# Patient Record
Sex: Female | Born: 1975 | Race: Black or African American | Hispanic: No | Smoking: Never smoker
Health system: Southern US, Community
[De-identification: ages and names within clinical notes are randomized; demographics above are authoritative.]

## PROBLEM LIST (undated history)

## (undated) DIAGNOSIS — I2699 Other pulmonary embolism without acute cor pulmonale: Secondary | ICD-10-CM

## (undated) DIAGNOSIS — M329 Systemic lupus erythematosus, unspecified: Secondary | ICD-10-CM

## (undated) DIAGNOSIS — G8929 Other chronic pain: Secondary | ICD-10-CM

## (undated) DIAGNOSIS — K219 Gastro-esophageal reflux disease without esophagitis: Secondary | ICD-10-CM

## (undated) DIAGNOSIS — IMO0002 Reserved for concepts with insufficient information to code with codable children: Secondary | ICD-10-CM

## (undated) HISTORY — PX: PERICARDIAL WINDOW: SHX2213

---

## 2000-07-31 ENCOUNTER — Inpatient Hospital Stay (HOSPITAL_COMMUNITY): Admission: AD | Admit: 2000-07-31 | Discharge: 2000-07-31 | Payer: Self-pay | Admitting: Obstetrics & Gynecology

## 2000-08-02 ENCOUNTER — Encounter (INDEPENDENT_AMBULATORY_CARE_PROVIDER_SITE_OTHER): Payer: Self-pay

## 2000-08-02 ENCOUNTER — Inpatient Hospital Stay (HOSPITAL_COMMUNITY): Admission: AD | Admit: 2000-08-02 | Discharge: 2000-08-04 | Payer: Self-pay | Admitting: *Deleted

## 2006-01-25 ENCOUNTER — Encounter: Payer: Self-pay | Admitting: Internal Medicine

## 2006-01-26 ENCOUNTER — Ambulatory Visit: Payer: Self-pay | Admitting: *Deleted

## 2006-01-26 ENCOUNTER — Inpatient Hospital Stay (HOSPITAL_COMMUNITY): Admission: EM | Admit: 2006-01-26 | Discharge: 2006-02-03 | Payer: Self-pay | Admitting: Emergency Medicine

## 2006-01-26 ENCOUNTER — Ambulatory Visit: Payer: Self-pay | Admitting: Oncology

## 2006-01-26 ENCOUNTER — Ambulatory Visit: Payer: Self-pay | Admitting: Pulmonary Disease

## 2006-01-26 ENCOUNTER — Encounter: Payer: Self-pay | Admitting: Cardiology

## 2006-01-27 ENCOUNTER — Encounter (INDEPENDENT_AMBULATORY_CARE_PROVIDER_SITE_OTHER): Payer: Self-pay | Admitting: Specialist

## 2006-01-27 ENCOUNTER — Encounter (INDEPENDENT_AMBULATORY_CARE_PROVIDER_SITE_OTHER): Payer: Self-pay | Admitting: *Deleted

## 2006-02-12 ENCOUNTER — Encounter
Admission: RE | Admit: 2006-02-12 | Discharge: 2006-02-12 | Payer: Self-pay | Admitting: Thoracic Surgery (Cardiothoracic Vascular Surgery)

## 2006-02-13 ENCOUNTER — Ambulatory Visit: Payer: Self-pay | Admitting: Internal Medicine

## 2009-03-05 ENCOUNTER — Ambulatory Visit (HOSPITAL_COMMUNITY): Admission: RE | Admit: 2009-03-05 | Discharge: 2009-03-05 | Payer: Self-pay | Admitting: Internal Medicine

## 2010-08-13 ENCOUNTER — Emergency Department (HOSPITAL_COMMUNITY)
Admission: EM | Admit: 2010-08-13 | Discharge: 2010-08-13 | Payer: Self-pay | Source: Home / Self Care | Admitting: Emergency Medicine

## 2010-08-14 ENCOUNTER — Emergency Department (HOSPITAL_COMMUNITY)
Admission: EM | Admit: 2010-08-14 | Discharge: 2010-08-14 | Payer: Self-pay | Source: Home / Self Care | Admitting: Emergency Medicine

## 2010-08-14 LAB — BASIC METABOLIC PANEL
BUN: 7 mg/dL (ref 6–23)
CO2: 26 mEq/L (ref 19–32)
Calcium: 9.1 mg/dL (ref 8.4–10.5)
Chloride: 105 mEq/L (ref 96–112)
Creatinine, Ser: 0.82 mg/dL (ref 0.4–1.2)
GFR calc Af Amer: >60 mL/min (ref >60)
GFR calc non Af Amer: >60 mL/min (ref >60)
Glucose, Bld: 87 mg/dL (ref 70–99)
Potassium: 3.6 mEq/L (ref 3.5–5.1)
Sodium: 139 mEq/L (ref 135–145)

## 2010-09-01 ENCOUNTER — Encounter: Payer: Self-pay | Admitting: Thoracic Surgery (Cardiothoracic Vascular Surgery)

## 2010-10-21 LAB — DIFFERENTIAL
Eosinophils Relative: 0 % (ref 0–5)
Lymphocytes Relative: 8 % — ABNORMAL LOW (ref 12–46)
Lymphs Abs: 2 10*3/uL (ref 0.7–4.0)
Monocytes Absolute: 0.7 10*3/uL (ref 0.1–1.0)
Monocytes Relative: 3 % (ref 3–12)
WBC Morphology: INCREASED

## 2010-10-21 LAB — CK TOTAL AND CKMB (NOT AT ARMC)
CK, MB: 0.3 ng/mL (ref 0.3–4.0)
Relative Index: INVALID (ref 0.0–2.5)
Total CK: 47 U/L (ref 7–177)

## 2010-10-21 LAB — CULTURE, BLOOD (ROUTINE X 2)
Culture: NO GROWTH
Culture: NO GROWTH

## 2010-10-21 LAB — URINALYSIS, ROUTINE W REFLEX MICROSCOPIC
Bilirubin Urine: NEGATIVE
Ketones, ur: NEGATIVE mg/dL
Nitrite: NEGATIVE
Specific Gravity, Urine: 1.02 (ref 1.005–1.030)
pH: 6 (ref 5.0–8.0)

## 2010-10-21 LAB — COMPREHENSIVE METABOLIC PANEL
Albumin: 3.5 g/dL (ref 3.5–5.2)
Alkaline Phosphatase: 65 U/L (ref 39–117)
BUN: 9 mg/dL (ref 6–23)
Chloride: 101 mEq/L (ref 96–112)
Glucose, Bld: 89 mg/dL (ref 70–99)
Potassium: 3.4 mEq/L — ABNORMAL LOW (ref 3.5–5.1)
Total Bilirubin: 0.7 mg/dL (ref 0.3–1.2)

## 2010-10-21 LAB — CBC
HCT: 35.4 % — ABNORMAL LOW (ref 36.0–46.0)
Hemoglobin: 11.6 g/dL — ABNORMAL LOW (ref 12.0–15.0)
MCV: 84.3 fL (ref 78.0–100.0)
RBC: 4.2 MIL/uL (ref 3.87–5.11)
WBC: 25.5 10*3/uL — ABNORMAL HIGH (ref 4.0–10.5)

## 2010-10-21 LAB — URINE CULTURE
Colony Count: NO GROWTH
Culture  Setup Time: 201201031302
Culture: NO GROWTH

## 2010-10-21 LAB — LACTIC ACID, PLASMA: Lactic Acid, Venous: 1.5 mmol/L (ref 0.5–2.2)

## 2010-10-21 LAB — PREGNANCY, URINE: Preg Test, Ur: NEGATIVE

## 2010-12-27 NOTE — Op Note (Signed)
NAMEBRYAR, RENNIE NO.:  [id number]   MEDICAL RECORD NO.:  [id number]          PATIENT TYPE:  INP   LOCATION:  2918                         FACILITY:  [facility]   PHYSICIAN:  Salvatore Decent. Dorris Fetch, M.D.DATE OF BIRTH:  [birth year]   DATE OF PROCEDURE:  01/27/2006  DATE OF DISCHARGE:                                 OPERATIVE REPORT   PREOPERATIVE DIAGNOSIS:  Pericardial effusion.   POSTOPERATIVE DIAGNOSIS:  Pericardial effusion.   PROCEDURE:  Subxiphoid pericardial window.   SURGEON:  Salvatore Decent. Dorris Fetch, M.D.   ASSISTANT:  Leodis Sias, RNFA   ANESTHESIA:  General.   FINDINGS:  No hemodynamic changes with fluid drainage.  Pericardium was  relatively normal appearing.  Clear serous fluid, 400 mL drained.   CLINICAL NOTE:  Elizabeth Baxter is a 35 year old female with a history of lupus  who was admitted with shortness of breath and chest discomfort.  A CT was  done to rule out pulmonary embolism and showed a large pericardial effusion.  She subsequently had an echocardiogram which showed a large pericardial  effusion with some suggestion of early tamponade physiology.  The patient  did not have hemodynamic signs of pericardial tamponade.  She also had a  question of pneumonia on her CT scan.  The patient was referred for  subxiphoid pericardial window for diagnostic and therapeutic purposes.  The  indications, benefits, benefits and alternatives were discussed with the  patient and her family.  She understood and accepted the risks of surgery  and agreed to proceed.   OPERATIVE NOTE:  Elizabeth Baxter was brought to the operating room on the morning  of January 27, 2006.  The patient had been intubated the night before.  She was  tachycardic but otherwise hemodynamically stable.  She was already on  fentanyl and Versed drips and anesthesia was monitored by the anesthesia  personnel.  The patient was on vancomycin and meropenem.  After ensuring  adequate general  anesthesia, the chest and abdomen were prepped and draped  in the usual fashion.  Incision was made over the xiphoid process  approximately 8 cm in length.  It was carried down through skin and  subcutaneous tissue.  Hemostasis was achieved with electrocautery.  The  xiphoid process was identified.  It was primarily cartilaginous and a  portion of it was excised to improve exposure.  A lymph node was  encountered.  This was edematous but otherwise unremarkable.  It was sent  for both pathology and cultures.  The diaphragmatic reflection of the  pericardium was grasped with a Babcock clamp and retracted.  There was upper  retraction placed on the remainder of the xiphoid process.  The pericardium  was exposed.  The pericardium was incised.  Four-hundred mL of clear serous  fluid was evacuated.  There was no change in the patient's hemodynamics,  either blood pressure or heart rate with drainage of fluid.  An  approximately 2 cm square section of the pericardium was excised and sent  for both cultures and pathology.  The fluid drained was sent for cultures,  serologies, cytologies,  cell counts and chemistries.  The Yankauer suction  tip was placed into the pericardium and gently probed throughout the entire  pericardium to ensure there were no loculated effusions.  A 32-French Blake  drain was placed into the pericardium along the diaphragmatic surface and  brought out through a separate incision and secured with a #1 silk suture.  The incisions was close in standard fashion.  A zero Vicryl suture was used  for the fascia, 2-0 Vicryl for the subcutaneous tissue and a 3-0 Vicryl for  the subcuticular suture.  All sponge, needle and instrument counts were  correct at the end of the procedure.  The patient remained in fair condition  throughout the procedure with no significant changes.  She was taken from  the operating room to the CCU intubated and in fair condition.            ______________________________  Salvatore Decent Dorris Fetch, M.D.     SCH/MEDQ  D:  01/27/2006  T:  01/27/2006  Job:  [id number]   cc:   Olga Millers, M.D. [facility]  [address]  [address]  [place]  [place] [address]   Danice Goltz, M.D. [facility]  [address] [place], [place] [address]

## 2010-12-27 NOTE — Consult Note (Signed)
NAMEANAVI, BRANSCUM NO.:  [id number]   MEDICAL RECORD NO.:  [id number]          PATIENT TYPE:  INP   LOCATION:  2918                         FACILITY:  [facility]   PHYSICIAN:  Leighton Roach. Truett Perna, M.D. DATE OF BIRTH:  1976/04/11   DATE OF CONSULTATION:  01/26/2006  DATE OF DISCHARGE:                                   CONSULTATION   HEMATOLOGY CONSULTATION NOTE.   PATIENT IDENTIFICATION:  Ms. Gammell is a 35 year old admitted with  respiratory distress and chest pain.  We were consulted to evaluate anemia  and a positive antibody screen.   HISTORY OF PRESENT ILLNESS:  Ms. Graciano reports a history of systemic lupus  erythematosus, dating to 2001.  She is maintained on prednisone and  Plaquenil for treatment of arthralgias and skin disease.   She presented to the emergency room on June 18 with chest pain progressing  over a few days.   A chest x-ray in the emergency room revealed changes of congestive heart  failure versus infection.  A CT scan of the chest was remarkable for  pulmonary edema, bilateral effusions, multifocal airspace disease, and a  large pericardial effusion.   An echocardiogram confirmed a pericardial effusion with early RV diastolic  collapse.  She was seen by Dr. Dorris Fetch and is scheduled to undergo a  pericardial window procedure on June 19.   The admission laboratory screen was remarkable for a hemoglobin of 8.8,  platelets 524,000, white count 14,000, ANC 12.4, MCV 82.3, and the RDW  returned at 18.3%.   A type and screen was sent to the blood bank.  This confirmed the blood type  to be O positive.  The antibody screen was positive.  A DAT was positive for  a warm antibody and complement.   She reports no history of anemia.  She has taken iron in the past.   PAST MEDICAL HISTORY:  1.  SLE.  2.  G2, P1, 1 miscarriage.   PAST SURGICAL HISTORY:  None.   ALLERGIES:  TRAMADOL causes pruritus.   MEDICATIONS ON ADMISSION:  1.  Sudafed  p.r.n.  2.  Hydrochlorothiazide 25 mg daily.  3.  Naprosyn 500 mg b.i.d.  4.  Prednisone 10 mg daily.  5.  Plaquenil 100 mg daily.   FAMILY HISTORY:  Noncontributory.   SOCIAL HISTORY:  She lives in [place].  She is in [place] visiting  family.  She denies tobacco and alcohol use.  She denies risk factors for  HIV and hepatitis.  She has no transfusion history.   REVIEW OF SYSTEMS:  CONSTITUTIONAL:  She reports a fever for several days  prior to hospital admission.  She reports anorexia for several days.  Prior  to the past few days, she had a good appetite.  She denies weight loss.  RESPIRATORY:  She has a cough and dyspnea.  CARDIAC:  She presented with  substernal chest pain.  GU:  Negative.  GI:  Negative.  NEUROLOGIC:  Negative.  SKIN:  Stable rash over the trunk and extremities.  She relates  the rash to lupus.  PHYSICAL EXAMINATION:  VITAL SIGNS:  Temperature 98.1, pressure 110/70,  pulse 120, oxygen saturation 91% on a 50% mask.  HEENT:  The sclerae are anicteric.  No thrush.  LUNGS:  Diffuse inspiratory rales throughout the posterior lung fields and  the upper anterior lung fields.  CARDIAC:  Tachycardia.  Regular rhythm.  ABDOMEN:  No hepatosplenomegaly.  EXTREMITIES:  No edema.  LYMPH NODES:  There are shotty bilateral cervical and inguinal nodes; 1-  cm, mobile, bilateral axillary nodes.  SKIN:  There are raised, hyperpigmented lesions at the dorsum of the  extremities.  There is confluent erythema at the right abdomen and low  anterior chest.   Labs from June 18:  PT 14.5, PTT 35, D-dimer 3.34.  Sodium 132, BUN 13,  creatinine 0.9, bilirubin 0.7, AST 84, ALT 25, total protein 8.3, albumin  2.4, calcium 8.2.  LDH 530. CPK 1440.  C3 60, C4 11.  TSH 4.1.   Review of the peripheral blood smear:  There is a marked leukocytosis.  The  majority of the neutrophils are mature polys.  There are numerous band forms  and occasional myelocytes.  The polychromasia is  increased.  There are a few  teardrop forms and ovalocytes and microcytes.  No spherocytes.  The  platelets appear normal in number.   IMPRESSION:  1.  History of systemic lupus erythematosus.  2.  Large pericardial effusion.  3.  Bilateral parenchymal lung infiltrates versus edema.  4.  Anemia.  5.  Positive direct antiglobulin testing for IgG and complement.  6.  Neutrophilia.  7.  Chest pain.  8.  Skin changes secondary to systemic lupus erythematosus.   Ms. Martinez is admitted with a large pericardial effusion and hypoxia related  to pulmonary infiltrates versus edema.   She has anemia.  The anemia is most likely related to chronic disease or  iron deficiency.   The differential diagnosis includes an autoimmune hemolytic anemia.  I do  not see peripheral blood smear changes (spherocytes) to suggest ongoing  hemolysis.   The positive DAT for IgG and complement is likely related to systemic  inflammation in the setting of SLE.  The positive DAT does not necessarily  indicate significant hemolysis.   She can be transfused with ABO/Rh-compatible blood as needed.  This is  generally safe, withstanding the small chance of a transfusion reaction to  an unrecognized alloantibody.   The lymphadenopathy on the chest CT and physical exam is most likely related  to systemic inflammation in the setting of SLE.  I have a low suspicion for  a hematopoietic malignancy at present.   RECOMMENDATIONS:  1.  Transfuse least incompatible RBCs as indicated per cardiology/CVTS.  2.  Management of the symptomatic pericardial effusion per CVTS.  3.  Consider broad-spectrum antibiotics, including treatment for atypical      infections, and pulmonary consultation for bronchoscopy/pulmonary biopsy      pending results from the planned pericardial window procedure.  4.  Management of SLE per rheumatology. 5.  Additional evaluation of the anemia, to include serum iron studies, a      reticulocyte  count, and serum haptoglobin level.           ______________________________  Leighton Roach. Truett Perna, M.D.     GBS/MEDQ  D:  01/26/2006  T:  01/27/2006  Job:  [id number]   cc:   Olga Millers, M.D. [facility]  [address]  [address]  [place]  [place] [address]   Salvatore Decent.  Dorris Fetch, M.D.  [address]  [place]  [place] [address]

## 2010-12-27 NOTE — H&P (Signed)
Elizabeth Baxter, Elizabeth Baxter NO.:  [id number]   MEDICAL RECORD NO.:  [id number]          PATIENT TYPE:  INP   LOCATION:  2011                         FACILITY:  [facility]   PHYSICIAN:  Denyse Amass, MD DATE OF BIRTH:  [birth year]   DATE OF ADMISSION:  01/26/2006  DATE OF DISCHARGE:                                HISTORY & PHYSICAL   CHIEF COMPLAINT:  Chest pain.   HISTORY OF PRESENT ILLNESS:  A 35 year old female with lupus who presents  with a 24-hour sensation of somebody stepping on her chest when she lays  down.  Is improved in an upright position.  Her symptoms started three days  ago.  However, it was initially intermittent.  She noted chills, called her  primary care physician who started antibiotics for presumed upper  respiratory tract infection.  Some of her symptoms progressed until the  above described sensation for the last 24 hours.  She denies syncope.  She  has occasional complications.  She denies every having these symptoms  before.   PAST MEDICAL HISTORY:  1.  Lupus diagnosed in 2001, currently treated with prednisone and      Plaquenil.  2.  She has been pregnant twice, the most recent in 2005, resulting in a      miscarriage.  She has one 48-year-old daughter.   PAST SURGICAL HISTORY:  None.   ALLERGIES:  TRAMADOL causes itching.   MEDICATIONS:  1.  Pseudoephedrine p.r.n.  2.  Hydrochlorothiazide 25 mg.  3.  Naprosyn 500 mg.  4.  Prednisone 10 mg.  5.  Plaquenil 100 mg.  6.  In the emergency room, she received ceftriaxone, Dilaudid and Lasix.   SOCIAL HISTORY:  She lives on [place].  She is here in [place] visiting  her parents.   FAMILY HISTORY:  Unremarkable.   SOCIAL HISTORY:  She denies tobacco, alcohol or IV drug abuse.   REVIEW OF SYSTEMS:  Otherwise, negative besides as mentioned in the history  and present illness.   PHYSICAL EXAMINATION:  VITAL SIGNS:  Temperature 97.9, pulse 112,  respirations 18, blood pressure  112/61, pulse oximetry 94% on room air.  A  pulsus paradoxus was measured at 10 mmHg.  GENERAL APPEARANCE:  She is age appropriate.  She looks uncomfortable.  HEENT:  Normocephalic, atraumatic.  Pupils equal, round and reactive to  light and accommodation.  Extraocular motion intact.  __________clear.  Mucous membranes are dry.  Dentition is fair.  NECK:  No evidence of meningismus.  Jugular venous pulsations are elevated  past the level of the earlobe.  She has sharp A and B waves with prominent X  and Y descents.  There is shiny axillary lymphadenopathy bilaterally.  CARDIOVASCULAR:  A quite precordium with an appropriately placed PMI and no  thrills or heaves.  S1 and S2 are regular, mildly tachycardic at  approximately 110 beats per minute.  S4 is appreciated.  Pulses in the  carotid, radial and dorsalis pedis positions are 2+ in intensity and normal  wave form.  LUNGS:  Crackles bilaterally and bilateral dullness to percussion, right  greater than left.  SKIN:  No rashes or lesions.  ABDOMEN:  Soft and nontender.  No rebound or guarding.  Liver is within  normal limits.  RECTAL:  Not performed.  EXTREMITIES:  No cyanosis, clubbing or edema.  No lesions or petechiae are  noted.  MUSCULOSKELETAL:  No joint deformities and no effusions.  NEUROLOGICAL:  The patient is alert and oriented x3 with cranial nerves II-  XII grossly intact.  Strength is 5/5 in all extremities and all axial  groups.  Normal sensation throughout with normal cerebellar function.   STUDIES:  Chest x-ray is pending.  A CT scan was performed at [facility],  which per report, showed pulmonary edema and a large pericardial effusion.  EKG here shows sinus tachycardia at 127 beats per minute with low voltage in  the limb leads and nonspecific ST changes.   LABORATORY DATA:  White count 12.5, hematocrit 28.1, platelets 576,000.  Sodium 133, potassium 4.1, chloride 102, bicarbonate 25, BUN 14, creatinine  0.9,  glucose 103, AST 92, ALT 29, alk-phos 69.  First troponin 0.17, second  troponin 0.3, initial CK 1397, MB 15.7, ABG shows pH 7.41, PCO2 37, PO2 66,  bicarbonate 23.4, saturations 91.4%, D. dimer elevated at 3.34.   ASSESSMENT/PLAN:  This is a 35 year old woman with a history of SLE who  presents with a three day history of chest pain relieved by standing  upright, pericardial effusion noted on CT scan, leukocytosis, tachycardia  with low voltage and EKG.  Her blood pressure remains stable and her  tachycardia has improved with analgesia to approximately 100-105.  As  instructed, the patient is having a lupus flare complicated by pericarditis,  perhaps with myocardial involvement as well.  While the patient clearly has  a pericardial effusion, she has equivocal and clinical findings of tamponade  pulsus paradoxus that is mildly increased.  Her tachycardia is improving.  However, she does have an oxygen requirement.  We will admit the patient to  the hospital.  We will get an echocardiogram shortly to assess for the  hemodynamic significance of the effusion that has been observed.  Will  initiate IV steroids, IV analgesia.  Currently there is no evidence of  tamponade.  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      Denyse Amass, MD  Electronically Signed     DBH/MEDQ  D:  01/26/2006  T:  01/26/2006  Job:  [id number]

## 2010-12-27 NOTE — Procedures (Signed)
NAMEMACKENZEY, CROWNOVER NO.:  [id number]   MEDICAL RECORD NO.:  [id number]          PATIENT TYPE:  EMS   LOCATION:  ED                            FACILITY:  [facility]   PHYSICIAN:  Edward L. Juanetta Gosling, M.D.DATE OF BIRTH:  [birth year]   DATE OF PROCEDURE:  DATE OF DISCHARGE:                                EKG INTERPRETATION   The rhythm is sinus tachycardia with a rate of about 130.  There is possible  left atrial enlargement.  There is poor R-wave progression across the  precordium which could indicate a previous infarction or could be related to  pulmonary disease, etc.  There are T-wave abnormalities which are  nonspecific.  Abnormal electrocardiogram.      Oneal Deputy. Juanetta Gosling, M.D.  Electronically Signed     ELH/MEDQ  D:  01/26/2006  T:  01/26/2006  Job:  [id number]

## 2010-12-27 NOTE — Discharge Summary (Signed)
[facility]  Patient:    Elizabeth Baxter, Elizabeth Baxter                         MRN: [id number] Adm. Date:  [id number] Disc. Date: 08/04/00 Attending:  Michaelle Copas Dictator:   Kinnie Scales. Reed Breech, M.D. CC:         Nettie Elm, M.D. [facility], [place], [place]   Discharge Summary  DISCHARGE DIAGNOSES:          1. Normal spontaneous vaginal deliver.                               2. Lupus.                               3. Positive group beta Streptococcus treated                                  with antibiotics.                               4. Positive Chlamydia treated with Suprax and                                  azithromycin.  DISCHARGE MEDICATIONS:        1. Baby aspirin 81 mg p.o. q.d.                               2. Prenatal vitamins, one p.o. q.d.                               3. ______ 600 mg, one p.o. q.6h. p.r.n. pain.                               4. Birth control in the form of Depo-Provera.  ADMISSION HISTORY AND PHYSICAL:                 The patient is a pleasant 35 year old African-American female, gravida 1, para 0 at 36-5/7 weeks by last menstrual period and ______ by a 17-week ultrasound.  She presents with increased uterine activity and increased intensity of contractions, but no rupture of membranes on December 23.  The patient has been noted in the past to be positive ANA, positive VDRL, but negative STA.  DAILY MEDS:                   1. Heparin 5000 units subcu b.i.d.                               2. Baby aspirin.                               3. Iron.  4. Prenatal vitamins.  PHYSICAL EXAMINATION:         (In the emergency room).  ABDOMEN:                      The patient was having contractions every 3-1/2 to 4 minutes.  The patient had positive decelerations and fetal heart rate of 140-145.  VITAL SIGNS:                  Temperature 97.5, blood pressure 121/81,  pulse 87.  PELVIC:                       Her cervical exam was remarkable for a 5 cm cervix, 70% effacement, -2 station and vertex presentation.  PLAN:                         The patient was admitted to labor and delivery for expectant management.  The patient was noted to have positive Chlamydia. She was treated with azithromycin p.o. x 1 along with Suprax.  The patient will undergo Pitocin augmentation if contractions slowed.  HOSPITAL COURSE:              1. OB.  The patient had an epidural placement. The patient was noted to have some variables as well as some deep variables and, thus, an IUPC was placed and amnio infusion started.  The patient then delivered, at 2307 on August 02, 2000, a healthy, viable female infant.  No lacerations were noted.  Estimated blood loss was less than 200 cc.  The baby and the mother were in stable condition.  Postpartum, the patients hemoglobin was 9.5.                                2. Contraception.  The patient would like Depo-Provera prior to discharge.  This will be given IM.                                3. Nutrition for baby.  The mother started breast feeding.  However, it was worried that the baby was not getting adequate nutrition, and it was decided to switch to bottle feeding.  The baby has done well with this.  DISCHARGE HISTORY AND PHYSICAL:                 The patient is doing well today.  She is stable to ambulate in the hall.  Some abdominal discomfort that resolves with pain medications.  DISPOSITION:                  The patient reports that she will follow up with her primary obstetrician in [place], [place], as well as her rheumatologist, as there is the possibility of a flare of lupus postpartum. The patient underwent a routine postpartum course and will be discharged today, August 04, 2000 in stable condition. DD:  08/04/00 TD:  08/04/00 Job: [id number]

## 2010-12-27 NOTE — Discharge Summary (Signed)
Elizabeth Baxter, ALCINDOR NO.:  [id number]   MEDICAL RECORD NO.:  [id number]          PATIENT TYPE:  INP   LOCATION:  5734                         FACILITY:  [facility]   PHYSICIAN:  Shan Levans, M.D. LHCDATE OF BIRTH:  18-Apr-1976   DATE OF ADMISSION:  01/26/2006  DATE OF DISCHARGE:  02/03/2006                                 DISCHARGE SUMMARY   DISCHARGE DIAGNOSES:  1.  Systemic lupus erythematosus (SLE) flare with pleuro/pericarditis and      pneumonitis with positive response to high-dose Solu-Medrol.  2.  Acute respiratory failure with ventilator-dependent respiratory failure      secondary to #1 (resolved).  3.  Anemia.  4.  Hyperglycemia.   LABORATORY DATA:  February 03, 2006:  Sodium 135, potassium 3.9, chloride 109,  CO2 26, glucose 116, BUN 6, creatinine 0.6, alkaline phosphatase 38, AST 52,  ALT 30, albumin 2.2.  February 03, 2006:  Phosphorous 3.3.  February 03, 2006:  Magnesium 2.2.  February 03, 2006:  White blood cells 18.2, hemoglobin 8.7,  hematocrit 26.1, platelets 758.  January 26, 2006:  Blood cultures x2 were  negative (final data).  January 27, 2006:  ANA by IFA, IgG finding high degree  of nonspecific fluorescence observed (indeterminate).  January 27, 2006:  __________ with no growth to date.  January 27, 2006:  CMV PCR of urine.  Results not detected.   RADIOLOGY:  February 02, 2006:  Chest x-ray:  Cardiomegaly with improved  perihilar congestion and resolving bibasilar air space disease.   BRIEF HISTORY:  This is a 35 year old African-American female who was  diagnosed with SLE about six years ago.  She presented in transfer from  [facility] on January 26, 2006 with a diagnosis of pericardial  effusion and chest pain.  She had had progressive increased FiO2  requirements since her time of admission.  Prior to being seen in the  hospital she reported approximately two-week history of dyspnea on exertion  with productive cough with greenish colored sputum.   Subjectively complaint  of questionable fevers.  About one to two days prior to admission started  feeling heaviness in chest, especially when she would lie down.  She was  taking p.o. steroids and Plaquenil in the outpatient setting.  She had  apparently been treated with one round of antibiotics by her primary care  physician in [place] prior to admission.  Pulmonary critical care team  was asked to see in consultation and eventually picked her up on primary  care service from the cardiology team in regards to whether or not her  dyspnea was secondary to inflammation versus an acute infective state.   HOSPITAL COURSE:  #1 - SLE FLARE WITH PLEURO/PERICARDITIS AND PNEUMONITIS  WITH POSITIVE RESPONSE TO HIGH-DOSE SOLU-MEDROL:  Ms. Oguinn was actually  admitted to the coronary intensive care under the primary cardiology service  and then later to the pulmonary critical care service.  She underwent  pericardial window with biopsy on January 27, 2006 with fluids for cytology and  diagnostic PCR, ADA, and cultures sent.  Diagnostic evaluation by  rheumatology and Dr. Delford Field demonstrated no evidence of an opportunistic  infection or infection in general.  Prior to this she was empirically  covered with vancomycin and Primaxin.  Ms. Dearing did have transient  hypotension primarily from probable tamponade physiology from pericardial  effusion.  This was clinically improved after pericardial window and she  slowly continued to improve after this.  Dr. Phylliss Bob was consulted in the  inpatient setting and it was his impression as well that this was indeed  secondary to SLE flare.  Ms. Hornik was treated with high-dose steroids with  dramatic improvement over the course of her hospitalization.  On February 03, 2006 with decreased dyspnea, decreased cough, and no chest pain is currently  ambulating in the hall.  Room air saturations in the 100% range.  She will  be followed once by Dr. Phylliss Bob in the outpatient  setting and then be referred  back to her primary rheumatologist, Dr. Claudette Laws in [place] for further  management of her SLE disease.  From a pneumonitis standpoint as well as  pericarditis standpoint she was see Dr. Sherene Sires in follow-up next week for  reevaluation of chest x-ray and then Dr. Dorris Fetch as well next week for  follow-up pericardial window and reevaluation of postoperative films as  well.  At that time she will also need sutures removed from anterior  mediastinal chest tube placement.   #2 - RESPIRATORY FAILURE SECONDARY TO SLE FLARE AND PNEUMONITIS:  Resolved  after high-dose Solu-Medrol with 1 g IV x3 days now on prednisone fixed dose  at 60 mg daily until directed otherwise by rheumatology.  She did require  endotracheal intubation for her acute renal failure.  She was intubated on  January 26, 2006 and successfully extubated on January 29, 2006 and since that  time has been weaned to room air support.  Follow-up particularly with Dr.  Sherene Sires in the outpatient setting for follow-up of pneumonitis on chest x-ray.   #3 - ANEMIA:  Treated inpatient on epoetin and iron.  Upon time of discharge  her hemoglobin is currently 8.7.  Her lowest hemoglobin during this  hospitalization was measured at 7.5 on January 29, 2006.  She will be sent home  on oral iron with follow-up in the outpatient setting.   #4 - HYPERGLYCEMIA SECONDARY TO HIGH-DOSE SOLU-MEDROL:  Much improved on  oral prednisone and currently in 158-188 region.  This was managed with  sliding scale insulin in inpatient setting, will need to be reevaluated by  her primary care physician with a question of insulin in the outpatient  setting.  However, she will not be sent home on insulin at this time given  the short duration of high-dose prednisone anticipated.   DISCHARGE INSTRUCTIONS:   MEDICATIONS:  1.  Ferrous sulfate 325 mg tablet one tablet three times a day.  2.  Pepcid 20 mg tablet one tablet twice a day. 3.   Prednisone 10 mg tablet six tablets daily.   FOLLOW-UP APPOINTMENT:  Dr. Estill Bakes on February 12, 2006 at 8:45 a.m.  Dr.  Charlett Lango on February 12, 2006 at 12 noon.  Dr. Sandrea Hughs on February 13, 2006 at 10:50 a.m.  She will have follow-up chest x-ray with Dr. Sherene Sires on  February 13, 2006 for follow-up of pneumonitis.   PHYSICAL EXAMINATION UPON TIME OF DISCHARGE:  VITAL SIGNS:  Temperature  98.3, heart rate 87, respirations 19-20, saturations 98% on room air, blood  pressure 113/69.  HEENT:  Right  anterior neck CVL insertion site well healed.  PULMONARY:  Faint posterior rales.  CARDIAC:  Regular rate and rhythm.  CHEST:  Anterior mediastinal chest tube site with sutures intact.  Incision  from pericardial window well approximated without drainage.  ABDOMEN:  Soft, nontender.  EXTREMITIES:  Without edema.  Chest x-ray clearing with decreased infiltrates bilaterally.  NEUROLOGIC:  Grossly intact.   DISPOSITION:  Currently ready for discharge for a follow-up with the above-  noted physicians.   ACTIVITY:  As tolerated.  She will have outpatient physical therapy as well  as follow-up in the outpatient setting.      Anders Simmonds, N.P. [facility]      Shan Levans, M.D. [facility]  Electronically Signed    PB/MEDQ  D:  02/03/2006  T:  02/03/2006  Job:  [id number]   cc:   Salvatore Decent. Dorris Fetch, M.D.  [address]  [place]  [place] [address]   Charlaine Dalton. Sherene Sires, M.D. [facility]  [address]  [place]  [place] [address]   Claudette Laws, M.D.  [facility]  [place], [place]

## 2011-06-21 ENCOUNTER — Emergency Department (HOSPITAL_COMMUNITY)

## 2011-06-21 ENCOUNTER — Encounter: Payer: Self-pay | Admitting: *Deleted

## 2011-06-21 ENCOUNTER — Emergency Department (HOSPITAL_COMMUNITY)
Admission: EM | Admit: 2011-06-21 | Discharge: 2011-06-22 | Disposition: A | Discharge status: Home / Self Care | Attending: Emergency Medicine | Admitting: Emergency Medicine

## 2011-06-21 DIAGNOSIS — R059 Cough, unspecified: Secondary | ICD-10-CM | POA: Insufficient documentation

## 2011-06-21 DIAGNOSIS — Z79899 Other long term (current) drug therapy: Secondary | ICD-10-CM | POA: Insufficient documentation

## 2011-06-21 DIAGNOSIS — R0982 Postnasal drip: Secondary | ICD-10-CM | POA: Insufficient documentation

## 2011-06-21 DIAGNOSIS — J40 Bronchitis, not specified as acute or chronic: Secondary | ICD-10-CM | POA: Insufficient documentation

## 2011-06-21 DIAGNOSIS — M549 Dorsalgia, unspecified: Secondary | ICD-10-CM | POA: Insufficient documentation

## 2011-06-21 DIAGNOSIS — M25519 Pain in unspecified shoulder: Secondary | ICD-10-CM | POA: Insufficient documentation

## 2011-06-21 DIAGNOSIS — R0602 Shortness of breath: Secondary | ICD-10-CM | POA: Insufficient documentation

## 2011-06-21 DIAGNOSIS — R05 Cough: Secondary | ICD-10-CM | POA: Insufficient documentation

## 2011-06-21 DIAGNOSIS — M329 Systemic lupus erythematosus, unspecified: Secondary | ICD-10-CM | POA: Insufficient documentation

## 2011-06-21 HISTORY — DX: Reserved for concepts with insufficient information to code with codable children: IMO0002

## 2011-06-21 HISTORY — DX: Systemic lupus erythematosus, unspecified: M32.9

## 2011-06-21 MED ORDER — ALBUTEROL SULFATE HFA 108 (90 BASE) MCG/ACT IN AERS: 2.0000 | INHALATION_SPRAY | RESPIRATORY_TRACT | Status: DC | PRN

## 2011-06-21 MED ORDER — NAPROXEN 500 MG PO TABS: 500.0000 mg | ORAL_TABLET | Freq: Two times a day (BID) | ORAL | Status: DC

## 2011-06-21 MED ORDER — AZITHROMYCIN 250 MG PO TABS: 250.0000 mg | ORAL_TABLET | Freq: Every day | ORAL | Status: DC

## 2011-06-21 MED ORDER — METHOCARBAMOL 500 MG PO TABS: 500.0000 mg | ORAL_TABLET | Freq: Two times a day (BID) | ORAL | Status: DC | PRN

## 2011-06-21 MED ORDER — KETOROLAC TROMETHAMINE 60 MG/2ML IM SOLN
60.0000 mg | Freq: Once | INTRAMUSCULAR | Status: AC
  Administered 2011-06-22: 60 mg via INTRAMUSCULAR
  Filled 2011-06-21: qty 2

## 2011-06-21 MED ORDER — OXYCODONE-ACETAMINOPHEN 5-325 MG PO TABS
2.0000 | ORAL_TABLET | Freq: Once | ORAL | Status: AC
  Administered 2011-06-22: 2 via ORAL
  Filled 2011-06-21: qty 2

## 2011-06-21 NOTE — ED Notes (Signed)
Pt is here with right upper; mid back pain for the past 3 days.  Reports hurts to take a deep breath

## 2011-06-21 NOTE — ED Provider Notes (Signed)
History     CSN: [id number] Arrival date & time: 06/21/2011  3:58 PM   First MD Initiated Contact with Patient 06/21/11 2340      Chief Complaint  Patient presents with  . Back Pain    (Consider location/radiation/quality/duration/timing/severity/associated sxs/prior treatment) HPI Comments: Patient is a 35 year old female with a history of lupus. She states that she has had 3 days of upper right back pain which is intermittent worse with movement and palpation and associated with shortness of breath and productive cough. She has had green phlegm with coughing approximately 3 weeks after having sinus drainage. Symptoms are mild when she lays still and much worse when she moves around. She denies fevers, nausea or vomiting, drop in appetite, swelling in the legs.  Patient is a 35 y.o. female presenting with back pain. The history is provided by the patient and a relative.  Back Pain  This is a new problem. The current episode started more than 2 days ago. The problem occurs hourly. The problem has been gradually worsening. The pain is associated with no known injury. Pain location: Rhomboid area right back. The pain does not radiate. The symptoms are aggravated by bending, twisting and certain positions. Pertinent negatives include no chest pain, no fever, no headaches, no abdominal pain, no abdominal swelling and no weakness. Treatments tried: Oxycodone. The treatment provided no relief.    Past Medical History  Diagnosis Date  . Lupus     Past Surgical History  Procedure Date  . Pericardial window     in 2007    No family history on file.  History  Substance Use Topics  . Smoking status: Never Smoker   . Smokeless tobacco: Not on file  . Alcohol Use: No    OB History    Grav Para Term Preterm Abortions TAB SAB Ect Mult Living                  Review of Systems  Constitutional: Negative for fever.  Cardiovascular: Negative for chest pain.  Gastrointestinal: Negative  for abdominal pain.  Musculoskeletal: Positive for back pain.  Neurological: Negative for weakness and headaches.  All other systems reviewed and are negative.    Allergies  Tramadol  Home Medications   Current Outpatient Rx  Name Route Sig Dispense Refill  . ESOMEPRAZOLE MAGNESIUM 20 MG PO CPDR Oral Take 20 mg by mouth daily before breakfast.      . FENTANYL 75 MCG/HR TD PT72 Transdermal Place 1 patch onto the skin every 3 (three) days.      . FERROUS FUMARATE 325 (106 FE) MG PO TABS Oral Take 1 tablet by mouth.      Marland Kitchen HYDROXYCHLOROQUINE SULFATE 200 MG PO TABS Oral Take 200 mg by mouth 2 (two) times daily.      Marland Kitchen MYCOPHENOLATE MOFETIL 500 MG PO TABS Oral Take 1,500 mg by mouth 2 (two) times daily.      . OXYCODONE HCL PO Oral Take 30 mg by mouth 3 (three) times daily.      Marland Kitchen PREDNISONE 5 MG PO TABS Oral Take 5 mg by mouth daily.      Marland Kitchen VITAMIN D (ERGOCALCIFEROL) 50000 UNITS PO CAPS Oral Take 50,000 Units by mouth every 7 (seven) days.      . ALBUTEROL SULFATE HFA 108 (90 BASE) MCG/ACT IN AERS Inhalation Inhale 2 puffs into the lungs every 4 (four) hours as needed for wheezing or shortness of breath. 1 Inhaler 3  . AZITHROMYCIN  250 MG PO TABS Oral Take 1 tablet (250 mg total) by mouth daily. 500mg  PO day 1, then 250mg  PO days 205 6 tablet 0  . METHOCARBAMOL 500 MG PO TABS Oral Take 1 tablet (500 mg total) by mouth 2 (two) times daily as needed. 20 tablet 0  . NAPROXEN 500 MG PO TABS Oral Take 1 tablet (500 mg total) by mouth 2 (two) times daily. 30 tablet 0    BP 107/74  Pulse 82  Temp(Src) 98.2 F (36.8 C) (Oral)  Resp 20  SpO2 99%  Physical Exam  Nursing note and vitals reviewed. Constitutional: She appears well-developed and well-nourished. No distress.  HENT:  Head: Normocephalic and atraumatic.  Mouth/Throat: Oropharynx is clear and moist. No oropharyngeal exudate.  Eyes: Conjunctivae and EOM are normal. Pupils are equal, round, and reactive to light. Right eye exhibits  no discharge. Left eye exhibits no discharge. No scleral icterus.  Neck: Normal range of motion. Neck supple. No JVD present. No thyromegaly present.  Cardiovascular: Normal rate, regular rhythm, normal heart sounds and intact distal pulses.  Exam reveals no gallop and no friction rub.   No murmur heard. Pulmonary/Chest: Effort normal and breath sounds normal. No respiratory distress. She has no wheezes. She has no rales. She exhibits no tenderness.  Abdominal: Soft. Bowel sounds are normal. She exhibits no distension and no mass. There is no tenderness.  Musculoskeletal: Normal range of motion. She exhibits tenderness ( Tender to palpation in the right rhomboid area. No other tenderness including spinal tenderness or rib tenderness posteriorly). She exhibits no edema.  Lymphadenopathy:    She has no cervical adenopathy.  Neurological: She is alert. Coordination normal.  Skin: Skin is warm and dry. No rash noted. No erythema.  Psychiatric: She has a normal mood and affect. Her behavior is normal.    ED Course  Procedures (including critical care time)  Labs Reviewed - No data to display Dg Chest 2 View  06/21/2011  *RADIOLOGY REPORT*  Clinical Data: Right shoulder/back pain, shortness of breath  CHEST - 2 VIEW  Comparison: 08/13/2010  Findings: Cardiomegaly.  No frank interstitial edema.  Mild bibasilar atelectasis.  No pleural effusion or pneumothorax.  Visualized osseous structures are within normal limits.  IMPRESSION: Cardiomegaly with mild bibasilar atelectasis.  No frank interstitial edema.  Original Report Authenticated By: Charline Bills, M.D.     1. Back pain       MDM  Overall patient is well-appearing, chest x-ray is negative for acute infiltrate and vital signs reveal no fever, tachycardia or hypotension. Pulse ox reads 99% on room air. Due to patient's sinus symptoms and now with pulmonary symptoms with back pain we'll treat for bronchitis  with Zithromax and albuterol.  Patient has been given both oxycodone and intramuscular Toradol for her what appears to be musculoskeletal back pain and will followup with her family Dr.        Vida Roller, MD 06/21/11 [id number]

## 2011-06-21 NOTE — ED Notes (Signed)
Pt brought back to st8, ambulatory.  Pt continues to have lower back pain.  Helped position pt with pillow

## 2011-06-22 MED ORDER — AZITHROMYCIN 250 MG PO TABS: 250.0000 mg | ORAL_TABLET | Freq: Every day | ORAL | Status: AC

## 2011-06-22 MED ORDER — ALBUTEROL SULFATE HFA 108 (90 BASE) MCG/ACT IN AERS: 2.0000 | INHALATION_SPRAY | RESPIRATORY_TRACT | Status: DC | PRN

## 2011-06-22 MED ORDER — IBUPROFEN 800 MG PO TABS: 800.0000 mg | ORAL_TABLET | Freq: Three times a day (TID) | ORAL | Status: AC

## 2011-06-22 MED ORDER — METHOCARBAMOL 500 MG PO TABS: 500.0000 mg | ORAL_TABLET | Freq: Two times a day (BID) | ORAL | Status: AC | PRN

## 2011-06-22 MED ORDER — NAPROXEN 500 MG PO TABS: 500.0000 mg | ORAL_TABLET | Freq: Two times a day (BID) | ORAL | Status: DC

## 2011-06-22 MED ORDER — IBUPROFEN 600 MG PO TABS: 600.0000 mg | ORAL_TABLET | Freq: Four times a day (QID) | ORAL | Status: DC | PRN

## 2011-12-22 ENCOUNTER — Inpatient Hospital Stay (HOSPITAL_COMMUNITY)
Admission: EM | Admit: 2011-12-22 | Discharge: 2011-12-24 | DRG: 547 | Disposition: A | Discharge status: Home / Self Care | Source: Ambulatory Visit | Attending: Family Medicine | Admitting: Family Medicine

## 2011-12-22 ENCOUNTER — Encounter (HOSPITAL_COMMUNITY): Payer: Self-pay | Admitting: Emergency Medicine

## 2011-12-22 ENCOUNTER — Emergency Department (HOSPITAL_COMMUNITY)

## 2011-12-22 DIAGNOSIS — K219 Gastro-esophageal reflux disease without esophagitis: Secondary | ICD-10-CM

## 2011-12-22 DIAGNOSIS — E876 Hypokalemia: Secondary | ICD-10-CM | POA: Diagnosis present

## 2011-12-22 DIAGNOSIS — M329 Systemic lupus erythematosus, unspecified: Secondary | ICD-10-CM

## 2011-12-22 DIAGNOSIS — R071 Chest pain on breathing: Secondary | ICD-10-CM | POA: Diagnosis present

## 2011-12-22 DIAGNOSIS — I2699 Other pulmonary embolism without acute cor pulmonale: Secondary | ICD-10-CM

## 2011-12-22 DIAGNOSIS — M79609 Pain in unspecified limb: Secondary | ICD-10-CM

## 2011-12-22 DIAGNOSIS — D72829 Elevated white blood cell count, unspecified: Secondary | ICD-10-CM | POA: Diagnosis present

## 2011-12-22 DIAGNOSIS — M76899 Other specified enthesopathies of unspecified lower limb, excluding foot: Secondary | ICD-10-CM | POA: Diagnosis present

## 2011-12-22 DIAGNOSIS — D638 Anemia in other chronic diseases classified elsewhere: Secondary | ICD-10-CM | POA: Diagnosis present

## 2011-12-22 LAB — COMPREHENSIVE METABOLIC PANEL
ALT: 14 U/L (ref 0–35)
AST: 31 U/L (ref 0–37)
Albumin: 2.8 g/dL — ABNORMAL LOW (ref 3.5–5.2)
CO2: 27 mEq/L (ref 19–32)
Chloride: 100 mEq/L (ref 96–112)
Creatinine, Ser: 0.74 mg/dL (ref 0.50–1.10)
GFR calc non Af Amer: >90 mL/min (ref >90)
Sodium: 135 mEq/L (ref 135–145)
Total Bilirubin: 0.3 mg/dL (ref 0.3–1.2)

## 2011-12-22 LAB — URINALYSIS, ROUTINE W REFLEX MICROSCOPIC
Bilirubin Urine: NEGATIVE
Glucose, UA: NEGATIVE mg/dL
Hgb urine dipstick: NEGATIVE
Specific Gravity, Urine: 1.012 (ref 1.005–1.030)
Urobilinogen, UA: 1 mg/dL (ref 0.0–1.0)

## 2011-12-22 LAB — CBC
HCT: 31.4 % — ABNORMAL LOW (ref 36.0–46.0)
MCH: 26.1 pg (ref 26.0–34.0)
MCV: 83.7 fL (ref 78.0–100.0)
Platelets: 304 10*3/uL (ref 150–400)
RBC: 4.04 MIL/uL (ref 3.87–5.11)
RBC: 3.75 MIL/uL — ABNORMAL LOW (ref 3.87–5.11)
RDW: 13.8 % (ref 11.5–15.5)
WBC: 14.6 10*3/uL — ABNORMAL HIGH (ref 4.0–10.5)
WBC: 15.9 10*3/uL — ABNORMAL HIGH (ref 4.0–10.5)

## 2011-12-22 LAB — DIFFERENTIAL
Basophils Absolute: 0 10*3/uL (ref 0.0–0.1)
Lymphocytes Relative: 17 % (ref 12–46)
Lymphs Abs: 2.5 10*3/uL (ref 0.7–4.0)
Neutro Abs: 11.2 10*3/uL — ABNORMAL HIGH (ref 1.7–7.7)
Neutrophils Relative %: 77 % (ref 43–77)

## 2011-12-22 LAB — TROPONIN I: Troponin I: <0.3 ng/mL (ref <0.30)

## 2011-12-22 LAB — URINE MICROSCOPIC-ADD ON

## 2011-12-22 LAB — CREATININE, SERUM: GFR calc Af Amer: >90 mL/min (ref >90)

## 2011-12-22 LAB — RETICULOCYTES
RBC.: 3.95 MIL/uL (ref 3.87–5.11)
Retic Count, Absolute: 59.3 10*3/uL (ref 19.0–186.0)
Retic Ct Pct: 1.5 % (ref 0.4–3.1)

## 2011-12-22 MED ORDER — POTASSIUM CHLORIDE 20 MEQ/15ML (10%) PO LIQD
40.0000 meq | Freq: Once | ORAL | Status: AC
  Administered 2011-12-22: 40 meq via ORAL
  Filled 2011-12-22: qty 30

## 2011-12-22 MED ORDER — SODIUM CHLORIDE 0.9 % IV SOLN
INTRAVENOUS | Status: AC
  Administered 2011-12-22: 19:00:00 via INTRAVENOUS

## 2011-12-22 MED ORDER — SODIUM CHLORIDE 0.9 % IJ SOLN
3.0000 mL | Freq: Two times a day (BID) | INTRAMUSCULAR | Status: DC
  Administered 2011-12-22 – 2011-12-24 (×3): 3 mL via INTRAVENOUS

## 2011-12-22 MED ORDER — GI COCKTAIL ~~LOC~~
30.0000 mL | Freq: Once | ORAL | Status: AC
  Administered 2011-12-22: 30 mL via ORAL
  Filled 2011-12-22: qty 30

## 2011-12-22 MED ORDER — POTASSIUM CHLORIDE IN NACL 20-0.9 MEQ/L-% IV SOLN
INTRAVENOUS | Status: DC
  Filled 2011-12-22 (×4): qty 1000

## 2011-12-22 MED ORDER — FENTANYL 75 MCG/HR TD PT72
75.0000 ug | MEDICATED_PATCH | TRANSDERMAL | Status: DC
  Administered 2011-12-22: 75 ug via TRANSDERMAL
  Filled 2011-12-22 (×2): qty 1

## 2011-12-22 MED ORDER — KETOROLAC TROMETHAMINE 30 MG/ML IJ SOLN
30.0000 mg | Freq: Once | INTRAMUSCULAR | Status: AC
  Administered 2011-12-22: 30 mg via INTRAVENOUS
  Filled 2011-12-22: qty 1

## 2011-12-22 MED ORDER — KETOROLAC TROMETHAMINE 60 MG/2ML IM SOLN
60.0000 mg | Freq: Once | INTRAMUSCULAR | Status: AC
  Administered 2011-12-22: 60 mg via INTRAMUSCULAR
  Filled 2011-12-22: qty 2

## 2011-12-22 MED ORDER — HEPARIN SODIUM (PORCINE) 5000 UNIT/ML IJ SOLN
5000.0000 [IU] | Freq: Three times a day (TID) | INTRAMUSCULAR | Status: DC
  Administered 2011-12-22 – 2011-12-24 (×6): 5000 [IU] via SUBCUTANEOUS
  Filled 2011-12-22 (×9): qty 1

## 2011-12-22 MED ORDER — HYDROMORPHONE HCL PF 1 MG/ML IJ SOLN: 1.0000 mg | INTRAMUSCULAR | Status: DC | PRN

## 2011-12-22 MED ORDER — DOCUSATE SODIUM 100 MG PO CAPS
100.0000 mg | ORAL_CAPSULE | Freq: Two times a day (BID) | ORAL | Status: DC
  Administered 2011-12-22 – 2011-12-24 (×4): 100 mg via ORAL
  Filled 2011-12-22 (×6): qty 1

## 2011-12-22 MED ORDER — SODIUM CHLORIDE 0.9 % IV BOLUS (SEPSIS)
1000.0000 mL | Freq: Once | INTRAVENOUS | Status: AC
  Administered 2011-12-22: 1000 mL via INTRAVENOUS

## 2011-12-22 MED ORDER — HYDROMORPHONE HCL PF 1 MG/ML IJ SOLN
1.0000 mg | Freq: Once | INTRAMUSCULAR | Status: AC
  Administered 2011-12-22: 1 mg via INTRAVENOUS
  Filled 2011-12-22: qty 1

## 2011-12-22 MED ORDER — PNEUMOCOCCAL VAC POLYVALENT 25 MCG/0.5ML IJ INJ
0.5000 mL | INJECTION | INTRAMUSCULAR | Status: AC
  Administered 2011-12-23: 0.5 mL via INTRAMUSCULAR
  Filled 2011-12-22: qty 0.5

## 2011-12-22 MED ORDER — PANTOPRAZOLE SODIUM 40 MG IV SOLR
40.0000 mg | Freq: Two times a day (BID) | INTRAVENOUS | Status: DC
  Administered 2011-12-22: 40 mg via INTRAVENOUS
  Filled 2011-12-22 (×3): qty 40

## 2011-12-22 MED ORDER — METHYLPREDNISOLONE SODIUM SUCC 125 MG IJ SOLR
125.0000 mg | Freq: Once | INTRAMUSCULAR | Status: AC
  Administered 2011-12-22: 125 mg via INTRAVENOUS
  Filled 2011-12-22: qty 2

## 2011-12-22 MED ORDER — HYDROMORPHONE HCL PF 1 MG/ML IJ SOLN: 0.5000 mg | INTRAMUSCULAR | Status: DC | PRN

## 2011-12-22 MED ORDER — METHYLPREDNISOLONE SODIUM SUCC 125 MG IJ SOLR
125.0000 mg | Freq: Three times a day (TID) | INTRAMUSCULAR | Status: DC
  Administered 2011-12-22 – 2011-12-23 (×3): 125 mg via INTRAVENOUS
  Filled 2011-12-22 (×5): qty 2

## 2011-12-22 NOTE — ED Notes (Signed)
Pt reports having a steroid shot last Monday for lupus.  denies having a flare up of lupus-pt on daily prednisone.   States that they gave her the shot in her (L) butt.  Reports that all week she had pain at the injection shot but then began to have bilateral wrist swelling, pain and tenderness.  Also reports that (R) hip began to hurt.  pts wrists extremely tender on palpation and with movement.  pts (R) hip also tender on palpation and with movement.  Muscular knot noted in (L) butt where injection was given.    Pt reports having substernal CP that is nonradiating, nontender on palpation, denies SOB, N/V/D.  deneis fever.

## 2011-12-22 NOTE — ED Notes (Signed)
Has lupus had a steriod shot last week  Started to have cp a couple of days ago  Since f Friday and her hips hurt  Hands are swollen wrist hurt

## 2011-12-22 NOTE — Progress Notes (Signed)
[facility] LAI [id number] Admission Data: 12/22/2011 5:43 PM Attending Provider: Carney Living, MD  WUJ:[email or web address],FAOZHY, MD, MD Consults/ Treatment Team:    MINERVA BLUETT is a 36 y.o. female patient admitted from ED awake, alert  & orientated  X 3,  No Order, VSS - Blood pressure 102/71, pulse 87, temperature 99.2 F (37.3 C), resp. rate 18, SpO2 99.00%., no c/o shortness of breath, no c/o chest pain, no distress noted. Tele # [id number] placed and pt is currently running:normal sinus rhythm.   IV site WDL:  antecubital left, condition patent and no redness with a transparent dsg that's clean dry and intact.  Allergies:   Allergies  Allergen Reactions  . Imuran (Azathioprine) Other (See Comments)    Ears burned  . Tramadol Other (See Comments)    Burned ears     Past Medical History  Diagnosis Date  . Lupus   . Lupus     History:  obtained from the patient. Tobacco/alcohol: denied none  Pt orientation to unit, room and routine. Information packet given to patient/family and safety video watched.  Admission INP armband ID verified with patient/family, and in place. SR up x 2, fall risk assessment complete with Patient and family verbalizing understanding of risks associated with falls. Pt verbalizes an understanding of how to use the call bell and to call for help before getting out of bed.  Skin, clean-dry- intact without evidence of bruising, or skin tears.   No evidence of skin break down noted on exam. no rashes, no ecchymoses    Will cont to monitor and assist as needed.  Merdith Adan Consuella Lose, RN 12/22/2011 5:43 PM

## 2011-12-22 NOTE — ED Provider Notes (Signed)
History     CSN: [id number]  Arrival date & time 12/22/11  1405   First MD Initiated Contact with Patient 12/22/11 1421      Chief Complaint  Patient presents with  . Chest Pain    (Consider location/radiation/quality/duration/timing/severity/associated sxs/prior treatment) HPI Pt with history of lupus reports she was seen by her rheumatologist about a week ago and given an IM steroid injection although she is unsure the reason why. She states since then she has had progressively worsening pain at the site of the injection in her L buttock, today it is severe, aching and worse with movement. She has also had increasing pain in her R hip and bilateral wrists. She has had minimal relief with her baseline fentanyl patch and PO oxycodone. For the last three days she has also begun to have sharp, intermittent midsternal chest pains. Worse with deep breaths and swallowing. She denies any cough or fever. Some SOB previously, but none right now. She has had prior pericardial effusion and is 6 years s/p pericardial window for same. She has been taking her baseline dose of prednisone.   Past Medical History  Diagnosis Date  . Lupus   . Lupus     Past Surgical History  Procedure Date  . Pericardial window     in 2007    No family history on file.  History  Substance Use Topics  . Smoking status: Never Smoker   . Smokeless tobacco: Not on file  . Alcohol Use: No    OB History    Grav Para Term Preterm Abortions TAB SAB Ect Mult Living                  Review of Systems All other systems reviewed and are negative except as noted in HPI.   Allergies  Imuran and Tramadol  Home Medications   Current Outpatient Rx  Name Route Sig Dispense Refill  . ALBUTEROL SULFATE HFA 108 (90 BASE) MCG/ACT IN AERS Inhalation Inhale 2 puffs into the lungs every 4 (four) hours as needed. For shortness of breath    . ESOMEPRAZOLE MAGNESIUM 20 MG PO CPDR Oral Take 20 mg by mouth daily before  breakfast.     . FENTANYL 75 MCG/HR TD PT72 Transdermal Place 1 patch onto the skin every 3 (three) days.     Marland Kitchen HYDROXYCHLOROQUINE SULFATE 200 MG PO TABS Oral Take 200 mg by mouth 2 (two) times daily.     Marland Kitchen MYCOPHENOLATE MOFETIL 500 MG PO TABS Oral Take 1,500 mg by mouth 2 (two) times daily.     . OXYCODONE HCL PO Oral Take 30 mg by mouth 3 (three) times daily.     Marland Kitchen PREDNISONE 5 MG PO TABS Oral Take 10 mg by mouth daily.       BP 113/66  Pulse 105  Temp 99.2 F (37.3 C)  SpO2 96%  Physical Exam  Nursing note and vitals reviewed. Constitutional: She is oriented to person, place, and time. She appears well-developed and well-nourished.  HENT:  Head: Normocephalic and atraumatic.  Eyes: EOM are normal. Pupils are equal, round, and reactive to light.  Neck: Normal range of motion. Neck supple.  Cardiovascular: Normal rate, normal heart sounds and intact distal pulses.   Pulmonary/Chest: Effort normal and breath sounds normal. No respiratory distress. She has no wheezes. She has no rales. She exhibits no tenderness.  Abdominal: Bowel sounds are normal. She exhibits no distension. There is no tenderness.  Musculoskeletal: Normal range  of motion. She exhibits edema and tenderness.       Pt has soreness over the site of her IM injection, but signs of infection. She has multiple areas of joint pain and swelling, most notably R hip and bilateral wrists/hands.   Neurological: She is alert and oriented to person, place, and time. She has normal strength. No cranial nerve deficit or sensory deficit.  Skin: Skin is warm and dry. No rash noted.  Psychiatric: She has a normal mood and affect.     ED Course  Procedures (including critical care time)  Labs Reviewed  CBC - Abnormal; Notable for the following:    WBC 14.6 (*)    Hemoglobin 10.6 (*)    HCT 33.6 (*)    All other components within normal limits  DIFFERENTIAL - Abnormal; Notable for the following:    Neutro Abs 11.2 (*)    All  other components within normal limits  COMPREHENSIVE METABOLIC PANEL - Abnormal; Notable for the following:    Potassium 3.1 (*)    BUN 5 (*)    Calcium 8.1 (*)    Albumin 2.8 (*)    All other components within normal limits  TROPONIN I  URINALYSIS, ROUTINE W REFLEX MICROSCOPIC   Dg Chest 2 View  12/22/2011  *RADIOLOGY REPORT*  Clinical Data: Chest pain  CHEST - 2 VIEW  Comparison: 06/21/2011; 06/14/2011; 02/12/2006; 02/02/2006  Findings: Grossly unchanged enlarged cardiac silhouette and mediastinal contours.  The lungs remain persistently reduced. There is mild cephalization of flow with indistinct pulmonary vasculature.  Bibasilar opacities favored to represent atelectasis. No definite pleural effusion or pneumothorax.  Grossly unchanged bones.  IMPRESSION: 1.  Mild pulmonary edema. No definite pleural effusions.  2.  Chronic bibasilar opacities favored to represent atelectasis.  Original Report Authenticated By: Waynard Reeds, M.D.     1. Lupus       MDM   Date: 12/22/2011   Rate: 102  Rhythm: sinus tachycardia  QRS Axis: normal  Intervals: normal  ST/T Wave abnormalities: nonspecific T wave changes  Conduction Disutrbances:none  Narrative Interpretation:  No EKG evidence of pericarditis  Old EKG Reviewed: unchanged   5:05 PM PT's pain minimally improved and then returned. Labs and imaging unremarkable, limited bedside US does not reveal a large pericardial effusion. Doubt tamponade, but may have a component of pericarditis as well as joint involvement from a lupus flare. Spoke with her rheumatologist in [facility], Dr. Sharmon Revere, who agrees with admission for pain control. Given IV solumedrol as well. Spoke with [facility] resident who will see the patient in the ED for admission.        Evangelina Delancey B. Bernette Mayers, MD 12/22/11 [phone]

## 2011-12-22 NOTE — ED Notes (Signed)
Pt to radiology.

## 2011-12-22 NOTE — H&P (Signed)
Elizabeth Baxter is an 36 y.o. female.   Chief Complaint: Chest Pain HPI: Pt is a 36 yo female presenting with 5 day worsening of her chronic lupus pain.  She reports her pain began 2 days ago and has progressively worsening prompting evaluation in the ED.    CHEST PAIN  Location: Mid sternal Quality: Sharp  Onset: @ rest Radiation: none Better with: nothing Worse with: movement  Symptoms History of Trauma/lifting: no  Nausea/vomiting: no  Diaphoresis: yes  Shortness of breath: yes  Pleuritic: yes  Cough: no  Edema: yes - associated with LUPUS FLARE Orthopnea: no PND: no  Dizziness: no  Palpitations: no  Syncope: no  Indigestion: yes   Red Flags Worse with exertion: no  Recent Immobility: no  Cancer history: no  Tearing/radiation to back: no   Pt has a long standing history of LUPUS and is followed by rheumatology at [facility].  She has been weaning her prednisone but has recently required an increase as well as a systemic steroid injection 4 days prior to admission.  She states this pain is all very similar to her prior LUPUS flares with exception of R hip pain that is new.  She has 2-3 LUPUS flares per year requiring increasingly higher doses of prednisone.    Past Medical History  Diagnosis Date  . Lupus     Past Surgical History  Procedure Date  . Pericardial window     in 2007    History reviewed. No pertinent family history. Social History:  reports that she has never smoked. She does not have any smokeless tobacco history on file. She reports that she does not drink alcohol or use illicit drugs.  Allergies:  Allergies  Allergen Reactions  . Imuran (Azathioprine) Other (See Comments)    Ears burned  . Tramadol Other (See Comments)    Burned ears    Medications Prior to Admission  Medication Sig Dispense Refill  . albuterol (PROVENTIL HFA;VENTOLIN HFA) 108 (90 BASE) MCG/ACT inhaler Inhale 2 puffs into the lungs every 4 (four) hours as needed. For  shortness of breath      . esomeprazole (NEXIUM) 20 MG capsule Take 20 mg by mouth daily before breakfast.       . fentaNYL (DURAGESIC - DOSED MCG/HR) 75 MCG/HR Place 1 patch onto the skin every 3 (three) days.       . hydroxychloroquine (PLAQUENIL) 200 MG tablet Take 200 mg by mouth 2 (two) times daily.       . mycophenolate (CELLCEPT) 500 MG tablet Take 1,500 mg by mouth 2 (two) times daily.       . OXYCODONE HCL PO Take 30 mg by mouth 3 (three) times daily.       . predniSONE (DELTASONE) 5 MG tablet Take 10 mg by mouth daily.         Results for orders placed during the hospital encounter of 12/22/11 (from the past 48 hour(s))  CBC     Status: Abnormal   Collection Time   12/22/11  2:20 PM      Component Value Range Comment   WBC 14.6 (*) 4.0 - 10.5 (K/uL)    RBC 4.04  3.87 - 5.11 (MIL/uL)    Hemoglobin 10.6 (*) 12.0 - 15.0 (g/dL)    HCT 47.8 (*) 29.5 - 46.0 (%)    MCV 83.2  78.0 - 100.0 (fL)    MCH 26.2  26.0 - 34.0 (pg)    MCHC 31.5  30.0 -  36.0 (g/dL)    RDW 30.8  65.7 - 84.6 (%)    Platelets 304  150 - 400 (K/uL)   DIFFERENTIAL     Status: Abnormal   Collection Time   12/22/11  2:20 PM      Component Value Range Comment   Neutrophils Relative 77  43 - 77 (%)    Neutro Abs 11.2 (*) 1.7 - 7.7 (K/uL)    Lymphocytes Relative 17  12 - 46 (%)    Lymphs Abs 2.5  0.7 - 4.0 (K/uL)    Monocytes Relative 3  3 - 12 (%)    Monocytes Absolute 0.4  0.1 - 1.0 (K/uL)    Eosinophils Relative 3  0 - 5 (%)    Eosinophils Absolute 0.4  0.0 - 0.7 (K/uL)    Basophils Relative 0  0 - 1 (%)    Basophils Absolute 0.0  0.0 - 0.1 (K/uL)   COMPREHENSIVE METABOLIC PANEL     Status: Abnormal   Collection Time   12/22/11  2:20 PM      Component Value Range Comment   Sodium 135  135 - 145 (mEq/L)    Potassium 3.1 (*) 3.5 - 5.1 (mEq/L)    Chloride 100  96 - 112 (mEq/L)    CO2 27  19 - 32 (mEq/L)    Glucose, Bld 93  70 - 99 (mg/dL)    BUN 5 (*) 6 - 23 (mg/dL)    Creatinine, Ser 9.62  0.50 - 1.10  (mg/dL)    Calcium 8.1 (*) 8.4 - 10.5 (mg/dL)    Total Protein 7.5  6.0 - 8.3 (g/dL)    Albumin 2.8 (*) 3.5 - 5.2 (g/dL)    AST 31  0 - 37 (U/L) HEMOLYSIS AT THIS LEVEL MAY AFFECT RESULT   ALT 14  0 - 35 (U/L)    Alkaline Phosphatase 67  39 - 117 (U/L)    Total Bilirubin 0.3  0.3 - 1.2 (mg/dL)    GFR calc non Af Amer >90  >90 (mL/min)    GFR calc Af Amer >90  >90 (mL/min)   TROPONIN I     Status: Normal   Collection Time   12/22/11  2:24 PM      Component Value Range Comment   Troponin I <0.30  <0.30 (ng/mL)   URINALYSIS, ROUTINE W REFLEX MICROSCOPIC     Status: Abnormal   Collection Time   12/22/11  6:35 PM      Component Value Range Comment   Color, Urine YELLOW  YELLOW     APPearance CLOUDY (*) CLEAR     Specific Gravity, Urine 1.012  1.005 - 1.030     pH 6.0  5.0 - 8.0     Glucose, UA NEGATIVE  NEGATIVE (mg/dL)    Hgb urine dipstick NEGATIVE  NEGATIVE     Bilirubin Urine NEGATIVE  NEGATIVE     Ketones, ur NEGATIVE  NEGATIVE (mg/dL)    Protein, ur NEGATIVE  NEGATIVE (mg/dL)    Urobilinogen, UA 1.0  0.0 - 1.0 (mg/dL)    Nitrite NEGATIVE  NEGATIVE     Leukocytes, UA SMALL (*) NEGATIVE    URINE MICROSCOPIC-ADD ON     Status: Abnormal   Collection Time   12/22/11  6:35 PM      Component Value Range Comment   Squamous Epithelial / LPF MANY (*) RARE     WBC, UA 3-6  <3 (WBC/hpf)    RBC / HPF 0-2  <  3 (RBC/hpf)    Bacteria, UA FEW (*) RARE     Urine-Other TRICHOMONAS PRESENT     CBC     Status: Abnormal   Collection Time   12/22/11  7:30 PM      Component Value Range Comment   WBC 15.9 (*) 4.0 - 10.5 (K/uL)    RBC 3.75 (*) 3.87 - 5.11 (MIL/uL)    Hemoglobin 9.8 (*) 12.0 - 15.0 (g/dL)    HCT 47.8 (*) 29.5 - 46.0 (%)    MCV 83.7  78.0 - 100.0 (fL)    MCH 26.1  26.0 - 34.0 (pg)    MCHC 31.2  30.0 - 36.0 (g/dL)    RDW 62.1  30.8 - 65.7 (%)    Platelets 261  150 - 400 (K/uL)   CREATININE, SERUM     Status: Normal   Collection Time   12/22/11  7:30 PM      Component Value  Range Comment   Creatinine, Ser 0.71  0.50 - 1.10 (mg/dL)    GFR calc non Af Amer >90  >90 (mL/min)    GFR calc Af Amer >90  >90 (mL/min)    Dg Chest 2 View  12/22/2011  *RADIOLOGY REPORT*  Clinical Data: Chest pain  CHEST - 2 VIEW  Comparison: 06/21/2011; 06/14/2011; 02/12/2006; 02/02/2006  Findings: Grossly unchanged enlarged cardiac silhouette and mediastinal contours.  The lungs remain persistently reduced. There is mild cephalization of flow with indistinct pulmonary vasculature.  Bibasilar opacities favored to represent atelectasis. No definite pleural effusion or pneumothorax.  Grossly unchanged bones.  IMPRESSION: 1.  Mild pulmonary edema. No definite pleural effusions.  2.  Chronic bibasilar opacities favored to represent atelectasis.  Original Report Authenticated By: Waynard Reeds, M.D.    Review of Systems  Constitutional: Positive for malaise/fatigue and diaphoresis. Negative for fever, chills and weight loss.  HENT: Positive for neck pain. Negative for hearing loss, congestion and sore throat.   Eyes: Negative for blurred vision and double vision.  Respiratory: Positive for cough and shortness of breath. Negative for sputum production and wheezing.   Cardiovascular: Positive for chest pain. Negative for palpitations and orthopnea.  Gastrointestinal: Positive for heartburn and nausea. Negative for vomiting, abdominal pain, constipation and blood in stool.  Genitourinary: Negative for dysuria and urgency.  Musculoskeletal: Positive for myalgias, back pain and joint pain. Negative for falls.  Skin: Positive for rash. Negative for itching.       Chronic Lupus associated on legs and torso   Neurological: Positive for weakness and headaches. Negative for dizziness and focal weakness.  Endo/Heme/Allergies: Negative for environmental allergies and polydipsia. Does not bruise/bleed easily.  Psychiatric/Behavioral: Negative.     Blood pressure 95/60, pulse 73, temperature 98.2 F  (36.8 C), temperature source Oral, resp. rate 16, height 5\' 7"  (1.702 m), weight 197 lb (89.359 kg), SpO2 99.00%. Physical Exam  Constitutional: She is oriented to person, place, and time. She appears well-developed and well-nourished. She appears distressed.  HENT:  Head: Normocephalic and atraumatic.  Neck: No JVD present. No tracheal deviation present.  Cardiovascular: Normal rate, regular rhythm and normal heart sounds.  Exam reveals no friction rub.   No murmur heard. Respiratory: Effort normal. No respiratory distress. She has no wheezes. She has rales. She exhibits tenderness.  GI: Soft. Bowel sounds are normal. She exhibits no distension and no mass. There is no tenderness. There is no rebound and no guarding.  Musculoskeletal: She exhibits edema and tenderness.  Right wrist: She exhibits tenderness and swelling.       Left wrist: She exhibits tenderness and swelling.       Right hip: She exhibits tenderness.       Diffusely in UE and LE.   TTP over R greater trochanter  Neurological: She is alert and oriented to person, place, and time.  Skin: Skin is warm and dry. Rash noted. She is not diaphoretic.       Hypopigmentation over L gluteal; pt indicates this is the site of steroid injection  Psychiatric: She has a normal mood and affect. Her behavior is normal. Judgment and thought content normal.     Assessment/Plan Pt is a 36 yo AA female presenting with diffuse skeletal pain and chest pain that is overall consistent with her prior SLE flares.  She is followed at [facility] and has recently been trying to wean her predisone but required a steroid injection as an outpatient 4. days prior to admission  # CHEST PAIN - although typical for her SLE flare potentially concerning for pericardial effusion/pericarditis or pleuritis.  Also consider intrinsic cardiac source.   - No acute EKG changes - Per EDP no effusion on bed side ECHO - consider atypical GI  presentation given history of chronic steroid use  [facility] Course     Troponin Negative X 1  Chest X-Ray - Pulmonary edema with B atelectasis; no pna or cardiomegaly                                      Plan / Followup  [ ]  CYCLE CEs [ ]  REPEAT EKG IN AM [ ]  Formal 2D ECHO in AM   # LUPUS - Pt with acute flare  - concern for complications including cardiac and pleural effusions (no evidence for at this time)  [facility] Course   *PREDNISONE *TORADOL *DILAUDID *PLAQUENEL  Treat with Steroids (NSAIDs for acute relief)                                      Plan / Followup  Anticipate improvement in symptoms with steroids and NSAIDs Continue to monitor for pulmonary or cardiac involvement    #GI (GERD) - pt reports long standing issues with reflux like symptoms; mildly worsened at this time  [facility] Course   *PPI IV  Symptoms improved mildly following GI cocktail                                      Plan / Followup  Continue PPI long term for long term steroid use If worsening or     # LEG PAIN (R greater trochanteric bursitis) - pt tender over R greater trochanter with pain reproduced with palpation over bursa and tensor fascia lata  [facility]   *ice to hip 15 mins tid                                       Plan / Followup  ICE to hip - anticipate improvement with improvement of her her underlying LUPUS   --- FEN  *NS + 20KCl @  148ml/hr -Regular Diet - advance at tolerate --- PPx: Heparin + PPI            DISPOSITION  Will place on telemetry and observe for any worsening cardiac or pulmonary involvement.  Further disposition pending improvement in pain  Andrena Mews, DO [facility] Resident - PGY-1 12/22/2011 8:55 PM   ----------------------------------------------------------------------------------------------------------------------------------  PGY-3 Addendum: Pt seen and examined  and agree with above. Please see excellent R1 note for full details.  Briefly, this is a 36 year old female with past medical history of lupus diagnosed approximately 10 years ago who has a well established community rheumatologist presenting with chest pain x 4 days. Patient states that she was seen by a rheumatologist approximately one week ago and was given an IM injection of steroid because rheumatologist felt that her lupus was flaring. Patient states that 3-4 days later she began to notice persistent substernal chest pain. Patient describes chest pain as being substernal in nature without radiation to the neck to the jaw. No associated diaphoresis or nausea. Patient states the chest pain is worse with swallowing as well as deep breathing as well as movement. Patient reports a history of pericardial effusion status post pericardial window approximately in 2007. Patient states she had chest pain at that time however this is not as severe. Patient denies any fevers or chills. Chest has been relatively persistent. Patient is currently on fentanyl as well as oxycodone for chronic pain. Pain has been mildly relieved with oxycodone. Prior to the steroid shot, patient states that her rheumatologist has been trying to cut back on her daily prednisone use however has not been able to because of recurrent lupus flares. Patient states her last significant lupus flare was in the winter of 2012. Pt is currently taking nexium 1 tab daily. Pt denies any reflux sxs, though pt states that she received GI cocktail in ED which made her CP feel somewhat better. Patient is also noticed some symmetric joint pain and swelling predominantly in the wrists and hands as well as a right hip. These joint pains are consistent with previous lupus flares of the past In the ED, pt received bedside ECHO which was negative for pericardial effusion per report. No diffuse ST segment elevations, or low voltage QRS. CXR with pulmonary edema and  chronic bibasilar atelectasis.   Physical Exam:  As above.   Assessment and Plan: 36 year old female with baseline history of lupus presenting with chest pain, joint pain joint swelling.  Chest Pain: Relatively broad differential for this with lupus pleuritis versus costochondritis versus gastritis being higher up on the differential. Pergolide is seen and pericardial effusion had been somewhat ruled out tentatively given a normal bedside echo per report as well as a normal EKG and 9 dictated chest x-ray apart from pulmonary edema. Will place on IV Solu-Medrol to treat lupus as well as Toradol x1 to help with costochondritic symptoms. High dose PPI for GI prophylaxis. Given that there is no confirmed normal pericardial fluid noted to in patient's chart, we'll order a formal 2-D echocardiogram to formally assess. Patient is able to speak in full sentences as well as is no friction rub on clinical exam. Will formally cycle cardiac enzymes. From a chronic pain standpoint we'll continue patient on home dose fentanyl as well as Dilaudid 0.5 mg every 6 hours for pain control. Will hold home oxycodone given IV Dilaudid use.  Leg Pain: trochanteric bursitis. On IV solumedrol. ICE to area.  If persists, will consider inter-trochanteric  injection.   Leukocytosis: Likely secondary to chronic steroid use. Afebrile on presentation. Will continue clinically follow  Hypokalemia: Likely secondary dehydration. Patient is clinically dry on exam. Patient is status post K. Dur in ED. I will add potassium to IV fluids.  Anemia: Noted hemoglobin 10.6 on admission. This likely chronic issue and likely anemia of chronic disease given underlying lupus. Will check iron panel to confirm.  FEN/GI: Normal saline plus potassium at maintenance rate. High dose PPI. Regular diet.  Prophylaxis: Subcutaneous heparin  Dispo: Pending further evaluation.

## 2011-12-22 NOTE — ED Notes (Signed)
Pt reports a decrease in her pain with the GI cocktail.

## 2011-12-23 ENCOUNTER — Inpatient Hospital Stay (HOSPITAL_COMMUNITY)

## 2011-12-23 DIAGNOSIS — R072 Precordial pain: Secondary | ICD-10-CM

## 2011-12-23 LAB — BASIC METABOLIC PANEL
CO2: 26 mEq/L (ref 19–32)
Calcium: 8.2 mg/dL — ABNORMAL LOW (ref 8.4–10.5)
Chloride: 107 mEq/L (ref 96–112)
Glucose, Bld: 117 mg/dL — ABNORMAL HIGH (ref 70–99)
Potassium: 4.2 mEq/L (ref 3.5–5.1)
Sodium: 138 mEq/L (ref 135–145)

## 2011-12-23 LAB — IRON AND TIBC: UIBC: 179 ug/dL (ref 125–400)

## 2011-12-23 LAB — CBC
Hemoglobin: 11.1 g/dL — ABNORMAL LOW (ref 12.0–15.0)
MCH: 26.3 pg (ref 26.0–34.0)
RBC: 4.22 MIL/uL (ref 3.87–5.11)
WBC: 16.2 10*3/uL — ABNORMAL HIGH (ref 4.0–10.5)

## 2011-12-23 LAB — TROPONIN I: Troponin I: <0.3 ng/mL (ref <0.30)

## 2011-12-23 LAB — FOLATE: Folate: 5.1 ng/mL

## 2011-12-23 LAB — FERRITIN: Ferritin: 73 ng/mL (ref 10–291)

## 2011-12-23 MED ORDER — OXYCODONE HCL 10 MG PO TB12: 30.0000 mg | ORAL_TABLET | Freq: Four times a day (QID) | ORAL | Status: DC | PRN

## 2011-12-23 MED ORDER — LORAZEPAM 1 MG PO TABS
2.0000 mg | ORAL_TABLET | Freq: Once | ORAL | Status: AC
  Administered 2011-12-23: 2 mg via ORAL
  Filled 2011-12-23: qty 2

## 2011-12-23 MED ORDER — HYDROMORPHONE HCL PF 1 MG/ML IJ SOLN
0.5000 mg | INTRAMUSCULAR | Status: DC | PRN
  Administered 2011-12-23 (×2): 0.5 mg via INTRAVENOUS
  Filled 2011-12-23 (×2): qty 1

## 2011-12-23 MED ORDER — PANTOPRAZOLE SODIUM 40 MG PO TBEC
40.0000 mg | DELAYED_RELEASE_TABLET | Freq: Two times a day (BID) | ORAL | Status: DC
  Administered 2011-12-23 – 2011-12-24 (×3): 40 mg via ORAL
  Filled 2011-12-23 (×3): qty 1

## 2011-12-23 MED ORDER — GADOBENATE DIMEGLUMINE 529 MG/ML IV SOLN
19.0000 mL | Freq: Once | INTRAVENOUS | Status: AC
  Administered 2011-12-23: 19 mL via INTRAVENOUS

## 2011-12-23 MED ORDER — OXYCODONE HCL 10 MG PO TB12
30.0000 mg | ORAL_TABLET | Freq: Three times a day (TID) | ORAL | Status: DC
  Administered 2011-12-23 – 2011-12-24 (×4): 30 mg via ORAL
  Filled 2011-12-23 (×4): qty 3

## 2011-12-23 MED ORDER — HYDROMORPHONE HCL PF 1 MG/ML IJ SOLN
0.5000 mg | INTRAMUSCULAR | Status: AC | PRN
  Administered 2011-12-23: 0.5 mg via INTRAVENOUS
  Filled 2011-12-23: qty 1

## 2011-12-23 MED ORDER — OXYCODONE HCL 10 MG PO TB12: 30.0000 mg | ORAL_TABLET | Freq: Two times a day (BID) | ORAL | Status: DC

## 2011-12-23 MED ORDER — PREDNISONE 20 MG PO TABS
40.0000 mg | ORAL_TABLET | Freq: Every day | ORAL | Status: DC
  Administered 2011-12-24: 40 mg via ORAL
  Filled 2011-12-23 (×2): qty 2

## 2011-12-23 NOTE — Progress Notes (Signed)
Family Medicine Teaching Service Daily progress Note:  [id number] Subjective: Chest pain has improved to 3/10 from 10/10 yesterday. Pain medication helping with this. Pain is worst with breathing in and is also associated with movement. She denies any shortness of breath. No nausea, no vomiting. Pain is still present in right hip.  Tele monitor: sinus rhythm in the 60's with occasional PVC's.   Objective: Vital signs in last 24 hours: Temp:  [97.6 F (36.4 C)-99.2 F (37.3 C)] 97.6 F (36.4 C) (05/14 0526) Pulse Rate:  [64-105] 64  (05/14 0526) Resp:  [16-18] 18  (05/14 0526) BP: (95-120)/(60-71) 120/66 mmHg (05/14 0526) SpO2:  [96 %-99 %] 98 % (05/14 0526) Weight:  [197 lb (89.359 kg)] 197 lb (89.359 kg) (05/13 1848) Weight change:     Intake/Output from previous day: 05/13 0701 - 05/14 0700 In: 325.4 [P.O.:240; I.V.:85.4] Out: 750 [Urine:750] Intake/Output this shift:    General appearance: alert, cooperative and tired appearing  Resp: clear to auscultation bilaterally Cardio: regular rate and rhythm, S1, S2 normal, no murmur, click, rub or gallop GI: soft, non-tender; bowel sounds normal; no masses,  no organomegaly Extremities: extremities normal, atraumatic, no cyanosis or pitting edema. Negative Homans' sign Pulses: 2+ and symmetric Neurologic: Alert and oriented X 3, normal strength and tone.  MSK - tenderness to palpation along right trochanter. Pain with right hip flexion.   Lab Results:  Basename 12/23/11 0630 12/22/11 1930  WBC 16.2* 15.9*  HGB 11.1* 9.8*  HCT 35.3* 31.4*  PLT 305 261   BMET  Basename 12/23/11 0630 12/22/11 1930 12/22/11 1420  NA 138 -- 135  K 4.2 -- 3.1*  CL 107 -- 100  CO2 26 -- 27  GLUCOSE 117* -- 93  BUN 6 -- 5*  CREATININE 0.57 0.71 --  CALCIUM 8.2* -- 8.1*    Studies/Results: Dg Chest 2 View  12/22/2011  *RADIOLOGY REPORT*  Clinical Data: Chest pain  CHEST - 2 VIEW  Comparison: 06/21/2011; 06/14/2011; 02/12/2006; 02/02/2006   Findings: Grossly unchanged enlarged cardiac silhouette and mediastinal contours.  The lungs remain persistently reduced. There is mild cephalization of flow with indistinct pulmonary vasculature.  Bibasilar opacities favored to represent atelectasis. No definite pleural effusion or pneumothorax.  Grossly unchanged bones.  IMPRESSION: 1.  Mild pulmonary edema. No definite pleural effusions.  2.  Chronic bibasilar opacities favored to represent atelectasis.  Original Report Authenticated By: Waynard Reeds, M.D.    Medications:  Scheduled:   . sodium chloride   Intravenous STAT  . docusate sodium  100 mg Oral BID  . fentaNYL  75 mcg Transdermal Q72H  . gi cocktail  30 mL Oral Once  . heparin  5,000 Units Subcutaneous Q8H  .  HYDROmorphone (DILAUDID) injection  1 mg Intravenous Once  .  HYDROmorphone (DILAUDID) injection  1 mg Intravenous Once  . ketorolac  30 mg Intravenous Once  . ketorolac  60 mg Intramuscular Once  . methylPREDNISolone (SOLU-MEDROL) injection  125 mg Intravenous Once  . methylPREDNISolone (SOLU-MEDROL) injection  125 mg Intravenous Q8H  . pantoprazole  40 mg Oral BID AC  . pneumococcal 23 valent vaccine  0.5 mL Intramuscular Tomorrow-1000  . potassium chloride  40 mEq Oral Once  . sodium chloride  1,000 mL Intravenous Once  . sodium chloride  3 mL Intravenous Q12H  . DISCONTD: pantoprazole (PROTONIX) IV  40 mg Intravenous Q12H   Continuous:   . 0.9 % NaCl with KCl 20 mEq / L  Assessment/Plan: 36 yo female with h/o lupus since 2001 and previous pericardial window in 2007 who presented with chest pain and right sided hip pain.   # CHEST PAIN  - consider pericardial effusion/pericarditis or pleuritis, although this is typical from her lupus flare. Cardiac enzymes have been negative x3 and there were no EKG changes making acute MI unlikely. there could also be a component of GI involvement since she has a use of chronic steroids. Currently on IV protonix 40mg  bid  for steroid prophylaxis. - follow up echo for any evidence of pericarditis or effusion. Bedside US was normal, but will get echo to confirm.  - morning repeat EKG pending  # LUPUS  - Pt with acute flare  - concern for complications including cardiac and pleural effusions (no evidence for at this time)  - continue solumedrol q8 and taper to oral prednisone 60mg  - continue toradol, dilaudid, plaquenil and NSAID - will contact  Her rheumatologist at [facility] for guidance with prednisone taper  #GI (GERD)  - pt reports long standing issues with reflux like symptoms; stable today - contnue IV protonix for now while on solumedrol, will transition to po when on oral steroids   # LEG PAIN (R greater trochanteric bursitis)  - will monitor closely. Differential for hip pain in the setting of chronic prednisone use also includes avascular necrosis.  - continue icing hip  # Fen/Gi: regular diet  # PPx: heparin 5000u tid and IV protonix 40mg  bid    LOS: 1 day   Emonii Wienke 12/23/2011, 9:38 AM

## 2011-12-23 NOTE — Progress Notes (Signed)
  Echocardiogram 2D Echocardiogram has been performed.  Cathie Beams Deneen 12/23/2011, 12:17 PM

## 2011-12-23 NOTE — Plan of Care (Signed)
Problem: Phase I Progression Outcomes Goal: Pain controlled with appropriate interventions Outcome: Progressing Oxycontin ordered and Dilaudid D/C'd.

## 2011-12-23 NOTE — H&P (Signed)
Family Medicine Teaching Service Attending Note  I interviewed and examined patient Elizabeth Baxter and reviewed their tests and x-rays.  I discussed with Dr. Alvester Morin and reviewed their note for today.  I agree with their assessment and plan.     Additionally  Feeling less pain and no shortness of breath Awaiting echo  No signs of cardiac ischemia, pneumonia, pulmonary emboli or pneumothorax If echo is ok would discharge with analgesics and follow up with her rheumatologist Continue PPI as long as taking nsaids

## 2011-12-23 NOTE — Care Management Note (Signed)
    Page 1 of 1   12/25/2011     8:43:32 AM   CARE MANAGEMENT NOTE 12/25/2011  Patient:  Formica,Deja N   Account Number:  [id number]  Date Initiated:  12/23/2011  Documentation initiated by:  Letha Cape  Subjective/Objective Assessment:   dx lupus flare  admit- lives with spouse.     Action/Plan:   Anticipated DC Date:  12/25/2011   Anticipated DC Plan:  HOME/SELF CARE      DC Planning Services  CM consult      Choice offered to / List presented to:             Status of service:  Completed, signed off Medicare Important Message given?   (If response is "NO", the following Medicare IM given date fields will be blank) Date Medicare IM given:   Date Additional Medicare IM given:    Discharge Disposition:  HOME/SELF CARE  Per UR Regulation:  Reviewed for med. necessity/level of care/duration of stay  If discussed at Long Length of Stay Meetings, dates discussed:    Comments:  12/23/11 17:09 Letha Cape RN, BSN [phone] patient lives with spouse, NCM will continue to follow for dc needs.

## 2011-12-24 ENCOUNTER — Inpatient Hospital Stay (HOSPITAL_COMMUNITY)

## 2011-12-24 LAB — TROPONIN I: Troponin I: <0.3 ng/mL (ref <0.30)

## 2011-12-24 LAB — DIFFERENTIAL
Basophils Absolute: 0 10*3/uL (ref 0.0–0.1)
Basophils Relative: 0 % (ref 0–1)
Eosinophils Absolute: 0 10*3/uL (ref 0.0–0.7)
Monocytes Relative: 5 % (ref 3–12)
Neutro Abs: 15.2 10*3/uL — ABNORMAL HIGH (ref 1.7–7.7)
Neutrophils Relative %: 85 % — ABNORMAL HIGH (ref 43–77)

## 2011-12-24 LAB — CBC
MCH: 26 pg (ref 26.0–34.0)
MCHC: 31 g/dL (ref 30.0–36.0)
Platelets: 325 10*3/uL (ref 150–400)
RDW: 14.1 % (ref 11.5–15.5)

## 2011-12-24 LAB — BASIC METABOLIC PANEL
BUN: 10 mg/dL (ref 6–23)
Chloride: 108 mEq/L (ref 96–112)
GFR calc Af Amer: >90 mL/min (ref >90)
GFR calc non Af Amer: >90 mL/min (ref >90)
Potassium: 3.9 mEq/L (ref 3.5–5.1)
Sodium: 139 mEq/L (ref 135–145)

## 2011-12-24 MED ORDER — PREDNISONE 20 MG PO TABS: 40.0000 mg | ORAL_TABLET | Freq: Every day | ORAL | Status: AC

## 2011-12-24 MED ORDER — RIVAROXABAN 15 MG PO TABS
15.0000 mg | ORAL_TABLET | Freq: Two times a day (BID) | ORAL | Status: DC
  Administered 2011-12-24: 15 mg via ORAL
  Filled 2011-12-24 (×3): qty 1

## 2011-12-24 MED ORDER — IOHEXOL 350 MG/ML SOLN
100.0000 mL | Freq: Once | INTRAVENOUS | Status: AC | PRN
  Administered 2011-12-24: 100 mL via INTRAVENOUS

## 2011-12-24 MED ORDER — RIVAROXABAN 15 MG PO TABS: 15.0000 mg | ORAL_TABLET | Freq: Two times a day (BID) | ORAL | Status: DC

## 2011-12-24 MED ORDER — OXYCODONE HCL 5 MG PO TABS
15.0000 mg | ORAL_TABLET | ORAL | Status: AC
  Administered 2011-12-24: 15 mg via ORAL
  Filled 2011-12-24: qty 3

## 2011-12-24 MED ORDER — PREDNISONE 20 MG PO TABS: 40.0000 mg | ORAL_TABLET | Freq: Every day | ORAL | Status: DC

## 2011-12-24 MED ORDER — RIVAROXABAN 20 MG PO TABS: 20.0000 mg | ORAL_TABLET | Freq: Every day | ORAL | Status: DC

## 2011-12-24 NOTE — Progress Notes (Signed)
ANTICOAGULATION CONSULT NOTE - Initial Consult  Pharmacy Consult for Rivaroxaban Indication: pulmonary embolus - new small right lobe  Allergies  Allergen Reactions  . Imuran (Azathioprine) Other (See Comments)    Ears burned  . Tramadol Other (See Comments)    Burned ears    Patient Measurements: Height: 5\' 7"  (170.2 cm) Weight: 197 lb (89.359 kg) IBW/kg (Calculated) : 61.6    Vital Signs: Temp: 97.3 F (36.3 C) (05/15 1430) Temp src: Oral (05/15 1430) BP: 109/72 mmHg (05/15 1430) Pulse Rate: 51  (05/15 1430)  Labs:  Basename 12/24/11 1630 12/24/11 1031 12/24/11 0409 12/23/11 0630 12/22/11 1930  HGB -- -- 10.1* 11.1* --  HCT -- -- 32.6* 35.3* 31.4*  PLT -- -- 325 305 261  APTT -- -- -- -- --  LABPROT -- -- -- -- --  INR -- -- -- -- --  HEPARINUNFRC -- -- -- -- --  CREATININE -- -- 0.57 0.57 0.71  CKTOTAL -- -- -- -- --  CKMB -- -- -- -- --  TROPONINI <0.30 <0.30 <0.30 -- --    Estimated Creatinine Clearance: 113.7 ml/min (by C-G formula based on Cr of 0.57).   Medical History: Past Medical History  Diagnosis Date  . Lupus     Assessment: 36yof admitted with 5day history of Lupus pain flare including CP.  Chest CT + small right PE.  Increased WBC with steroids, afebrile, H/Hand PLTC ok.  No s/s bleeding.  Plan to initiate rivaroxaban 15mg  BID x21days then 20mg  daily.    Goal of Therapy:   Monitor platelets by anticoagulation protocol: Yes   Plan:  rivaroxaban 15mg  BID x21days then 20mg  daily. Monitor cbc and s/s bleeding  Marcelino Scot 12/24/2011,5:41 PM

## 2011-12-24 NOTE — Progress Notes (Signed)
FMTS Attending Note Patient seen and examined by me, discussed with resident team and I agree with Dr Whitney Muse note for today.  Patient continues to have pleuritic chest pain, which remains relatively unexplained.  She does not exhibit hypoxia or tachycardia, and her cardiac enzymes have remained unremarkable.  Given increased risk of PE in patient with SLE, will pursue CTA chest to rule this out.   Paula Compton, MD

## 2011-12-24 NOTE — Progress Notes (Signed)
Family Medicine Teaching Service Daily progress Note:  [id number] Subjective: Hip pain improved. Chest pain improved but still present, dull 3-4/10 with inspiration. Feels some shortness of breath when it occurs.  Hip pain improving.  Objective: Vital signs in last 24 hours: Temp:  [97.5 F (36.4 C)-97.7 F (36.5 C)] 97.7 F (36.5 C) (05/15 0549) Pulse Rate:  [52-67] 55  (05/15 0549) Resp:  [18-20] 18  (05/15 0549) BP: (97-107)/(60-70) 97/60 mmHg (05/15 0549) SpO2:  [98 %-99 %] 99 % (05/15 0549) Weight change:     Intake/Output from previous day: 05/14 0701 - 05/15 0700 In: 963 [P.O.:360; I.V.:3] Out: -  Intake/Output this shift:   General appearance: alert, cooperative and tired appearing  Resp: clear to auscultation bilaterally, pleuritic pain with inspiration.  Cardio: regular rate and rhythm, S1, S2 normal, no murmur, click, rub or gallop GI: soft, non-tender; bowel sounds normal; no masses,  no organomegaly Extremities: extremities normal, atraumatic, no cyanosis. Negative Homans' sign. Edema in both feet  Neurologic: Alert and oriented X 3, normal strength and tone.   Lab Results:  Basename 12/24/11 0409 12/23/11 0630  WBC 17.9* 16.2*  HGB 10.1* 11.1*  HCT 32.6* 35.3*  PLT 325 305   BMET  Basename 12/24/11 0409 12/23/11 0630  NA 139 138  K 3.9 4.2  CL 108 107  CO2 26 26  GLUCOSE 117* 117*  BUN 10 6  CREATININE 0.57 0.57  CALCIUM 8.2* 8.2*    Studies/Results: Dg Chest 2 View  12/22/2011  *RADIOLOGY REPORT*  Clinical Data: Chest pain  CHEST - 2 VIEW  Comparison: 06/21/2011; 06/14/2011; 02/12/2006; 02/02/2006  Findings: Grossly unchanged enlarged cardiac silhouette and mediastinal contours.  The lungs remain persistently reduced. There is mild cephalization of flow with indistinct pulmonary vasculature.  Bibasilar opacities favored to represent atelectasis. No definite pleural effusion or pneumothorax.  Grossly unchanged bones.  IMPRESSION: 1.  Mild pulmonary  edema. No definite pleural effusions.  2.  Chronic bibasilar opacities favored to represent atelectasis.  Original Report Authenticated By: Waynard Reeds, M.D.   Mr Hip Right W Wo Contrast  12/24/2011  *RADIOLOGY REPORT*  Clinical Data: AVN.  Lupus.  Chronic steroids.  Lateral hip pain.  MRI OF THE RIGHT HIP WITHOUT AND WITH CONTRAST  Technique:  Multiplanar, multisequence MR imaging was performed both before and after administration of intravenous contrast.  Contrast: 19mL MULTIHANCE GADOBENATE DIMEGLUMINE 529 MG/ML IV SOLN  Comparison: None.  Findings: Anasarca changes are present in the soft tissues.  Small bilateral hip effusions are present which are symmetric. After Gadolinium administration, there is slightly greater than expected synovial enhancement in the hips, compatible with mild synovitis. Active red marrow is identified throughout all the pelvic bones and the femur bilaterally.  There is no bone marrow edema in the femoral heads.  No avascular necrosis or collapse.  The visceral pelvis is within normal limits with cystic and / or follicles in the ovaries bilaterally.  Urinary bladder appears normal. Prominent external iliac lymph nodes are also noted.  There is mild muscular edema in the quadriceps muscles bilaterally. Low-level edema is present in the gluteal muscles bilaterally. This is probably associated with anasarca changes, but myositis could appear similar.  There is a small enhancing region in the far lateral right gluteus maximus muscle measuring 3 cm x 1.6 cm (image 20 series 11).  This does not have into intrinsic T1 signal to suggest hematoma.  IMPRESSION: 1.  Negative for AVN.  Small bilateral hip  effusions are symmetric. These appear to be a combination of the both hip effusion and mild synovitis. 2.  Small enhancing region in the right gluteus maximus adjacent to the right greater trochanter.  Favor post-traumatic phlegmon. Potentially this could be related to prior bursal  injection attempts.  Infection is a consideration but considered less likely. No abscess.  Clinical follow-up should be performed with follow-up imaging only as necessary based on clinical exam. 3.  Mild quadriceps muscular edema.   Favor anasarca changes; myositis is another consideration although there is no significant enhancement. Correlation with CPK recommended.  Original Report Authenticated By: Andreas Newport, M.D.    Medications:  Scheduled:    . docusate sodium  100 mg Oral BID  . fentaNYL  75 mcg Transdermal Q72H  . gadobenate dimeglumine  19 mL Intravenous Once  . heparin  5,000 Units Subcutaneous Q8H  . LORazepam  2 mg Oral Once  . oxyCODONE  30 mg Oral Q8H  . pantoprazole  40 mg Oral BID AC  . pneumococcal 23 valent vaccine  0.5 mL Intramuscular Tomorrow-1000  . predniSONE  40 mg Oral Q breakfast  . sodium chloride  3 mL Intravenous Q12H  . DISCONTD: methylPREDNISolone (SOLU-MEDROL) injection  125 mg Intravenous Q8H  . DISCONTD: oxyCODONE  30 mg Oral Q12H   Continuous:    . DISCONTD: 0.9 % NaCl with KCl 20 mEq / L      Assessment/Plan: 36 yo female with h/o lupus since 2001 and previous pericardial window in 2007 who presented with chest pain and right sided hip pain.   # CHEST PAIN  - echo shows no pericardial effusion - still concern for PE especially with persistent pleuritic pain and hypercoagulable state in lupus. Will get CTA.  - repeat EKG showed T wave inversions in V1-V3. Will need stress test as outpatient. Will repeat EKG today.   # LUPUS  - Pt with acute flare  - concern for complications including cardiac and pleural effusions (no evidence for at this time)  - spoke with outpatient rheumatologist who recommended switching to prednisone 40mg  daily. Switched from solumedrol yesterday.  - continue toradol, and home oxycodone.    #GI (GERD)  - pt reports long standing issues with reflux like symptoms; stable today - transition to po ppi  # LEG PAIN  (R greater trochanteric bursitis)  - improving - no evidence of AVN on MRI.  - continue icing hip  # Fen/Gi: regular diet  # PPx: heparin 5000u tid and IV protonix 40mg  bid Dispo: pending CTA results.     LOS: 2 days   Bryanne Riquelme 12/24/2011, 9:59 AM

## 2011-12-24 NOTE — Discharge Instructions (Signed)
For your chest pain, cardiac markers did not show anything concerning, the echo of your heart did not show any fluid around your heart.  The CT scan we got showed a small pulmonary embolus in your right lung. This is likely what is causing your pain when you breathe. We are going to start you ona medicine called xarelto which will treat the clot. You will be taking xarelto 15mg  twice daily and after 21 days (3 weeks) you will take xarelto 20mg  daily.   Your repeat EKG showed a change compared to before. Based on that, we would like for you to get an outpatient stress test where they will stress your heart and see if there is any evidence of blockage in the arteries.   For your hip pain, the MRI did not show any necrosis of the bone, which is greatly reassuring.   Your rheumatologist recommended that we start you on prednisone 40mg  daily. Please continue taking that dose until you follow up with her next week. Also continue taking the cellcept and the plaquenil like you were.   Pulmonary Embolus A pulmonary (lung) embolus (PE) is a blood clot that has traveled from another place in the body to the lung. Most clots come from deep veins in the legs or pelvis. PE is a dangerous and potentially life-threatening condition that can be treated if identified. CAUSES Blood clots form in a vein for different reasons. Usually several things cause blood clots. They include:  The flow of blood slows down.   The inside of the vein is damaged in some way.   The person has a condition that makes the blood clot more easily. These conditions may include:   Older age (especially over 79 years old).   Having a history of blood clots.   Having major or lengthy surgery. Hip surgery is particularly high-risk.   Breaking a hip or leg.   Sitting or lying still for a long time.   Cancer or cancer treatment.   Having a long, thin tube (catheter) placed inside a vein during a medical procedure.   Being  overweight (obese).   Pregnancy and childbirth.   Medicines with estrogen.   Smoking.   Other circulation or heart problems.  SYMPTOMS  The symptoms of a PE usually start suddenly and include:  Shortness of breath.   Coughing.   Coughing up blood or blood-tinged mucus (phlegm).   Chest pain. Pain is often worse with deep breaths.   Rapid heartbeat.  DIAGNOSIS  If a PE is suspected, your caregiver will take a medical history and carry out a physical exam. Your caregiver will check for the risk factors listed above. Tests that also may be required include:  Blood tests, including studies of the clotting properties of your blood.   Imaging tests. Ultrasound, CT, MRI, and other tests can all be used to see if you have clots in your legs or lungs. If you have a clot in your legs and have breathing or chest problems, your caregiver may conclude that you have a clot in your lungs. Further lung tests may not be needed.   An EKG can look for heart strain from blood clots in the lungs.  PREVENTION   Exercise the legs regularly. Take a brisk 30 minute walk every day.   Maintain a weight that is appropriate for your height.   Avoid sitting or lying in bed for long periods of time without moving your legs.   Women, particularly those  over the age of 61, should consider the risks and benefits of taking estrogen medicines, including birth control pills.   Do not smoke, especially if you take estrogen medicines.   Long-distance travel can increase your risk. You should exercise your legs by walking or pumping the muscles every hour.   In hospital prevention:   Your caregiver will assess your need for preventive PE care (prophylaxis) when you are admitted to the hospital. If you are having surgery, your surgeon will assess you the day of or day after surgery.   Prevention may include medical and nonmedical measures.  TREATMENT   The most common treatment for a PE is blood thinning  (anticoagulant) medicine, which reduces the blood's tendency to clot. Anticoagulants can stop new blood clots from forming and old ones from growing. They cannot dissolve existing clots. Your body does this by itself over time. Anticoagulants can be given by mouth, by intravenous (IV) access, or by injection. Your caregiver will determine the best program for you.   Less commonly, clot-dissolving drugs (thrombolytics) are used to dissolve a PE. They carry a high risk of bleeding, so they are used mainly in severe cases.   Very rarely, a blood clot in the leg needs to be removed surgically.   If you are unable to take anticoagulants, your caregiver may arrange for you to have a filter placed in a main vein in your belly (abdomen). This filter prevents clots from traveling to your lungs.  HOME CARE INSTRUCTIONS   Take all medicines prescribed by your caregiver. Follow the directions carefully.   You will most likely continue taking anticoagulants after you leave the hospital. Your caregiver will advise you on the length of treatment (usually 3 to 6 months, sometimes for life).   Taking too much or too little of an anticoagulant is dangerous. While taking this type of medicine, you will need to have regular blood tests to be sure the dose is correct. The dose can change for many reasons. It is critically important that you take this medicine exactly as prescribed and that you have blood tests exactly as directed.   Many foods can interfere with anticoagulants. These include foods high in vitamin K, such as spinach, kale, broccoli, cabbage, collard and turnip greens, Brussels sprouts, peas, cauliflower, seaweed, parsley, beef and pork liver, green tea, and soybean oil. Your caregiver should discuss limits on these foods with you or you should arrange a visit with a dietician to answer your questions.   Many medicines can interfere with anticoagulants. You must tell your caregiver about any and all  medicines you take. This includesall vitamins and supplements. Be especially cautious with aspirin and anti-inflammatory medicines. Ask your caregiver before taking these.   Anticoagulants can have side effects, mostly excessive bruising or bleeding. You will need to hold pressure over cuts for longer than usual. Avoid alcoholic drinks or consume only very small amounts while taking this medicine.   If you are taking an anticoagulant:   Wear a medical alert bracelet.   Notify your dentist or other caregivers before procedures.   Avoid contact sports.   Ask your caregiver how soon you can go back to normal activities. Not being active can lead to new clots. Ask for a list of what you should and should not do.   Exercise your lower leg muscles. This is important while traveling.   You may need to wear compression stockings. These are tight elastic stockings that apply pressure to the  lower legs. This can help keep the blood in the legs from clotting.   If you are a smoker, you should quit.   Learn as much as you can about pulmonary embolisms.  SEEK MEDICAL CARE IF:   You notice a rapid heartbeat.   You feel weaker or more tired than usual.   You feel faint.   You notice increased bruising.   Your symptoms are not getting better in the time expected.   You are having side effects of medicine.   You have an oral temperature above 102 F (38.9 C).   You discover other family members with blood clots. This may require further testing for inherited diseases or conditions.  SEEK IMMEDIATE MEDICAL CARE IF:   You have chest pain.   You have trouble breathing.   You have new or increased swelling or pain in one leg.   You cough up blood.   You notice blood in vomit, in a bowel movement, or in urine.   You have an oral temperature above 102 F (38.9 C), not controlled by medicine.  You may have another PE. A blood clot in the lungs is a medical emergency. Call your local  emergency services (911 in [place] to get to the nearest hospital or clinic. Do not drive yourself. MAKE SURE YOU:   Understand these instructions.   Will watch your condition.   Will get help right away if you are not doing well or get worse.  Document Released: 07/25/2000 Document Revised: 07/17/2011 Document Reviewed: 01/29/2009 [facility] Patient Information 2012 [place], [place].

## 2011-12-24 NOTE — Progress Notes (Signed)
Pt. discharge to floor,verbalized understanding of discharged instruction,medication,restriction,diet and follow up appointment.Baseline Vitals sign stable,Pt comfortable,no sign and symptom of distress. 

## 2011-12-25 NOTE — Discharge Summary (Signed)
Physician Discharge Summary   Patient ID: Elizabeth Baxter [id number] 36 y.o. [birth year]  Admit date: 12/22/2011  Discharge date and time: 12/24/2011  6:42 PM   Admitting Physician: Carney Living, MD   Discharge Physician: Paula Compton, MD  Admission Diagnoses: Lupus [710.0] CHEST PAIN  Discharge Diagnoses:  1. Pulmonary Embolus 2. Right greater trochanteric bursitis 3. Lupus  Admission Condition: fair  Discharged Condition: good  Indication for Admission: Chest pain and right hip pain  Hospital Course:  36 yo female with h/o lupus since 2001 and previous pericardial window in 2007 who presented with chest pain and right sided hip pain.   # Chest pain: secondary to pulmonary embolus.  Patient described pleuritic pain on admission. Cardiac enzymes were normal x3. Morning repeat EKG showed T wave inversion in leads V1-V3 but no ST changes. Chest xray did not show any acute findings. There was concern for pericardial effusion and an echo was done which was normal. It was thought that patient's chest pain could be a component of a lupus flare for which solumedrol was started. Patient continued having lingering pleuritic pain with subjective shortness of breath, even after steroid therapy. Despite absence of tachycardia or hypoxia, pulmonary embolus could not be excluded as part of the differentia (especially given hypercoagulable state in lupus and persistent pain) CTA was obtained (as D-dimer was expected to be elevated in the setting of inflammation from lupus) and showed a subsegmental sized pulmonary embolus in the right lower lobe. Patient was started on xarelto 15mg  twice daily for 21 days, followed by 20mg  daily for at least 3 months. Pharmacy was consulted and did not find any contraindications to starting xarelto in a lupus patient.  With T wave inversions on EKG, outpatient stress test with cardiology was scheduled.   # LUPUS: patient presented with body aches and swelling  of her hands and feet typical of her lupus flares. She was started on solumedrol 125mg  q8 and received 4 doses. After consultation with her outpatient rheumatologist, Dr. Sharmon Revere, solumedrol was stopped and prednisone 40mg  daily was started. Patient was discharged on prednisone 40mg  daily, cellcept and plaquenil with instructions to follow up with her rheumatologist within 1 week of discharge.  # Right hip pain: patient had new onset right hip pain. In order to rule out Avascular Necrosis in the setting of chronic steroid use, MRI was obtained which did not show any evidence of AVN. It showed post traumatic phlegmon and synovitis. It was thought that pain could also be secondary to greater trochanteric bursitis. Patient was put back on her home pain medication. Pain on discharge had improved.   #GI (GERD): reported acid reflux symptoms. Was started on IV protonix and discharged on home nexium.   Consults: None  Significant Diagnostic Studies:   CHEST - 2 VIEW 12/22/11 Comparison: 06/21/2011; 06/14/2011; 02/12/2006; 02/02/2006  Findings: Grossly unchanged enlarged cardiac silhouette and  mediastinal contours. The lungs remain persistently reduced.  There is mild cephalization of flow with indistinct pulmonary  vasculature. Bibasilar opacities favored to represent atelectasis.  No definite pleural effusion or pneumothorax. Grossly unchanged  bones.  IMPRESSION:  1. Mild pulmonary edema. No definite pleural effusions.  2. Chronic bibasilar opacities favored to represent atelectasis.   CT ANGIOGRAPHY CHEST 12/25/11 Technique: Multidetector CT imaging of the chest using the  standard protocol during bolus administration of intravenous  contrast. Multiplanar reconstructed images including MIPs were  obtained and reviewed to evaluate the vascular anatomy.  Contrast: OMNIPAQUE IOHEXOL 350 MG/ML  SOLN  Comparison: PE study 01/25/2006.  Findings:  Mediastinum: Heart size is mildly  enlarged. There is no significant  pericardial fluid, thickening or pericardial calcification. Within  the posterobasal segment of the right lower lobe there is a small  occlusive subsegmental sized filling defect suspicious for  pulmonary embolism. No other definite filling defects are  identified. There are numerous borderline enlarged bilateral hilar  lymph nodes. No definite mediastinal adenopathy is appreciated.  Esophagus is mildly patulous.  Lungs/Pleura: Scattered throughout the periphery of the lungs  bilaterally there are areas of subpleural reticulation, ground-  glass attenuation and some areas of frank honeycombing (most  pronounced in the lung bases), compatible with underlying  interstitial lung disease. No definite acute air space  consolidation is identified. No pleural effusions.  Upper Abdomen: Unremarkable.  Musculoskeletal: There are no aggressive appearing lytic or blastic  lesions noted in the visualized portions of the skeleton.  IMPRESSION:  1. Small subsegmental sized occlusive filling defect in a  pulmonary artery branch to the posterobasal segment of the right  lower lobe compatible with subsegmental sized pulmonary embolism.  No larger embolus is otherwise noted.  2. 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 In this  young patient, these findings are presumably secondary to  underlying systemic lupus erythematosus.  3. Multiple borderline enlarged bilateral hilar lymph nodes are  presumably reactive in the setting of underlying interstitial lung  disease.  4. Cardiomegaly.   MRI OF THE RIGHT HIP WITHOUT AND WITH CONTRAST  Technique: Multiplanar, multisequence MR imaging was performed  both before and after administration of intravenous contrast.  Contrast: 19mL MULTIHANCE GADOBENATE DIMEGLUMINE 529 MG/ML IV SOLN    Comparison: None.  Findings: Anasarca changes are present in the soft tissues. Small  bilateral hip effusions are present which are symmetric. After  Gadolinium administration, there is slightly greater than expected  synovial enhancement in the hips, compatible with mild synovitis.  Active red marrow is identified throughout all the pelvic bones and  the femur bilaterally. There is no bone marrow edema in the  femoral heads. No avascular necrosis or collapse.  The visceral pelvis is within normal limits with cystic and / or  follicles in the ovaries bilaterally. Urinary bladder appears  normal. Prominent external iliac lymph nodes are also noted.  There is mild muscular edema in the quadriceps muscles bilaterally.  Low-level edema is present in the gluteal muscles bilaterally. This  is probably associated with anasarca changes, but myositis could  appear similar.  There is a small enhancing region in the far lateral right gluteus  maximus muscle measuring 3 cm x 1.6 cm (image 20 series 11). This  does not have into intrinsic T1 signal to suggest hematoma.  IMPRESSION:  1. Negative for AVN. Small bilateral hip effusions are symmetric.  These appear to be a combination of the both hip effusion and mild  synovitis.  2. Small enhancing region in the right gluteus maximus adjacent to  the right greater trochanter. Favor post-traumatic phlegmon.  Potentially this could be related to prior bursal injection  attempts. Infection is a consideration but considered less likely.  No abscess. Clinical follow-up should be performed with follow-up  imaging only as necessary based on clinical exam.  3. Mild quadriceps muscular edema. Favor anasarca changes;  myositis is another consideration although there is no significant  enhancement. Correlation with CPK recommended.  CBC    Component Value Date/Time   WBC 17.9* 12/24/2011 0409   RBC 3.88 12/24/2011 0409   HGB 10.1* 12/24/2011 0409   HCT  32.6* 12/24/2011 0409   PLT 325 12/24/2011 0409   MCV 84.0 12/24/2011 0409   MCH 26.0 12/24/2011 0409   MCHC 31.0 12/24/2011 0409   RDW 14.1 12/24/2011 0409   LYMPHSABS 1.9 12/24/2011 0409   MONOABS 0.8 12/24/2011 0409   EOSABS 0.0 12/24/2011 0409   BASOSABS 0.0 12/24/2011 0409    CMP     Component Value Date/Time   NA 139 12/24/2011 0409   K 3.9 12/24/2011 0409   CL 108 12/24/2011 0409   CO2 26 12/24/2011 0409   GLUCOSE 117* 12/24/2011 0409   BUN 10 12/24/2011 0409   CREATININE 0.57 12/24/2011 0409   CALCIUM 8.2* 12/24/2011 0409   PROT 7.5 12/22/2011 1420   ALBUMIN 2.8* 12/22/2011 1420   AST 31 12/22/2011 1420   ALT 14 12/22/2011 1420   ALKPHOS 67 12/22/2011 1420   BILITOT 0.3 12/22/2011 1420   GFRNONAA >90 12/24/2011 0409   GFRAA >90 12/24/2011 0409   Treatments: analgesia: Dilaudid and oxycodone and steroids: solu-medrol 125mg  q8  Discharge Exam: Filed Vitals:   12/23/11 1444 12/23/11 2217 12/24/11 0549 12/24/11 1430  BP: 106/70 107/67 97/60 109/72  Pulse: 67 52 55 51  Temp: 97.6 F (36.4 C) 97.5 F (36.4 C) 97.7 F (36.5 C) 97.3 F (36.3 C)  TempSrc:  Oral Oral Oral  Resp: 18 20 18 20   Height:      Weight:      SpO2: 99% 98% 99% 100%   General appearance: alert, cooperative and tired appearing  Resp: clear to auscultation bilaterally, pleuritic pain with inspiration.  Cardio: regular rate and rhythm, S1, S2 normal, no murmur, click, rub or gallop  GI: soft, non-tender; bowel sounds normal; no masses, no organomegaly  Extremities: extremities normal, atraumatic, no cyanosis. Negative Homans' sign. Edema in both feet  Neurologic: Alert and oriented X 3, normal strength and tone.  Hip - mild tenderness with palpation of right greater trochanter. Normal range of motion of hip.   Disposition: 01-Home or Self Care  Patient Instructions:  Medication List  As of 12/25/2011  5:14 PM   TAKE these medications         albuterol 108 (90 BASE) MCG/ACT inhaler   Commonly known as:  PROVENTIL HFA;VENTOLIN HFA   Inhale 2 puffs into the lungs every 4 (four) hours as needed. For shortness of breath      CELLCEPT 500 MG tablet   Generic drug: mycophenolate   Take 1,500 mg by mouth 2 (two) times daily.      esomeprazole 20 MG capsule   Commonly known as: NEXIUM   Take 20 mg by mouth daily before breakfast.      fentaNYL 75 MCG/HR   Commonly known as: DURAGESIC - dosed mcg/hr   Place 1 patch onto the skin every 3 (three) days.      hydroxychloroquine 200 MG tablet   Commonly known as: PLAQUENIL   Take 200 mg by mouth 2 (two) times daily.      OXYCODONE HCL PO   Take 30 mg by mouth 3 (three) times daily.      predniSONE 20 MG tablet   Commonly known as: DELTASONE   Take 2 tablets (40 mg total) by mouth daily with breakfast.      Rivaroxaban 15 MG Tabs tablet   Commonly known as:  XARELTO   Take 1 tablet (15 mg total) by mouth 2 (two) times daily with a meal. Take this for 21 days      Rivaroxaban 20 MG Tabs   Take 20 mg by mouth daily.           Activity: activity as tolerated Diet: regular diet Wound Care: none needed  Follow-up Information    Follow up with [facility] [address] [place]. [phone]. (follow up for cardiac stress test: May 22 at 11:30am. Do not eat or drink anything for 4 hours before procedure. No caffeinated drinks 12 hours before appointment. )       Follow up with Francee Gentile, MD. Schedule an appointment as soon as possible for a visit in 1 week.   Contact information:   [address] [place] [address] [phone]         Follow up items:  - status of chest discomfort - stress test at [facility] cardiology - reassess status of PE and anticoagulation in 3 months.  SignedMarena Chancy 12/25/2011 5:14 PM

## 2011-12-31 ENCOUNTER — Ambulatory Visit (HOSPITAL_COMMUNITY)

## 2012-02-11 ENCOUNTER — Emergency Department (INDEPENDENT_AMBULATORY_CARE_PROVIDER_SITE_OTHER)
Admission: EM | Admit: 2012-02-11 | Discharge: 2012-02-11 | Disposition: A | Discharge status: Home / Self Care | Source: Home / Self Care

## 2012-02-11 ENCOUNTER — Encounter (HOSPITAL_COMMUNITY): Payer: Self-pay | Admitting: *Deleted

## 2012-02-11 DIAGNOSIS — L0291 Cutaneous abscess, unspecified: Secondary | ICD-10-CM

## 2012-02-11 DIAGNOSIS — M79605 Pain in left leg: Secondary | ICD-10-CM

## 2012-02-11 DIAGNOSIS — M79609 Pain in unspecified limb: Secondary | ICD-10-CM

## 2012-02-11 DIAGNOSIS — L0390 Cellulitis, unspecified: Secondary | ICD-10-CM

## 2012-02-11 MED ORDER — SULFAMETHOXAZOLE-TRIMETHOPRIM 800-160 MG PO TABS: 1.0000 | ORAL_TABLET | Freq: Two times a day (BID) | ORAL | Status: AC

## 2012-02-11 NOTE — ED Notes (Signed)
Pt  Has  Lupus     She  Reports  l  Shin pain    For  sev  Days  She        denys    Any  specefic   Injury      She has  Some  Redness    Present      She  Is  Ambulatory       Yet   Very  Slowly  She  Is  Not  Short  Of breath  She  Is  Alert  And  Oriented  And  Speaks  In  Complete  sentances

## 2012-02-11 NOTE — ED Provider Notes (Signed)
History     CSN: [id number]  Arrival date & time 02/11/12  1604   None     Chief Complaint  Patient presents with  . Leg Pain    (Consider location/radiation/quality/duration/timing/severity/associated sxs/prior treatment) Patient is a 36 y.o. female presenting with leg pain. The history is provided by the patient.  Leg Pain    This patient presents for evaluation of a cutaneous cellulitis.  The lesion is located in the left leg.  Onset was 2 days ago.  Symptoms have not improved.  Cellulitis has associated symptoms of tenderness and erythema.  The patient does not have previous history of cutaneous cellulitis and/or abscess.  There is no known previous history of MRSA.   They do not have a history of diabetes. Has applied ice compresses and used regular soap for cleaning with no change.  Denies fever but states she has had chills. PMH-lupus Past Medical History  Diagnosis Date  . Lupus     Past Surgical History  Procedure Date  . Pericardial window     in 2007    History reviewed. No pertinent family history.  History  Substance Use Topics  . Smoking status: Never Smoker   . Smokeless tobacco: Not on file  . Alcohol Use: No    OB History    Grav Para Term Preterm Abortions TAB SAB Ect Mult Living                  Review of Systems  All other systems reviewed and are negative.    Allergies  Imuran and Tramadol  Home Medications   Current Outpatient Rx  Name Route Sig Dispense Refill  . ALBUTEROL SULFATE HFA 108 (90 BASE) MCG/ACT IN AERS Inhalation Inhale 2 puffs into the lungs every 4 (four) hours as needed. For shortness of breath    . ESOMEPRAZOLE MAGNESIUM 20 MG PO CPDR Oral Take 20 mg by mouth daily before breakfast.     . FENTANYL 75 MCG/HR TD PT72 Transdermal Place 1 patch onto the skin every 3 (three) days.     Marland Kitchen HYDROXYCHLOROQUINE SULFATE 200 MG PO TABS Oral Take 200 mg by mouth 2 (two) times daily.     Marland Kitchen MYCOPHENOLATE MOFETIL 500 MG PO  TABS Oral Take 1,500 mg by mouth 2 (two) times daily.     . OXYCODONE HCL PO Oral Take 30 mg by mouth 3 (three) times daily.     Marland Kitchen RIVAROXABAN 15 MG PO TABS Oral Take 1 tablet (15 mg total) by mouth 2 (two) times daily with a meal. Take this for 21 days 40 tablet 0  . RIVAROXABAN 20 MG PO TABS Oral Take 20 mg by mouth daily. 30 tablet 1  . SULFAMETHOXAZOLE-TRIMETHOPRIM 800-160 MG PO TABS Oral Take 1 tablet by mouth every 12 (twelve) hours. 14 tablet 0    BP 98/72  Pulse 72  Temp 98.6 F (37 C) (Oral)  Resp 18  SpO2 100%  LMP 01/11/2012  Physical Exam  Nursing note and vitals reviewed. Constitutional: She is oriented to person, place, and time. Vital signs are normal. She appears well-developed and well-nourished. She is active and cooperative.  HENT:  Head: Normocephalic.  Eyes: Conjunctivae are normal. Pupils are equal, round, and reactive to light. No scleral icterus.  Neck: Trachea normal. Neck supple.  Cardiovascular: Normal rate and regular rhythm.   Pulmonary/Chest: Effort normal and breath sounds normal.  Musculoskeletal:       Right knee: Normal.  Left knee: Normal.       Right ankle: Normal.       Left ankle: Normal.       Right lower leg: Normal.       Left lower leg: She exhibits tenderness.       Legs: Neurological: She is alert and oriented to person, place, and time. She has normal strength. No cranial nerve deficit or sensory deficit. GCS eye subscore is 4. GCS verbal subscore is 5. GCS motor subscore is 6.  Skin: Skin is warm and dry. There is erythema.       See above  Psychiatric: She has a normal mood and affect. Her speech is normal and behavior is normal. Judgment and thought content normal. Cognition and memory are normal.    ED Course  Procedures (including critical care time)  Labs Reviewed - No data to display No results found.   1. Cellulitis   2. Right leg pain       MDM  Antibiotics as prescribed, cool compresses to area.  RTC if  symptoms persist or worsen       Johnsie Kindred, NP 02/11/12 1816

## 2012-02-12 NOTE — ED Provider Notes (Signed)
Medical screening examination/treatment/procedure(s) were performed by non-physician practitioner and as supervising physician I was immediately available for consultation/collaboration.  Channel Papandrea   Candid Bovey, MD 02/12/12 1450 

## 2012-04-01 ENCOUNTER — Inpatient Hospital Stay (HOSPITAL_COMMUNITY)
Admission: EM | Admit: 2012-04-01 | Discharge: 2012-04-02 | DRG: 547 | Disposition: A | Discharge status: Home / Self Care | Attending: Family Medicine | Admitting: Family Medicine

## 2012-04-01 ENCOUNTER — Emergency Department (HOSPITAL_COMMUNITY)

## 2012-04-01 ENCOUNTER — Encounter (HOSPITAL_COMMUNITY): Payer: Self-pay | Admitting: Emergency Medicine

## 2012-04-01 DIAGNOSIS — M329 Systemic lupus erythematosus, unspecified: Secondary | ICD-10-CM

## 2012-04-01 DIAGNOSIS — R0789 Other chest pain: Secondary | ICD-10-CM

## 2012-04-01 DIAGNOSIS — R079 Chest pain, unspecified: Secondary | ICD-10-CM

## 2012-04-01 DIAGNOSIS — Z86711 Personal history of pulmonary embolism: Secondary | ICD-10-CM

## 2012-04-01 HISTORY — DX: Gastro-esophageal reflux disease without esophagitis: K21.9

## 2012-04-01 HISTORY — DX: Other pulmonary embolism without acute cor pulmonale: I26.99

## 2012-04-01 LAB — POCT I-STAT TROPONIN I: Troponin i, poc: 0 ng/mL (ref 0.00–0.08)

## 2012-04-01 LAB — D-DIMER, QUANTITATIVE: D-Dimer, Quant: 0.97 ug/mL-FEU — ABNORMAL HIGH (ref 0.00–0.48)

## 2012-04-01 LAB — CBC
MCV: 83.6 fL (ref 78.0–100.0)
Platelets: 259 10*3/uL (ref 150–400)
RDW: 14.8 % (ref 11.5–15.5)
WBC: 10.4 10*3/uL (ref 4.0–10.5)

## 2012-04-01 LAB — BASIC METABOLIC PANEL
Calcium: 8.9 mg/dL (ref 8.4–10.5)
Chloride: 103 mEq/L (ref 96–112)
Creatinine, Ser: 0.77 mg/dL (ref 0.50–1.10)
GFR calc Af Amer: >90 mL/min (ref >90)
Sodium: 140 mEq/L (ref 135–145)

## 2012-04-01 MED ORDER — RIVAROXABAN 10 MG PO TABS
10.0000 mg | ORAL_TABLET | Freq: Every day | ORAL | Status: DC
  Administered 2012-04-02: 10 mg via ORAL
  Filled 2012-04-01: qty 1

## 2012-04-01 MED ORDER — MORPHINE SULFATE 4 MG/ML IJ SOLN
4.0000 mg | Freq: Once | INTRAMUSCULAR | Status: AC
  Administered 2012-04-01: 4 mg via INTRAVENOUS
  Filled 2012-04-01: qty 1

## 2012-04-01 MED ORDER — HYDROXYCHLOROQUINE SULFATE 200 MG PO TABS
200.0000 mg | ORAL_TABLET | Freq: Two times a day (BID) | ORAL | Status: DC
Start: 2012-04-02 — End: 2012-04-02
  Administered 2012-04-02 (×2): 200 mg via ORAL
  Filled 2012-04-01 (×3): qty 1

## 2012-04-01 MED ORDER — ASPIRIN EC 81 MG PO TBEC
81.0000 mg | DELAYED_RELEASE_TABLET | Freq: Every day | ORAL | Status: DC
  Administered 2012-04-02: 81 mg via ORAL
  Filled 2012-04-01: qty 1

## 2012-04-01 MED ORDER — MORPHINE SULFATE 4 MG/ML IJ SOLN
4.0000 mg | INTRAMUSCULAR | Status: DC | PRN
  Administered 2012-04-02: 4 mg via INTRAVENOUS
  Filled 2012-04-01: qty 1

## 2012-04-01 MED ORDER — KETOROLAC TROMETHAMINE 30 MG/ML IJ SOLN
30.0000 mg | Freq: Once | INTRAMUSCULAR | Status: AC
  Administered 2012-04-02: 30 mg via INTRAVENOUS
  Filled 2012-04-01: qty 1

## 2012-04-01 MED ORDER — MYCOPHENOLATE MOFETIL 250 MG PO CAPS
1500.0000 mg | ORAL_CAPSULE | Freq: Two times a day (BID) | ORAL | Status: DC
  Administered 2012-04-02 (×2): 1500 mg via ORAL
  Filled 2012-04-01 (×3): qty 6

## 2012-04-01 MED ORDER — SODIUM CHLORIDE 0.9 % IV SOLN
INTRAVENOUS | Status: DC
  Administered 2012-04-02: 01:00:00 via INTRAVENOUS

## 2012-04-01 MED ORDER — FENTANYL 75 MCG/HR TD PT72
75.0000 ug | MEDICATED_PATCH | TRANSDERMAL | Status: DC
  Administered 2012-04-02: 75 ug via TRANSDERMAL
  Filled 2012-04-01: qty 1

## 2012-04-01 MED ORDER — METHYLPREDNISOLONE SODIUM SUCC 125 MG IJ SOLR
125.0000 mg | Freq: Every day | INTRAMUSCULAR | Status: AC
  Administered 2012-04-02: 125 mg via INTRAVENOUS
  Filled 2012-04-01: qty 2

## 2012-04-01 MED ORDER — PANTOPRAZOLE SODIUM 40 MG PO TBEC
40.0000 mg | DELAYED_RELEASE_TABLET | Freq: Every day | ORAL | Status: DC
  Administered 2012-04-02: 40 mg via ORAL
  Filled 2012-04-01: qty 1

## 2012-04-01 MED ORDER — SODIUM CHLORIDE 0.9 % IJ SOLN: 3.0000 mL | Freq: Two times a day (BID) | INTRAMUSCULAR | Status: DC

## 2012-04-01 MED ORDER — IOHEXOL 350 MG/ML SOLN
100.0000 mL | Freq: Once | INTRAVENOUS | Status: AC | PRN
  Administered 2012-04-01: 100 mL via INTRAVENOUS

## 2012-04-01 MED ORDER — METHYLPREDNISOLONE SODIUM SUCC 125 MG IJ SOLR
125.0000 mg | Freq: Once | INTRAMUSCULAR | Status: AC
  Administered 2012-04-01: 125 mg via INTRAVENOUS
  Filled 2012-04-01: qty 2

## 2012-04-01 NOTE — ED Notes (Signed)
Pt reports having aching for 2 days; reports R sided CP, and hurts with inspiration; reports hx of blood clots in R lung about 2 months ago; recently on car trip to [place] as well; reports on xeralto for blood thinner

## 2012-04-01 NOTE — ED Provider Notes (Addendum)
History     CSN: [id number]  Arrival date & time 04/01/12  1648   First MD Initiated Contact with Patient 04/01/12 1836      Chief Complaint  Patient presents with  . Chest Pain    (Consider location/radiation/quality/duration/timing/severity/associated sxs/prior treatment) HPI Comments: Elizabeth Baxter presents for evaluation of chest discomfort.  It has been ongoing for the last 2 days.  She reports a recent car trip to [place], [place] but denies any leg or calf pain.  She also denies fever but reports feeling mildly short of breath.  The pain is also exacerbated by taking deep breaths.  She reports a hx of lupus and a previous PE.  She currently takes xeralto.  Patient is a 36 y.o. female presenting with chest pain. The history is provided by the patient. No language interpreter was used.  Chest Pain The chest pain began 2 days ago. Chest pain occurs constantly. The chest pain is unchanged. The severity of the pain is moderate. The quality of the pain is described as dull, pleuritic and tightness. The pain does not radiate. Chest pain is worsened by deep breathing. Primary symptoms include shortness of breath. Pertinent negatives for primary symptoms include no fever, no fatigue, no cough, no wheezing, no palpitations, no abdominal pain, no nausea, no vomiting and no altered mental status.  Pertinent negatives for associated symptoms include no claudication, no diaphoresis, no lower extremity edema, no near-syncope, no orthopnea and no weakness. She tried nothing for the symptoms.  Her past medical history is significant for PE.  Her family medical history is significant for heart disease in family.     Past Medical History  Diagnosis Date  . Lupus   . Pulmonary embolism     Past Surgical History  Procedure Date  . Pericardial window     in 2007    History reviewed. No pertinent family history.  History  Substance Use Topics  . Smoking status: Never Smoker   . Smokeless  tobacco: Not on file  . Alcohol Use: No    OB History    Grav Para Term Preterm Abortions TAB SAB Ect Mult Living                  Review of Systems  Constitutional: Negative for fever, chills, diaphoresis, activity change, appetite change and fatigue.  HENT: Negative.   Respiratory: Positive for chest tightness and shortness of breath. Negative for apnea, cough and wheezing.   Cardiovascular: Positive for chest pain. Negative for palpitations, orthopnea, claudication, leg swelling and near-syncope.  Gastrointestinal: Negative for nausea, vomiting, abdominal pain, diarrhea and abdominal distention.  Genitourinary: Negative.   Musculoskeletal: Negative.   Skin: Negative.   Neurological: Negative.  Negative for weakness.  Hematological: Negative.   Psychiatric/Behavioral: Negative.  Negative for altered mental status.    Allergies  Imuran and Tramadol  Home Medications   Current Outpatient Rx  Name Route Sig Dispense Refill  . ALBUTEROL SULFATE HFA 108 (90 BASE) MCG/ACT IN AERS Inhalation Inhale 2 puffs into the lungs every 4 (four) hours as needed. For shortness of breath    . ESOMEPRAZOLE MAGNESIUM 20 MG PO CPDR Oral Take 20 mg by mouth daily before breakfast.     . FENTANYL 75 MCG/HR TD PT72 Transdermal Place 1 patch onto the skin every 3 (three) days.     Marland Kitchen HYDROXYCHLOROQUINE SULFATE 200 MG PO TABS Oral Take 200 mg by mouth 2 (two) times daily.     Marland Kitchen MYCOPHENOLATE MOFETIL  500 MG PO TABS Oral Take 1,500 mg by mouth 2 (two) times daily.     . OXYCODONE HCL PO Oral Take 30 mg by mouth 3 (three) times daily.     Marland Kitchen PREDNISONE 10 MG PO TABS Oral Take 20 mg by mouth daily.    Marland Kitchen RIVAROXABAN 10 MG PO TABS Oral Take 10 mg by mouth daily.      BP 113/76  Pulse 93  Temp 98.6 F (37 C) (Oral)  Resp 16  SpO2 98%  LMP 03/10/2012  Physical Exam  Nursing note and vitals reviewed. Constitutional: She is oriented to person, place, and time. She appears well-developed and  well-nourished. No distress.  HENT:  Head: Normocephalic and atraumatic.  Right Ear: External ear normal.  Left Ear: External ear normal.  Nose: Nose normal.  Mouth/Throat: Oropharynx is clear and moist. No oropharyngeal exudate.  Eyes: Conjunctivae normal are normal. Pupils are equal, round, and reactive to light. Right eye exhibits no discharge. Left eye exhibits no discharge. No scleral icterus.  Neck: Normal range of motion. Neck supple. No JVD present. No tracheal deviation present.  Cardiovascular: Normal rate, regular rhythm, normal heart sounds and intact distal pulses.  Exam reveals no gallop and no friction rub.   No murmur heard. Pulmonary/Chest: Effort normal and breath sounds normal. No stridor. No respiratory distress. She has no wheezes. She has no rales. She exhibits tenderness.  Abdominal: Soft. Bowel sounds are normal. She exhibits no distension and no mass. There is no tenderness. There is no rebound and no guarding.  Musculoskeletal: Normal range of motion. She exhibits no edema and no tenderness.  Lymphadenopathy:    She has no cervical adenopathy.  Neurological: She is alert and oriented to person, place, and time.  Skin: Skin is warm and dry. No rash noted. She is not diaphoretic. No erythema. No pallor.  Psychiatric: She has a normal mood and affect. Her behavior is normal.    ED Course  Procedures (including critical care time)  Labs Reviewed  CBC - Abnormal; Notable for the following:    Hemoglobin 11.3 (*)     MCH 25.1 (*)     All other components within normal limits  BASIC METABOLIC PANEL - Abnormal; Notable for the following:    Glucose, Bld 118 (*)     All other components within normal limits  D-DIMER, QUANTITATIVE - Abnormal; Notable for the following:    D-Dimer, Quant 0.97 (*)     All other components within normal limits  POCT I-STAT TROPONIN I  PROTIME-INR   Dg Chest 2 View  04/01/2012  *RADIOLOGY REPORT*  Clinical Data: Chest pain  CHEST -  2 VIEW  Comparison: No CT 12/24/2011 and chest x-ray 12/24/2011  Findings: Increased lung markings in the bases are unchanged and may represent scarring.  No superimposed acute infiltrate or effusion.  Cardiac enlargement without heart failure.  IMPRESSION: Chronic pulmonary scarring especially in the lung bases.  No superimposed acute abnormality.   Original Report Authenticated By: Camelia Phenes, M.D.      No diagnosis found.   Date: 04/01/2012  Rate: 97 bpm  Rhythm: normal sinus rhythm  QRS Axis: normal  Intervals: normal  ST/T Wave abnormalities: nonspecific inf and lat ST changes  Conduction Disutrbances:none  Narrative Interpretation:   Old EKG Reviewed: changes noted T inversion previously seen in V2 is not apparent today.      MDM  Pt presents for evaluation of chest pain.  She reports continued  mild pain.  Note stable VS, NAD.  Pt has a hx of lupus (also reports worsening rash and discomfort) and PE.  She adds that this pain feels like it then when she was diagnosed with a PE.  She was treated with xaralto and has missed doses recently.  Plan screening for CP and PE + symptomatic care.  2145.  Although ddimer elevated, CTA is negative for PE.  Pt has no evidence of PNA either.  Note also negative EKG and trop.  Pt does have a rash and exam consistent with an exacerbation of lupus.  Discussed with the on-call provider for family medicine.  Plan bedside evaluation prior to deciding disposition.  Anticipate admit for mgmnt of an acute lupus exacerbation and nonspecific chest pain.      Tobin Chad, MD 04/01/12 1610  Tobin Chad, MD 05/05/12 9604

## 2012-04-01 NOTE — ED Notes (Signed)
Pt at CT scan at this time

## 2012-04-01 NOTE — H&P (Addendum)
Elizabeth Baxter is an 36 y.o. female.   Chief Complaint: Chest pain HPI: Patient is a 36 yo female with a history of lupus and PE comes in with complaint of chest pain that is sharp on right side.  States this feels like last time she had chest pain and was found to have a PE.  States they figured out 4 days later had PE. Now she has had 2 days of of this right sided sharp pain starting late Tuesday.   Pain got worse today and this is why she came to the ED. States didn't get better or worse with rest or activity. States it can bother her more in certain positions while she is still. Hurts most when she is flat on her back and also when she breaths a certain way. Doesn't feel better with leaning forward. States she picked something up off the floor and noticed pain today. 4/10 now. 10/10 at work on Tuesday night. 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 Yesterday feels did too much physical activity at work. States her home oxycodone helped with the pain and that the pain comes and goes with pain medicine.  Endorses dyspnea. No N/V. Got to ED at 4 pm.  Feels that pain medicine made it better while in the ED.    Saw Dr. Sharmon Revere (rheumatologist) 1 month ago. Given steroid shot because wasn't feeling well at that time.  States when she has a typical lupus flare she gets aches and swells.  Was swollen this morning.   Lupus rash on face and arms for month.  No medication changes, though has missed 5-10/30 days of her plaquinil and cellcept, states takes her prednisone every day. Feels like was swollen in face this morning as well as hands, which she couldn't close. Stable for past year with lupus.  Diagnosed at 23 during a pregnancy lab set.   Past Medical History  Diagnosis Date  . Lupus   . Pulmonary embolism     Past Surgical History  Procedure Date  . Pericardial window     in 2007  due to lupus  History reviewed. No pertinent family history. Social History:   reports that she has never smoked. She does not have any smokeless tobacco history on file. She reports that she does not drink alcohol or use illicit drugs.  Allergies:  Allergies  Allergen Reactions  . Imuran (Azathioprine) Other (See Comments)    Ears burned  . Tramadol Other (See Comments)    Burned ears     (Not in a hospital admission)  Results for orders placed during the hospital encounter of 04/01/12 (from the past 48 hour(s))  CBC     Status: Abnormal   Collection Time   04/01/12  5:30 PM      Component Value Range Comment   WBC 10.4  4.0 - 10.5 K/uL    RBC 4.51  3.87 - 5.11 MIL/uL    Hemoglobin 11.3 (*) 12.0 - 15.0 g/dL    HCT 40.9  81.1 - 91.4 %    MCV 83.6  78.0 - 100.0 fL    MCH 25.1 (*) 26.0 - 34.0 pg    MCHC 30.0  30.0 - 36.0 g/dL    RDW 78.2  95.6 - 21.3 %    Platelets 259  150 - 400 K/uL   BASIC METABOLIC PANEL     Status: Abnormal   Collection Time   04/01/12  5:30 PM  Component Value Range Comment   Sodium 140  135 - 145 mEq/L    Potassium 4.2  3.5 - 5.1 mEq/L    Chloride 103  96 - 112 mEq/L    CO2 29  19 - 32 mEq/L    Glucose, Bld 118 (*) 70 - 99 mg/dL    BUN 8  6 - 23 mg/dL    Creatinine, Ser 8.65  0.50 - 1.10 mg/dL    Calcium 8.9  8.4 - 78.4 mg/dL    GFR calc non Af Amer >90  >90 mL/min    GFR calc Af Amer >90  >90 mL/min   D-DIMER, QUANTITATIVE     Status: Abnormal   Collection Time   04/01/12  5:30 PM      Component Value Range Comment   D-Dimer, Quant 0.97 (*) 0.00 - 0.48 ug/mL-FEU   PROTIME-INR     Status: Abnormal   Collection Time   04/01/12  5:30 PM      Component Value Range Comment   Prothrombin Time 21.4 (*) 11.6 - 15.2 seconds    INR 1.82 (*) 0.00 - 1.49   POCT I-STAT TROPONIN I     Status: Normal   Collection Time   04/01/12  6:13 PM      Component Value Range Comment   Troponin i, poc 0.00  0.00 - 0.08 ng/mL    Comment 3             Dg Chest 2 View  04/01/2012  *RADIOLOGY REPORT*  Clinical Data: Chest pain  CHEST - 2 VIEW   Comparison: No CT 12/24/2011 and chest x-ray 12/24/2011  Findings: Increased lung markings in the bases are unchanged and may represent scarring.  No superimposed acute infiltrate or effusion.  Cardiac enlargement without heart failure.  IMPRESSION: Chronic pulmonary scarring especially in the lung bases.  No superimposed acute abnormality.   Original Report Authenticated By: Camelia Phenes, M.D.    Ct Angio Chest Pe W/cm &/or Wo Cm  04/01/2012  *RADIOLOGY REPORT*  Clinical Data: Right chest pain and shortness of breath.  Lupus.  CT ANGIOGRAPHY CHEST  Technique:  Multidetector CT imaging of the chest using the standard protocol during bolus administration of intravenous contrast. Multiplanar reconstructed images including MIPs were obtained and reviewed to evaluate the vascular anatomy.  Contrast: OMNIPAQUE IOHEXOL 350 MG/ML SOLN  Comparison: Chest radiographs obtained earlier today.  Chest CT dated 12/24/2011.  Findings: Normally opacified pulmonary arteries with no pulmonary arterial filling defects seen.  Peripheral bullous changes are again demonstrated in both lungs with little change in adjacent interstitial prominence.  No lung masses are seen.  Prominent right hilar lymph nodes are again demonstrated.  The largest has a short axis diameter 1.8 cm on image number 65 and previously measured 1.4 cm in short axis diameter.  A mildly prominent subcarinal lymph node is again demonstrated with a short axis diameter of 1.7 cm on image number 65, with a previous short axis diameter of the 1.8 cm.  Interval mildly prominent left hilar lymph nodes, including a posterior hilar lymph node with a short axis diameter of 1.2 cm on image number 59.  Mild increase in prominence of the right axillary lymph nodes.  On image number 43, two adjacent nodes have short axis diameters of 1.2 cm of 1.1 cm, previously measuring 1.0 and 0.8 cm in corresponding dimensions. Interval increase in size of multiple mildly enlarged  left axillary lymph nodes.  These include a  node with a short axis diameter of 1.1 cm on image number 19.  This was not previously visible.  The included portion of the spleen remains normal in size.  No visualized enlarged upper abdominal lymph nodes. Mild upper thoracic spine degenerative changes.  IMPRESSION:  1.  No pulmonary emboli. 2.  Mild mediastinal, bilateral hilar and bilateral axillary adenopathy with mild progression.  This is most likely due to the patient's known lupus.  This can also be seen with sarcoidosis and lymphoproliferative disorders. 3.  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  This could also potentially be related to the patient's lupus or, potentially, sarcoidosis.   Original Report Authenticated By: Darrol Angel, M.D.     Review of Systems  Eyes: Negative for blurred vision.  Respiratory: Positive for shortness of breath.   Cardiovascular: Positive for chest pain.  Gastrointestinal: Negative for nausea, vomiting, diarrhea and constipation.  Genitourinary: Negative for dysuria.  Neurological: Negative for headaches.    Blood pressure 93/60, pulse 75, temperature 98.6 F (37 C), temperature source Oral, resp. rate 14, last menstrual period 03/16/2012, SpO2 99.00%. Physical Exam  GEN: moderate to significant distress with spasms of chest pain; accompanied by mother and husband PSYCH: pleasant, well-groomed, engaged, alert and oriented x 3 HEENT:   Head: [place]/AT, macular erythematous non-warm rash over maxillary area bilaterally and both eyelids   Eyes: PERRL, EOMI   Ears: TM clear bilaterally   Nose: no oropharyngeal exudates or tonsillar adenopathy; MMM NECK: no LAD, no thyromegaly CV: RRR, no m/r/g PULM: NI WOB; good air movement; crackles bilateral bases; no wheezes/ronchi CHEST: significant TTP over right chest (but not abdomen) and sternum ABD: NABS, soft, NT, ND SKIN: see rash on face above under HEENT;  similar rash under both arm pits and between inner thighs bilaterally EXT: swollen hands and feet but non-pitting; warm NEURO: no focal deficits; moves all extremities equally  Assessment/Plan This is a 36 year old African-American female with a history of lupus diagnosed in her early 36s who presents with acute chest pain that started the evening of 08/20 and has worsened since then. She presented to the ED this afternoon due to persistent chest pain.   We will admit her for observation under FMTS to the floor with telemetry.  # Chest pain with dyspnea but no hypoxia--concerning for manifestation of lupus. CTA negative for PE.  D/Dx: pleuritis (with questionable rub), pericarditis (with typical chest pain, however, patient does not have rubs or ST changes; no comment on potential pericardial effusion on CTA), ACS -She received a dose of Solu-medrol in the ED and repeat 8 hours later (around 0600 08/23). We will call her rheumatologist Dr. Sharmon Revere in the AM for management recommendations regarding potential lupus flare. -Hold home prednisone. Continue home Cellcept and Plaquenil.   -Will rule-out ACS with serial troponins. First set negative and ECG without T-wave or ST abnormalities. Will repeat ECG in the AM. Will risk stratify with TSH, lipid panel, HgbA1c -For analgesia: we will continue her home fentanyl patch for chronic pain with morphine IV prn. Will also give Toradol 30 mg IV now.   RHEUM # Lupus--it seems to have been fairly stable until about a month ago, when she was given a dose of steroids for myalgias and rash. 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 She had not followed-up with her PCP  regarding this medication. -Will continue Xarelto for now. Anticipate she will need indefinite anti-coagulation with her history of lupus.   CV # Borderline hypotension in the ED--SBP 90-110s -Will monitor on telemetry -Will give IVF @ maintenance and allow PO ad lib  FEN/GI -IVF: NS @ 150 -Diet: regular  PPX -DVT PPx: on Xarelto  Dispo: pending clinical improvement  CODE: FULL   Marikay Alar 04/01/2012, 9:56 PM  Priscella Mann, MD [facility], PGY-3

## 2012-04-02 ENCOUNTER — Encounter (HOSPITAL_COMMUNITY): Payer: Self-pay | Admitting: *Deleted

## 2012-04-02 LAB — CBC
HCT: 37.2 % (ref 36.0–46.0)
Hemoglobin: 11.4 g/dL — ABNORMAL LOW (ref 12.0–15.0)
MCH: 25.2 pg — ABNORMAL LOW (ref 26.0–34.0)
MCHC: 30.6 g/dL (ref 30.0–36.0)
MCV: 82.3 fL (ref 78.0–100.0)

## 2012-04-02 LAB — BASIC METABOLIC PANEL
BUN: 8 mg/dL (ref 6–23)
CO2: 27 mEq/L (ref 19–32)
Chloride: 104 mEq/L (ref 96–112)
GFR calc non Af Amer: >90 mL/min (ref >90)
Glucose, Bld: 119 mg/dL — ABNORMAL HIGH (ref 70–99)
Potassium: 4.2 mEq/L (ref 3.5–5.1)

## 2012-04-02 LAB — URINALYSIS, ROUTINE W REFLEX MICROSCOPIC
Glucose, UA: NEGATIVE mg/dL
Hgb urine dipstick: NEGATIVE
Ketones, ur: NEGATIVE mg/dL
Protein, ur: NEGATIVE mg/dL

## 2012-04-02 LAB — CARDIAC PANEL(CRET KIN+CKTOT+MB+TROPI)
Relative Index: INVALID (ref 0.0–2.5)
Relative Index: INVALID (ref 0.0–2.5)
Total CK: 53 U/L (ref 7–177)
Troponin I: <0.3 ng/mL (ref <0.30)

## 2012-04-02 LAB — LIPID PANEL
Cholesterol: 156 mg/dL (ref 0–200)
Total CHOL/HDL Ratio: 4.5 RATIO

## 2012-04-02 LAB — C-REACTIVE PROTEIN: CRP: 3 mg/dL — ABNORMAL HIGH (ref <0.60)

## 2012-04-02 MED ORDER — RIVAROXABAN 20 MG PO TABS: 20.0000 mg | ORAL_TABLET | Freq: Every day | ORAL | Status: DC

## 2012-04-02 MED ORDER — PREDNISONE 10 MG PO TABS: 40.0000 mg | ORAL_TABLET | Freq: Every day | ORAL | Status: DC

## 2012-04-02 MED ORDER — ASPIRIN 81 MG PO TBEC: 81.0000 mg | DELAYED_RELEASE_TABLET | Freq: Every day | ORAL | Status: DC

## 2012-04-02 MED ORDER — OXYCODONE HCL 10 MG PO TB12
30.0000 mg | ORAL_TABLET | Freq: Two times a day (BID) | ORAL | Status: DC
  Administered 2012-04-02: 30 mg via ORAL
  Filled 2012-04-02: qty 3

## 2012-04-02 NOTE — Progress Notes (Signed)
Patient ID: Elizabeth Baxter, female   DOB: 04-Apr-1976, 36 y.o.   MRN: [id number] Family Medicine Teaching Service Daily Progress Note Service Page: [phone]  Patient Assessment: 36 yo female presenting with chest pain, ruled out for PE and first 2 sets of cardiac enzymes negative, likely secondary to lupus flare  Subjective: Patient doing well this morning. No complaints of pain.  States that she is very sleepy.  Objective: Temp:  [97.6 F (36.4 C)-98.6 F (37 C)] 97.6 F (36.4 C) (08/23 0500) Pulse Rate:  [67-93] 75  (08/23 0500) Resp:  [12-17] 16  (08/23 0500) BP: (93-113)/(60-76) 96/65 mmHg (08/23 0500) SpO2:  [98 %-100 %] 98 % (08/23 0500) Exam: General: No acute distress, sleeping comfortably in bed Cardiovascular: regular rate and rhthym, no murmurs rubs or gallops Respiratory: clear to auscultation anteriorly, mild rales bilaterally Extremities: no edema Chest wall: non-tender to palpation  I have reviewed the patient's medications, labs, imaging, and diagnostic testing.  Notable results are summarized below.  CBC BMET   Lab 04/01/12 1730  WBC 10.4  HGB 11.3*  HCT 37.7  PLT 259    Lab 04/01/12 1730  NA 140  K 4.2  CL 103  CO2 29  BUN 8  CREATININE 0.77  GLUCOSE 118*  CALCIUM 8.9     Results for orders placed during the hospital encounter of 04/01/12 (from the past 24 hour(s))  CBC     Status: Abnormal   Collection Time   04/01/12  5:30 PM      Component Value Range   WBC 10.4  4.0 - 10.5 K/uL   RBC 4.51  3.87 - 5.11 MIL/uL   Hemoglobin 11.3 (*) 12.0 - 15.0 g/dL   HCT 14.7  82.9 - 56.2 %   MCV 83.6  78.0 - 100.0 fL   MCH 25.1 (*) 26.0 - 34.0 pg   MCHC 30.0  30.0 - 36.0 g/dL   RDW 13.0  86.5 - 78.4 %   Platelets 259  150 - 400 K/uL  BASIC METABOLIC PANEL     Status: Abnormal   Collection Time   04/01/12  5:30 PM      Component Value Range   Sodium 140  135 - 145 mEq/L   Potassium 4.2  3.5 - 5.1 mEq/L   Chloride 103  96 - 112 mEq/L   CO2 29  19 - 32  mEq/L   Glucose, Bld 118 (*) 70 - 99 mg/dL   BUN 8  6 - 23 mg/dL   Creatinine, Ser 6.96  0.50 - 1.10 mg/dL   Calcium 8.9  8.4 - 29.5 mg/dL   GFR calc non Af Amer >90  >90 mL/min   GFR calc Af Amer >90  >90 mL/min  D-DIMER, QUANTITATIVE     Status: Abnormal   Collection Time   04/01/12  5:30 PM      Component Value Range   D-Dimer, Quant 0.97 (*) 0.00 - 0.48 ug/mL-FEU  PROTIME-INR     Status: Abnormal   Collection Time   04/01/12  5:30 PM      Component Value Range   Prothrombin Time 21.4 (*) 11.6 - 15.2 seconds   INR 1.82 (*) 0.00 - 1.49  POCT I-STAT TROPONIN I     Status: Normal   Collection Time   04/01/12  6:13 PM      Component Value Range   Troponin i, poc 0.00  0.00 - 0.08 ng/mL   Comment 3  LIPID PANEL     Status: Abnormal   Collection Time   04/02/12 12:13 AM      Component Value Range   Cholesterol 156  0 - 200 mg/dL   Triglycerides 086 (*) <150 mg/dL   HDL 35 (*) >57 mg/dL   Total CHOL/HDL Ratio 4.5     VLDL 40  0 - 40 mg/dL   LDL Cholesterol 81  0 - 99 mg/dL  URINALYSIS, ROUTINE W REFLEX MICROSCOPIC     Status: Abnormal   Collection Time   04/02/12 12:17 AM      Component Value Range   Color, Urine YELLOW  YELLOW   APPearance CLEAR  CLEAR   Specific Gravity, Urine >1.046 (*) 1.005 - 1.030   pH 6.5  5.0 - 8.0   Glucose, UA NEGATIVE  NEGATIVE mg/dL   Hgb urine dipstick NEGATIVE  NEGATIVE   Bilirubin Urine NEGATIVE  NEGATIVE   Ketones, ur NEGATIVE  NEGATIVE mg/dL   Protein, ur NEGATIVE  NEGATIVE mg/dL   Urobilinogen, UA 1.0  0.0 - 1.0 mg/dL   Nitrite NEGATIVE  NEGATIVE   Leukocytes, UA NEGATIVE  NEGATIVE  CARDIAC PANEL(CRET KIN+CKTOT+MB+TROPI)     Status: Normal   Collection Time   04/02/12 12:21 AM      Component Value Range   Total CK 71  7 - 177 U/L   CK, MB 2.4  0.3 - 4.0 ng/mL   Troponin I <0.30  <0.30 ng/mL   Relative Index RELATIVE INDEX IS INVALID  0.0 - 2.5  BASIC METABOLIC PANEL     Status: Abnormal   Collection Time   04/02/12  6:35 AM        Component Value Range   Sodium 139  135 - 145 mEq/L   Potassium 4.2  3.5 - 5.1 mEq/L   Chloride 104  96 - 112 mEq/L   CO2 27  19 - 32 mEq/L   Glucose, Bld 119 (*) 70 - 99 mg/dL   BUN 8  6 - 23 mg/dL   Creatinine, Ser 8.46  0.50 - 1.10 mg/dL   Calcium 8.6  8.4 - 96.2 mg/dL   GFR calc non Af Amer >90  >90 mL/min   GFR calc Af Amer >90  >90 mL/min  CBC     Status: Abnormal   Collection Time   04/02/12  6:35 AM      Component Value Range   WBC 9.2  4.0 - 10.5 K/uL   RBC 4.52  3.87 - 5.11 MIL/uL   Hemoglobin 11.4 (*) 12.0 - 15.0 g/dL   HCT 95.2  84.1 - 32.4 %   MCV 82.3  78.0 - 100.0 fL   MCH 25.2 (*) 26.0 - 34.0 pg   MCHC 30.6  30.0 - 36.0 g/dL   RDW 40.1  02.7 - 25.3 %   Platelets 299  150 - 400 K/uL     Imaging/Diagnostic Tests: CTA: 1. No pulmonary emboli.  2. Mild mediastinal, bilateral hilar and bilateral axillary  adenopathy with mild progression. This is most likely due to the  patient's known lupus. This can also be seen with sarcoidosis and  lymphoproliferative disorders.  3. No significant change in chronic interstitial lung disease with  bullous changes bilaterally. This could also potentially be  related to the patient's lupus or, potentially, sarcoidosis.  CXR: Chronic pulmonary scarring especially in the lung bases. No  superimposed acute abnormality.   Plan: This is a 36 year old African-American female with a history of  lupus diagnosed in her early 38s who presents with acute chest pain that started the evening of 08/20 and has worsened since then. She presented to the ED this afternoon due to persistent chest pain.   We will admit her for observation under FMTS to the floor with telemetry.   # Chest pain with dyspnea but no hypoxia--concerning for manifestation of lupus, such as pleuritis with bilateral basilar crackles versus rubs. CTA negative for PE.  D/Dx: pleuritis (with questionable rub), pericarditis (with typical chest pain, however, patient  does not have rubs or ST changes; no comment on potential pericardial effusion on CTA), ACS  -She received a dose of Solu-medrol in the ED and repeat 8 hours later (around 0600 08/23). Call placed to her rheumatologist Dr. Sharmon Revere in the AM for management recommendations regarding potential lupus flare, awaiting return of call. - solumedrol 125 mg given in ED last night and 1x this morning -Hold home prednisone. Continue home Cellcept and Plaquenil.  -Will rule-out ACS with serial troponins. First set negative and ECG without T-wave or ST abnormalities.  Second set of cardiac enzymes negative. Will repeat ECG in the AM and third set of cardiac enzymes. Will risk stratify with TSH, lipid panel, HgbA1c  -For analgesia: we will continue her home fentanyl patch for chronic pain with morphine IV prn as well as home oxycodone 30 mg q12 hr. Required no pain medications over night. - lipid panel revealed elevated triglycerides, normal LDL, low HDL  RHEUM  # Lupus--it seems to have been fairly stable until about a month ago, when she was given a dose of steroids for myalgias and rash. 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 She had not followed-up with her PCP regarding this medication.  -Will continue Xarelto for now. Anticipate she will need indefinite anti-coagulation with her history of lupus.   CV  # Borderline hypotension in the ED--SBP 90-110s  -Will monitor on telemetry  -Will give IVF @ maintenance and allow PO ad lib   FEN/GI  -IVF: NS @ 150  -Diet: regular   PPX  -DVT PPx: on Xarelto   Dispo: likely home this afternoon pending recs from rheumatologist  CODE:  Georga Kaufmann, MD 04/02/2012, 7:15 AM

## 2012-04-02 NOTE — Progress Notes (Signed)
UR Completed Deatrice Spanbauer Graves-Bigelow, RN,BSN [phone]  

## 2012-04-02 NOTE — H&P (Signed)
[facility] Attending Admit Note  Patient seen and examined by me, discussed with resident team and I agree with their assess/plan as per documentation. Briefly, a 36yoF with SLE and most recent flare 1 month ago, presenting with central/right-sided chest pain with abrupt onset on Tues, Aug 20th and constant since then.  She has h/o PE diagnosed by CTA on 12/24/2011 and admits to inconsistently taking the Xarelto she was prescribed at that time.  She denies prior history of VTE event.   This morning she reports mild improvement in her chest pain; worse when she inhales deeply, coughs, or moves.  Denies cough or true dyspnea, but does not breathe deeply due to pain.  Denies leg swelling or tenderness, denies fevers/chills.   Labs, imaging and medication lists reviewed. By my exam, she is alert and not in acute distress. She does have bilat rales heard on lung exam; no cardiac friction rubs noted when sitting forward; this does not seem to accentuate her pain.   Assess/Plan: 36yoF with SLE and h/o PE May 2013, now presenting with central chest wall pain.  She adds on ROS that she does have generalized pain that is consistent with prior SLE flares, the most recent of which cleared with steroid injection in rheumatologist's office.  Her ECG does not suggest pericarditis; she is s/p pericardial window in 2007 as well.  She has received steroid therapy during this hospitalization (Solumedrol 125mg ) and is prescribed prednisone 20mg  daily as outpatient regimen; team to contact her rheumatologist this morning for further recs. She admits to being inconsistent with her medication admin at home. Paula Compton, MD

## 2012-04-02 NOTE — Discharge Summary (Signed)
Physician Discharge Summary  Patient ID: Elizabeth Baxter MRN: [id number] DOB: 1976-04-21 Age: 36 y.o.  Admit date: 04/01/2012 Discharge date: 04/02/2012 Admitting Physician: Barbaraann Barthel, MD  PCP: Francee Gentile, MD  Consultants:  Rheumatology: Francee Gentile   Discharge Diagnosis: Lupus exacerbation Active Problems:  * No active hospital problems. [facility] Course Patient presented to ED with chest pain and was admitted for chest pain rule-out.  Had negative serial cardiac enzymes and negative EKG in ED.  Positive D-dimer in the ED and subsequent negative CTA for PE.  With negative cardiac and PE work-up and chest wall tenderness, assumed cause of chest pain was lupus flare.  Patient was given 2 doses of solumedrol 125 mg, one in the ED and one on the morning of 8/23.  She also was started on morphine 4 mg prn for pain and received 2 doses of this in the ED.  We continued her home fentanyl patch.  On the morning of 8/23 we restarted her home oxycodone.  She was continued on home cellcept and plaquenil.  Her home prednisone was held.  Patient has history of PE and per patient was intermittently on xarelto 10 mg daily over the past several months.  Upon discharge she was started on 20 mg daily xarelto. Patient had borderline hypotension in the ED--SBP 90-110s and was monitored on telemetry and given maintenance IV fluids. Patient to be discharged on prednisone 40 mg x 10 days, then to taper down to 20 mg.  Problem List 1. Lupus 2. History of PE   Discharge PE General: No acute distress, sleeping comfortably in bed  Cardiovascular: regular rate and rhthym, no murmurs rubs or gallops  Respiratory: clear to auscultation anteriorly, mild rales bilaterally  Extremities: no edema  Chest wall: non-tender to palpation   Filed Vitals:   04/02/12 0500  BP: 96/65  Pulse: 75  Temp: 97.6 F (36.4 C)  Resp: 16    Procedures/Imaging:  Dg Chest 2 View  04/01/2012  *RADIOLOGY  REPORT*  Clinical Data: Chest pain  CHEST - 2 VIEW  Comparison: No CT 12/24/2011 and chest x-ray 12/24/2011  Findings: Increased lung markings in the bases are unchanged and may represent scarring.  No superimposed acute infiltrate or effusion.  Cardiac enlargement without heart failure.  IMPRESSION: Chronic pulmonary scarring especially in the lung bases.  No superimposed acute abnormality.   Original Report Authenticated By: Camelia Phenes, M.D.    Ct Angio Chest Pe W/cm &/or Wo Cm  04/01/2012  *RADIOLOGY REPORT*  Clinical Data: Right chest pain and shortness of breath.  Lupus.  CT ANGIOGRAPHY CHEST  Technique:  Multidetector CT imaging of the chest using the standard protocol during bolus administration of intravenous contrast. Multiplanar reconstructed images including MIPs were obtained and reviewed to evaluate the vascular anatomy.  Contrast: OMNIPAQUE IOHEXOL 350 MG/ML SOLN  Comparison: Chest radiographs obtained earlier today.  Chest CT dated 12/24/2011.  Findings: Normally opacified pulmonary arteries with no pulmonary arterial filling defects seen.  Peripheral bullous changes are again demonstrated in both lungs with little change in adjacent interstitial prominence.  No lung masses are seen.  Prominent right hilar lymph nodes are again demonstrated.  The largest has a short axis diameter 1.8 cm on image number 65 and previously measured 1.4 cm in short axis diameter.  A mildly prominent subcarinal lymph node is again demonstrated with a short axis diameter of 1.7 cm on image number 65, with a previous short axis diameter of the  1.8 cm.  Interval mildly prominent left hilar lymph nodes, including a posterior hilar lymph node with a short axis diameter of 1.2 cm on image number 59.  Mild increase in prominence of the right axillary lymph nodes.  On image number 43, two adjacent nodes have short axis diameters of 1.2 cm of 1.1 cm, previously measuring 1.0 and 0.8 cm in corresponding dimensions.  Interval increase in size of multiple mildly enlarged left axillary lymph nodes.  These include a node with a short axis diameter of 1.1 cm on image number 19.  This was not previously visible.  The included portion of the spleen remains normal in size.  No visualized enlarged upper abdominal lymph nodes. Mild upper thoracic spine degenerative changes.  IMPRESSION:  1.  No pulmonary emboli. 2.  Mild mediastinal, bilateral hilar and bilateral axillary adenopathy with mild progression.  This is most likely due to the patient's known lupus.  This can also be seen with sarcoidosis and lymphoproliferative disorders. 3.  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  This could also potentially be related to the patient's lupus or, potentially, sarcoidosis.   Original Report Authenticated By: Darrol Angel, M.D.     Labs  CBC  Lab 04/02/12 0635 04/01/12 1730  WBC 9.2 10.4  HGB 11.4* 11.3*  HCT 37.2 37.7  PLT 299 259   BMET  Lab 04/02/12 0635 04/01/12 1730  NA 139 140  K 4.2 4.2  CL 104 103  CO2 27 29  BUN 8 8  CREATININE 0.59 0.77  CALCIUM 8.6 8.9  PROT -- --  BILITOT -- --  ALKPHOS -- --  ALT -- --  AST -- --  GLUCOSE 119* 118*   Results for orders placed during the hospital encounter of 04/01/12 (from the past 72 hour(s))  CBC     Status: Abnormal   Collection Time   04/01/12  5:30 PM      Component Value Range Comment   WBC 10.4  4.0 - 10.5 K/uL    RBC 4.51  3.87 - 5.11 MIL/uL    Hemoglobin 11.3 (*) 12.0 - 15.0 g/dL    HCT 16.1  09.6 - 04.5 %    MCV 83.6  78.0 - 100.0 fL    MCH 25.1 (*) 26.0 - 34.0 pg    MCHC 30.0  30.0 - 36.0 g/dL    RDW 40.9  81.1 - 91.4 %    Platelets 259  150 - 400 K/uL   BASIC METABOLIC PANEL     Status: Abnormal   Collection Time   04/01/12  5:30 PM      Component Value Range Comment   Sodium 140  135 - 145 mEq/L    Potassium 4.2  3.5 - 5.1 mEq/L    Chloride 103  96 - 112 mEq/L    CO2 29  19 - 32 mEq/L     Glucose, Bld 118 (*) 70 - 99 mg/dL    BUN 8  6 - 23 mg/dL    Creatinine, Ser 7.82  0.50 - 1.10 mg/dL    Calcium 8.9  8.4 - 95.6 mg/dL    GFR calc non Af Amer >90  >90 mL/min    GFR calc Af Amer >90  >90 mL/min   D-DIMER, QUANTITATIVE     Status: Abnormal   Collection Time   04/01/12  5:30 PM      Component Value Range Comment   D-Dimer, Quant 0.97 (*)  0.00 - 0.48 ug/mL-FEU   PROTIME-INR     Status: Abnormal   Collection Time   04/01/12  5:30 PM      Component Value Range Comment   Prothrombin Time 21.4 (*) 11.6 - 15.2 seconds    INR 1.82 (*) 0.00 - 1.49   POCT I-STAT TROPONIN I     Status: Normal   Collection Time   04/01/12  6:13 PM      Component Value Range Comment   Troponin i, poc 0.00  0.00 - 0.08 ng/mL    Comment 3            TSH     Status: Abnormal   Collection Time   04/02/12 12:12 AM      Component Value Range Comment   TSH 0.170 (*) 0.350 - 4.500 uIU/mL   HEMOGLOBIN A1C     Status: Abnormal   Collection Time   04/02/12 12:12 AM      Component Value Range Comment   Hemoglobin A1C 5.9 (*) <5.7 %    Mean Plasma Glucose 123 (*) <117 mg/dL   C-REACTIVE PROTEIN     Status: Abnormal   Collection Time   04/02/12 12:12 AM      Component Value Range Comment   CRP 3.0 (*) <0.60 mg/dL   LIPID PANEL     Status: Abnormal   Collection Time   04/02/12 12:13 AM      Component Value Range Comment   Cholesterol 156  0 - 200 mg/dL    Triglycerides 454 (*) <150 mg/dL    HDL 35 (*) >09 mg/dL    Total CHOL/HDL Ratio 4.5      VLDL 40  0 - 40 mg/dL    LDL Cholesterol 81  0 - 99 mg/dL   URINALYSIS, ROUTINE W REFLEX MICROSCOPIC     Status: Abnormal   Collection Time   04/02/12 12:17 AM      Component Value Range Comment   Color, Urine YELLOW  YELLOW    APPearance CLEAR  CLEAR    Specific Gravity, Urine >1.046 (*) 1.005 - 1.030    pH 6.5  5.0 - 8.0    Glucose, UA NEGATIVE  NEGATIVE mg/dL    Hgb urine dipstick NEGATIVE  NEGATIVE    Bilirubin Urine NEGATIVE  NEGATIVE    Ketones,  ur NEGATIVE  NEGATIVE mg/dL    Protein, ur NEGATIVE  NEGATIVE mg/dL    Urobilinogen, UA 1.0  0.0 - 1.0 mg/dL    Nitrite NEGATIVE  NEGATIVE    Leukocytes, UA NEGATIVE  NEGATIVE MICROSCOPIC NOT DONE ON URINES WITH NEGATIVE PROTEIN, BLOOD, LEUKOCYTES, NITRITE, OR GLUCOSE <1000 mg/dL.  CARDIAC PANEL(CRET KIN+CKTOT+MB+TROPI)     Status: Normal   Collection Time   04/02/12 12:21 AM      Component Value Range Comment   Total CK 71  7 - 177 U/L    CK, MB 2.4  0.3 - 4.0 ng/mL    Troponin I <0.30  <0.30 ng/mL    Relative Index RELATIVE INDEX IS INVALID  0.0 - 2.5   BASIC METABOLIC PANEL     Status: Abnormal   Collection Time   04/02/12  6:35 AM      Component Value Range Comment   Sodium 139  135 - 145 mEq/L    Potassium 4.2  3.5 - 5.1 mEq/L    Chloride 104  96 - 112 mEq/L    CO2 27  19 - 32 mEq/L  Glucose, Bld 119 (*) 70 - 99 mg/dL    BUN 8  6 - 23 mg/dL    Creatinine, Ser 1.47  0.50 - 1.10 mg/dL    Calcium 8.6  8.4 - 82.9 mg/dL    GFR calc non Af Amer >90  >90 mL/min    GFR calc Af Amer >90  >90 mL/min   CBC     Status: Abnormal   Collection Time   04/02/12  6:35 AM      Component Value Range Comment   WBC 9.2  4.0 - 10.5 K/uL    RBC 4.52  3.87 - 5.11 MIL/uL    Hemoglobin 11.4 (*) 12.0 - 15.0 g/dL    HCT 56.2  13.0 - 86.5 %    MCV 82.3  78.0 - 100.0 fL    MCH 25.2 (*) 26.0 - 34.0 pg    MCHC 30.6  30.0 - 36.0 g/dL    RDW 78.4  69.6 - 29.5 %    Platelets 299  150 - 400 K/uL   CARDIAC PANEL(CRET KIN+CKTOT+MB+TROPI)     Status: Normal   Collection Time   04/02/12  9:02 AM      Component Value Range Comment   Total CK 53  7 - 177 U/L    CK, MB 2.1  0.3 - 4.0 ng/mL    Troponin I <0.30  <0.30 ng/mL    Relative Index RELATIVE INDEX IS INVALID  0.0 - 2.5    Patient condition at time of discharge/disposition: stable  Disposition-home   Follow up issues: 1. F/u labs C3, C4, anti-dsDNA 2. F/u low TSH 3. F/u elevated triglycerides 4. Will need close follow-up of xarelto  Discharge  follow up:   Patient to schedule follow-up appointment with her rheumatologist within 10 days  Discharge Instructions: Please refer to Patient Instructions section of EMR for full details.  Patient was counseled important signs and symptoms that should prompt return to medical care, changes in medications, dietary instructions, activity restrictions, and follow up appointments.  Significant instructions noted below:   Discharge Medications Medication List  As of 04/02/2012 10:28 PM   START taking these medications         aspirin 81 MG EC tablet   Take 1 tablet (81 mg total) by mouth daily.         CHANGE how you take these medications         predniSONE 10 MG tablet   Commonly known as: DELTASONE   Take 4 tablets (40 mg total) by mouth daily. 40mg  daily for the next 10 days then back to your normal dose or as directed by your doctor   What changed: - dose - doctor's instructions      Rivaroxaban 20 MG Tabs   Commonly known as: XARELTO   Take 1 tablet (20 mg total) by mouth daily.   What changed: - medication strength - dose         CONTINUE taking these medications         albuterol 108 (90 BASE) MCG/ACT inhaler   Commonly known as: PROVENTIL HFA;VENTOLIN HFA      CELLCEPT 500 MG tablet   Generic drug: mycophenolate      esomeprazole 20 MG capsule   Commonly known as: NEXIUM      fentaNYL 75 MCG/HR   Commonly known as: DURAGESIC - dosed mcg/hr      hydroxychloroquine 200 MG tablet   Commonly known as: PLAQUENIL  OXYCODONE HCL PO          Where to get your medications    These are the prescriptions that you need to pick up. We sent them to a specific pharmacy, so you will need to go there to get them.   [id number] [place] - [address]    [address] [place] [address]    Phone: [phone]        predniSONE 10 MG tablet   Rivaroxaban 20 MG Tabs         You may get these medications from any  pharmacy.         aspirin 81 MG EC tablet           Marikay Alar, MD of [facility] 04/02/2012 9:41 AM

## 2012-04-02 NOTE — Care Management Note (Signed)
    Page 1 of 1   04/02/2012     1:35:27 PM   CARE MANAGEMENT NOTE 04/02/2012  Patient:  Elizabeth Baxter,Elizabeth Baxter   Account Number:  [id number]  Date Initiated:  04/02/2012  Documentation initiated by:  GRAVES-BIGELOW,Francene Mcerlean  Subjective/Objective Assessment:   Pt admitted with cp. Question Lupus Flare. Pt is from home with spouse.     Action/Plan:   Noneeds from CM at this time.   Anticipated DC Date:  04/03/2012   Anticipated DC Plan:  HOME/SELF CARE      DC Planning Services  CM consult      Choice offered to / List presented to:             Status of service:  Completed, signed off Medicare Important Message given?   (If response is "NO", the following Medicare IM given date fields will be blank) Date Medicare IM given:   Date Additional Medicare IM given:    Discharge Disposition:  HOME/SELF CARE  Per UR Regulation:  Reviewed for med. necessity/level of care/duration of stay  If discussed at Long Length of Stay Meetings, dates discussed:    Comments:

## 2012-04-02 NOTE — Progress Notes (Signed)
Discussed in rounds.  Seen earlier by attending Dr. Mauricio Po.  Agree with management.  Most likely a lupus flair.  Will contact her rheumatologist.

## 2012-04-03 LAB — C4 COMPLEMENT: Complement C4, Body Fluid: 10 mg/dL — ABNORMAL LOW (ref 10–40)

## 2012-04-03 NOTE — Discharge Summary (Signed)
Discussed in roungs and seen earlier on the DC day by attending, Dr. Mauricio Po.  Agree with Dr. Purvis Sheffield documentation and management.

## 2012-04-06 LAB — ANTI-DNA ANTIBODY, DOUBLE-STRANDED: ds DNA Ab: 11 IU/mL (ref <30)

## 2012-07-25 ENCOUNTER — Inpatient Hospital Stay (HOSPITAL_COMMUNITY)
Admission: EM | Admit: 2012-07-25 | Discharge: 2012-07-28 | DRG: 392 | Disposition: A | Discharge status: Home / Self Care | Attending: Internal Medicine | Admitting: Internal Medicine

## 2012-07-25 ENCOUNTER — Encounter (HOSPITAL_COMMUNITY): Payer: Self-pay | Admitting: Internal Medicine

## 2012-07-25 ENCOUNTER — Observation Stay (HOSPITAL_COMMUNITY)

## 2012-07-25 ENCOUNTER — Emergency Department (HOSPITAL_COMMUNITY)

## 2012-07-25 DIAGNOSIS — D638 Anemia in other chronic diseases classified elsewhere: Secondary | ICD-10-CM | POA: Diagnosis present

## 2012-07-25 DIAGNOSIS — J189 Pneumonia, unspecified organism: Secondary | ICD-10-CM

## 2012-07-25 DIAGNOSIS — IMO0002 Reserved for concepts with insufficient information to code with codable children: Secondary | ICD-10-CM

## 2012-07-25 DIAGNOSIS — I959 Hypotension, unspecified: Secondary | ICD-10-CM | POA: Diagnosis present

## 2012-07-25 DIAGNOSIS — G8929 Other chronic pain: Secondary | ICD-10-CM | POA: Diagnosis present

## 2012-07-25 DIAGNOSIS — Z86711 Personal history of pulmonary embolism: Secondary | ICD-10-CM

## 2012-07-25 DIAGNOSIS — M329 Systemic lupus erythematosus, unspecified: Secondary | ICD-10-CM | POA: Diagnosis present

## 2012-07-25 DIAGNOSIS — A088 Other specified intestinal infections: Principal | ICD-10-CM | POA: Diagnosis present

## 2012-07-25 DIAGNOSIS — K219 Gastro-esophageal reflux disease without esophagitis: Secondary | ICD-10-CM | POA: Diagnosis present

## 2012-07-25 DIAGNOSIS — R112 Nausea with vomiting, unspecified: Secondary | ICD-10-CM

## 2012-07-25 DIAGNOSIS — E860 Dehydration: Secondary | ICD-10-CM

## 2012-07-25 DIAGNOSIS — Z79899 Other long term (current) drug therapy: Secondary | ICD-10-CM

## 2012-07-25 HISTORY — DX: Other chronic pain: G89.29

## 2012-07-25 LAB — BASIC METABOLIC PANEL
Calcium: 8.8 mg/dL (ref 8.4–10.5)
Creatinine, Ser: 0.94 mg/dL (ref 0.50–1.10)
GFR calc non Af Amer: 78 mL/min — ABNORMAL LOW (ref >90)
Sodium: 138 mEq/L (ref 135–145)

## 2012-07-25 LAB — HEPATIC FUNCTION PANEL
Albumin: 2.9 g/dL — ABNORMAL LOW (ref 3.5–5.2)
Indirect Bilirubin: 0.2 mg/dL — ABNORMAL LOW (ref 0.3–0.9)
Total Bilirubin: 0.3 mg/dL (ref 0.3–1.2)
Total Protein: 8 g/dL (ref 6.0–8.3)

## 2012-07-25 LAB — CBC WITH DIFFERENTIAL/PLATELET
Basophils Absolute: 0 10*3/uL (ref 0.0–0.1)
Eosinophils Absolute: 0 10*3/uL (ref 0.0–0.7)
Eosinophils Relative: 0 % (ref 0–5)
MCH: 24.9 pg — ABNORMAL LOW (ref 26.0–34.0)
MCHC: 30.8 g/dL (ref 30.0–36.0)
MCV: 81 fL (ref 78.0–100.0)
Monocytes Absolute: 0.2 10*3/uL (ref 0.1–1.0)
Platelets: 281 10*3/uL (ref 150–400)
RDW: 14.7 % (ref 11.5–15.5)

## 2012-07-25 LAB — URINE MICROSCOPIC-ADD ON

## 2012-07-25 LAB — URINALYSIS, ROUTINE W REFLEX MICROSCOPIC
Glucose, UA: NEGATIVE mg/dL
Ketones, ur: NEGATIVE mg/dL
Leukocytes, UA: NEGATIVE
Nitrite: NEGATIVE
Protein, ur: 30 mg/dL — AB
pH: 5.5 (ref 5.0–8.0)

## 2012-07-25 LAB — MRSA PCR SCREENING: MRSA by PCR: NEGATIVE

## 2012-07-25 LAB — PREGNANCY, URINE: Preg Test, Ur: NEGATIVE

## 2012-07-25 MED ORDER — ACETAMINOPHEN 325 MG PO TABS
650.0000 mg | ORAL_TABLET | Freq: Four times a day (QID) | ORAL | Status: DC | PRN
  Administered 2012-07-27: 650 mg via ORAL
  Filled 2012-07-25: qty 2

## 2012-07-25 MED ORDER — ACETAMINOPHEN 10 MG/ML IV SOLN
1000.0000 mg | Freq: Once | INTRAVENOUS | Status: AC
  Administered 2012-07-25: 1000 mg via INTRAVENOUS
  Filled 2012-07-25: qty 100

## 2012-07-25 MED ORDER — LEVOFLOXACIN IN D5W 750 MG/150ML IV SOLN
750.0000 mg | Freq: Once | INTRAVENOUS | Status: AC
  Administered 2012-07-25: 750 mg via INTRAVENOUS
  Filled 2012-07-25: qty 150

## 2012-07-25 MED ORDER — ONDANSETRON HCL 4 MG/2ML IJ SOLN
4.0000 mg | Freq: Once | INTRAMUSCULAR | Status: AC
  Administered 2012-07-25: 4 mg via INTRAVENOUS
  Filled 2012-07-25: qty 2

## 2012-07-25 MED ORDER — MYCOPHENOLATE MOFETIL 250 MG PO CAPS
1500.0000 mg | ORAL_CAPSULE | Freq: Two times a day (BID) | ORAL | Status: DC
  Administered 2012-07-25 – 2012-07-28 (×7): 1500 mg via ORAL
  Filled 2012-07-25 (×9): qty 6

## 2012-07-25 MED ORDER — HYDROXYCHLOROQUINE SULFATE 200 MG PO TABS
200.0000 mg | ORAL_TABLET | Freq: Two times a day (BID) | ORAL | Status: DC
  Administered 2012-07-25 – 2012-07-28 (×7): 200 mg via ORAL
  Filled 2012-07-25 (×9): qty 1

## 2012-07-25 MED ORDER — SODIUM CHLORIDE 0.9 % IV BOLUS (SEPSIS)
1000.0000 mL | Freq: Once | INTRAVENOUS | Status: AC
  Administered 2012-07-25: 1000 mL via INTRAVENOUS

## 2012-07-25 MED ORDER — HYDROMORPHONE HCL PF 1 MG/ML IJ SOLN
1.0000 mg | INTRAMUSCULAR | Status: DC | PRN
  Administered 2012-07-26 – 2012-07-28 (×6): 1 mg via INTRAVENOUS
  Filled 2012-07-25 (×6): qty 1

## 2012-07-25 MED ORDER — FENTANYL 50 MCG/HR TD PT72
75.0000 ug | MEDICATED_PATCH | TRANSDERMAL | Status: DC
  Administered 2012-07-25 – 2012-07-28 (×2): 75 ug via TRANSDERMAL
  Filled 2012-07-25 (×3): qty 1

## 2012-07-25 MED ORDER — ALBUTEROL SULFATE (5 MG/ML) 0.5% IN NEBU: 2.5000 mg | INHALATION_SOLUTION | RESPIRATORY_TRACT | Status: DC | PRN

## 2012-07-25 MED ORDER — KETOROLAC TROMETHAMINE 30 MG/ML IJ SOLN
30.0000 mg | Freq: Once | INTRAMUSCULAR | Status: AC
  Administered 2012-07-25: 30 mg via INTRAVENOUS
  Filled 2012-07-25: qty 1

## 2012-07-25 MED ORDER — ENOXAPARIN SODIUM 40 MG/0.4ML ~~LOC~~ SOLN
40.0000 mg | SUBCUTANEOUS | Status: DC
  Administered 2012-07-26 – 2012-07-27 (×2): 40 mg via SUBCUTANEOUS
  Filled 2012-07-25 (×3): qty 0.4

## 2012-07-25 MED ORDER — ONDANSETRON HCL 4 MG PO TABS: 4.0000 mg | ORAL_TABLET | Freq: Four times a day (QID) | ORAL | Status: DC | PRN

## 2012-07-25 MED ORDER — ENOXAPARIN SODIUM 100 MG/ML ~~LOC~~ SOLN
90.0000 mg | Freq: Once | SUBCUTANEOUS | Status: AC
  Administered 2012-07-25: 100 mg via SUBCUTANEOUS
  Filled 2012-07-25: qty 1

## 2012-07-25 MED ORDER — MYCOPHENOLATE MOFETIL 500 MG PO TABS: 1500.0000 mg | ORAL_TABLET | Freq: Two times a day (BID) | ORAL | Status: DC

## 2012-07-25 MED ORDER — SODIUM CHLORIDE 0.9 % IV SOLN: INTRAVENOUS | Status: DC

## 2012-07-25 MED ORDER — ALUM & MAG HYDROXIDE-SIMETH 200-200-20 MG/5ML PO SUSP: 30.0000 mL | Freq: Four times a day (QID) | ORAL | Status: DC | PRN

## 2012-07-25 MED ORDER — OXYCODONE HCL 5 MG PO TABS: 30.0000 mg | ORAL_TABLET | Freq: Three times a day (TID) | ORAL | Status: DC

## 2012-07-25 MED ORDER — HYDROCORTISONE SOD SUCCINATE 100 MG IJ SOLR
100.0000 mg | Freq: Once | INTRAMUSCULAR | Status: AC
  Administered 2012-07-25: 100 mg via INTRAVENOUS
  Filled 2012-07-25: qty 2

## 2012-07-25 MED ORDER — ACETAMINOPHEN 650 MG RE SUPP: 650.0000 mg | Freq: Four times a day (QID) | RECTAL | Status: DC | PRN

## 2012-07-25 MED ORDER — HYDROCORTISONE SOD SUCCINATE 100 MG IJ SOLR
50.0000 mg | Freq: Three times a day (TID) | INTRAMUSCULAR | Status: DC
  Administered 2012-07-25 – 2012-07-27 (×6): 50 mg via INTRAVENOUS
  Filled 2012-07-25 (×10): qty 1

## 2012-07-25 MED ORDER — ONDANSETRON HCL 4 MG/2ML IJ SOLN: 4.0000 mg | Freq: Four times a day (QID) | INTRAMUSCULAR | Status: DC | PRN

## 2012-07-25 MED ORDER — SODIUM CHLORIDE 0.9 % IJ SOLN
3.0000 mL | Freq: Two times a day (BID) | INTRAMUSCULAR | Status: DC
  Administered 2012-07-25 – 2012-07-26 (×3): 3 mL via INTRAVENOUS

## 2012-07-25 MED ORDER — IOHEXOL 350 MG/ML SOLN
80.0000 mL | Freq: Once | INTRAVENOUS | Status: AC | PRN
Start: 2012-07-25 — End: 2012-07-25
  Administered 2012-07-25: 80 mL via INTRAVENOUS

## 2012-07-25 MED ORDER — PANTOPRAZOLE SODIUM 40 MG IV SOLR
40.0000 mg | Freq: Two times a day (BID) | INTRAVENOUS | Status: DC
  Administered 2012-07-25 – 2012-07-27 (×5): 40 mg via INTRAVENOUS
  Filled 2012-07-25 (×7): qty 40

## 2012-07-25 MED ORDER — POTASSIUM CHLORIDE IN NACL 20-0.9 MEQ/L-% IV SOLN
INTRAVENOUS | Status: DC
  Administered 2012-07-25: 150 mL/h via INTRAVENOUS
  Administered 2012-07-25 – 2012-07-27 (×3): via INTRAVENOUS
  Filled 2012-07-25 (×11): qty 1000

## 2012-07-25 MED ORDER — OXYCODONE HCL 5 MG PO TABS
30.0000 mg | ORAL_TABLET | Freq: Three times a day (TID) | ORAL | Status: DC
  Administered 2012-07-25 – 2012-07-28 (×10): 30 mg via ORAL
  Filled 2012-07-25 (×8): qty 6
  Filled 2012-07-25: qty 1
  Filled 2012-07-25: qty 6
  Filled 2012-07-25: qty 1
  Filled 2012-07-25: qty 5

## 2012-07-25 MED ORDER — ONDANSETRON HCL 4 MG/2ML IJ SOLN: 4.0000 mg | Freq: Three times a day (TID) | INTRAMUSCULAR | Status: DC | PRN

## 2012-07-25 NOTE — ED Notes (Signed)
Return from CT

## 2012-07-25 NOTE — H&P (Signed)
[facility] History and Physical  Elizabeth Baxter:[id number] DOB: Apr 17, 1976 DOA: 07/25/2012  Referring physician: Dr. Eber Hong PCP: Gwynneth Aliment, MD  Specialists: None  Primary Rheumatologist: Francee Gentile, MD Pain MD:  Dr Albertina Senegal   Chief Complaint: Nausea, vomiting and generalized aches.  HPI: Elizabeth Baxter is a 36 y.o. female with history of SLE on immunosuppressants (Plaquenil, CellCept and prednisone 20 mg daily), GERD, chronic pain, history of pulmonary embolism (completed anticoagulation with Xarelto approximately a month ago) presented to the ED on 12/15 with nausea, vomiting and generalized aches. She was in her usual state of health until 3 days ago when she started experiencing poor appetite and laid in bed all day. The next day he is a started having vomiting and has had 4 episodes thus far. Emesis consists of biliary material, medications that she has taken but no blood or coffee grounds. She denies fevers or chills. Abdominal pain only when palpated. No diarrhea. No sickly contacts or eating anything unusual. Last BM 4 days ago. Passing flatus. She was able to tolerate only ginger ale. Feels thirsty at this time. She also complained of feeling dizzy on ambulating. She has not passed out. Since the last today she is also noticed generalized aches and pains and swelling of small joints of her hands which is typical for her lupus flare. She has chronic skin rash on her upper chest, back, on and thighs which apparently have been slowly progressing over the last 2-3 months. She denies cough, chest pain or dyspnea. She denies dysuria or urinary frequency. No open wounds. In the ED she was tachypneic and hypotensive. She had low-grade fever (100.69F) normal white cell count. Chest x-ray did not show any clear infiltrate. Due to prior history of PE and tachypnea, CT angiogram of chest was done-no PE or acute findings. Patient was bolused with IV fluids, received a dose of IV  Levaquin, full dose Lovenox x1 (prior to CT), IV hydrocortisone x1 and hospitalist service were requested to admit for further evaluation and management. Patient indicates that she feels significantly better since ED arrival.   Review of Systems: All systems reviewed and apart from history of presenting illness, are negative.   Past Medical History  Diagnosis Date  . Lupus   . Pulmonary embolism   . GERD (gastroesophageal reflux disease)   . Chronic pain    Past Surgical History  Procedure Date  . Pericardial window     in 2007   Social History:  reports that she has never smoked. She does not have any smokeless tobacco history on file. She reports that she does not drink alcohol or use illicit drugs. Married. Independent of activities of daily living.  Allergies  Allergen Reactions  . Imuran (Azathioprine) Other (See Comments)    Ears burned  . Tramadol Other (See Comments)    Burned ears    No family history on file. mother has history of diabetes.  Prior to Admission medications   Medication Sig Start Date End Date Taking? Authorizing Provider  esomeprazole (NEXIUM) 20 MG capsule Take 20 mg by mouth daily before breakfast.    Yes Historical Provider, MD  fentaNYL (DURAGESIC - DOSED MCG/HR) 75 MCG/HR Place 1 patch onto the skin every 3 (three) days.    Yes Historical Provider, MD  hydroxychloroquine (PLAQUENIL) 200 MG tablet Take 200 mg by mouth 2 (two) times daily.    Yes Historical Provider, MD  mycophenolate (CELLCEPT) 500 MG tablet Take 1,500 mg by mouth 2 (  two) times daily.    Yes Historical Provider, MD  oxycodone (ROXICODONE) 30 MG immediate release tablet Take 30 mg by mouth 3 (three) times daily.   Yes Historical Provider, MD  predniSONE (DELTASONE) 20 MG tablet Take 20 mg by mouth daily.   Yes Historical Provider, MD  albuterol (PROVENTIL HFA;VENTOLIN HFA) 108 (90 BASE) MCG/ACT inhaler Inhale 2 puffs into the lungs every 4 (four) hours as needed. For shortness of  breath 06/22/11 06/21/12  Vida Roller, MD   Physical Exam: Filed Vitals:   07/25/12 0746 07/25/12 1100 07/25/12 1157 07/25/12 1200  BP: 94/62 94/52 91/54  95/58  Pulse: 99 83    Temp: 99 F (37.2 C)     TempSrc: Oral     Resp: 18 19    SpO2: 99% 99%       General exam: Moderately built and nourished female patient, lying comfortably in bed without distress.  Head, eyes and ENT: Nontraumatic and normocephalic. Pupils equally reacting to light and accommodation. Oral mucosa dry but without any other acute findings.  Neck: Supple. No JVD, carotid bruit or thyromegaly.   Lymphatics: No lymphadenopathy.  Respiratory system: Coarse-velcro-like, possibly chronic basal crackles. Rest of lung fields are clear to auscultation. No increased work of breathing.  Cardiovascular system: First and second heart sounds heard, regular rate and rhythm. No JVD, murmurs, gallops or pedal edema.  Gastrointestinal system: Abdomen is nondistended, soft. Mild suprapubic and epigastric tenderness but without rigidity, guarding or rebound. No organomegaly or masses appreciated. Normal bowel sounds heard.  Central nervous system: Alert and oriented. No focal neurological deficits.  Extremities: Symmetric 5 x 5 power. Mild diffuse swelling of bilateral fingers but no acute findings in the joints except pain full range of movements.  Skin: Extensive, Redish, macular rash predominantly in the upper anterior chest, bilateral anterior thighs, upper extremities and upper back.  Musculoskeletal system: Negative exam  Psychiatry: Pleasant and cooperative.     Labs on Admission:  Basic Metabolic Panel:  Lab 07/25/12 1610  NA 138  K 3.5  CL 102  CO2 26  GLUCOSE 112*  BUN 9  CREATININE 0.94  CALCIUM 8.8  MG --  PHOS --   Liver Function Tests:  Lab 07/25/12 0546  AST 42*  ALT 10  ALKPHOS 41  BILITOT 0.3  PROT 8.0  ALBUMIN 2.9*   No results found for this basename: LIPASE:5,AMYLASE:5 in  the last 168 hours No results found for this basename: AMMONIA:5 in the last 168 hours CBC:  Lab 07/25/12 0546  WBC 7.1  NEUTROABS 5.1  HGB 11.3*  HCT 36.7  MCV 81.0  PLT 281   Cardiac Enzymes: No results found for this basename: CKTOTAL:5,CKMB:5,CKMBINDEX:5,TROPONINI:5 in the last 168 hours  BNP (last 3 results) No results found for this basename: PROBNP:3 in the last 8760 hours CBG: No results found for this basename: GLUCAP:5 in the last 168 hours  Radiological Exams on Admission: Ct Angio Chest Pe W/cm &/or Wo Cm  07/25/2012  *RADIOLOGY REPORT*  Clinical Data: Short of breath, fever, evaluate for recurrent pulmonary emboli  CT ANGIOGRAPHY CHEST  Technique:  Multidetector CT imaging of the chest using the standard protocol during bolus administration of intravenous contrast. Multiplanar reconstructed images including MIPs were obtained and reviewed to evaluate the vascular anatomy.  Contrast: 80mL OMNIPAQUE IOHEXOL 350 MG/ML SOLN  Comparison: Most recent CT PE study 04/01/2012  Findings:  Mediastinum: Unremarkable CT appearance of the thyroid gland. Nonspecific  mediastinal and predominately right hilar  lymphadenopathy appears similar to prior. Index right hilar nodal tissue on image 39 of series 4 measures 15 mm compared to 16 mm previously. Thoracic esophagus is diffusely and mildly patulous and contains frothy debris along its course.  There may be some mild circumferential thickening distally.  Heart/Vascular: Stable cardiomegaly.  No pericardial effusion. Adequate opacification of the pulmonary artery to the segmental branches.  Evaluation of the subsegmental arterial tree limited secondary to contrast bolus and motion artifact.  No central filling defect to suggest acute pulmonary embolus.  Conventional three-vessel aortic arch.  No acute dissection or aneurysmal dilatation.  Lungs/Pleura: Similar appearance of predominantly peripheral and basilar distribution of subpleural  reticulation, honeycombing, bronchiectasis bibasilar ground-glass attenuation and architectural distortion consistent with pulmonary fibrosis. No new focal airspace consolidation or areas of ground-glass opacification to suggest active inflammation. No pleural effusion or pneumothorax. No suspicious pulmonary nodules.  Upper Abdomen:  Limited imaging of the upper abdomen is unremarkable.  Bones: No acute fracture or aggressive appearing lytic or blastic osseous lesion.  IMPRESSION:  1.  Negative for acute pulmonary embolus or other acute cardiopulmonary process.  2.  Similar appearance of peripheral and basilar predominant pulmonary fibrosis and interstitial lung disease without significant interval progression compared to 04/01/2012.  3.  Diffusely patulous esophagus containing frothy debris with mild circumferential thickening distally.  This may be related to the patient's underlying autoimmune disorder or gastroesophageal reflux disease.  4.  Stable mediastinal, right hilar and axillary adenopathy.  5.  Stable cardiomegaly.   Original Report Authenticated By: Malachy Moan, M.D.    Dg Chest Port 1 View  07/25/2012  *RADIOLOGY REPORT*  Clinical Data: Chest pain  PORTABLE CHEST - 1 VIEW  Comparison: Chest radiograph 04/01/2012, CT thorax 04/01/2012  Findings: Stable enlarged heart silhouette.  There are bilateral pleural effusions and interstitial lung disease which is increased are increased prior.  Bibasilar atelectasis similar to prior.  No pneumothorax.  IMPRESSION:  1.  Stable cardiomegaly. 2.  Increase in bilateral pleural effusions and persistent bibasilar interstitial lung disease.   Original Report Authenticated By: Genevive Bi, M.D.      Assessment/Plan Principal Problem:  *Nausea and vomiting Active Problems:  Dehydration  Hypotension  SLE exacerbation  Chronic pain  GERD (gastroesophageal reflux disease)  History of pulmonary embolism  Nausea and vomiting  Possibly acute  viral gastroenteritis.  CT findings of esophagus appreciated.  Check lipase.  Trial of clear liquids and advance diet as tolerated.  IV PPI, when necessary Zofran and IV fluids.  Dehydration  Secondary to nausea, vomiting and poor oral intake.  Aggressive IV fluid hydration.  Hypotension  Secondary to dehydration.  Patient on chronic prednisone-we'll change to IV hydrocortisone until able to tolerate by mouth consistently.  Monitor closely.  SLE exacerbation  Increased dose of IV hydrocortisone.  Continue oral CellCept and Plaquenil.  Followup with outpatient rheumatologist on discharge.  Anemia  Likely chronic disease related.  Follow CBC in a.m.  Chronic pain  Continue home medications.  Sees pain M.D. as outpatient.  History of pulmonary embolism  Completed anticoagulation.  No PE on CT at bedtime this admission.  GERD  Change PPIs to IV.   Code Status:  Full  Family Communication:  discussed with spouse at bedside.  Disposition Plan:  at least 2 midnights. Home when medically stable.   Time spent:  65 minutes  [facility] Pager [phone]  If 7PM-7AM, please contact night-coverage [email or web address] Password [email or web address] 07/25/2012, 12:01 PM

## 2012-07-25 NOTE — ED Notes (Signed)
Patient transported to CT 

## 2012-07-25 NOTE — ED Notes (Signed)
Pt aware of need for urine sample, but states unable to void at this time.

## 2012-07-25 NOTE — ED Notes (Signed)
PT states that she aches all over and that her current symptoms mimic her normal symptoms when her Lupus flares up. N/V is new. Current temp 100.8 with chills. Denies diarrhea. Pt skin is red all over. Pt states unable to keep her medications down since Friday.

## 2012-07-25 NOTE — ED Provider Notes (Signed)
History     CSN: [id number]  Arrival date & time 07/25/12  8295   First MD Initiated Contact with Patient 07/25/12 [phone]      Chief Complaint  Patient presents with  . Lupus flare up   . Emesis    (Consider location/radiation/quality/duration/timing/severity/associated sxs/prior treatment) HPI Comments: 36 year old female with a history of lupus, pulmonary embolism and acid reflux disease. She presents with a complaint of nausea and vomiting. Her symptoms started 4 days ago, they are persistent, gradually worsening and her family member notes that she has not had anything to eat or drink in 2 days. She admits to having associated fever, chills and diffuse myalgias and has been feeling short of breath with a cough. She denies dysuria or diarrhea. She does have abdominal tenderness diffusely. The patient states that she has not been able to take her medications in over 2 days because of nausea. The patient has a chronic erythematous rash which she states is not new and is associated with her lupus. She has been on prednisone chronically since approximately 5 or 6 years ago  Patient is a 36 y.o. female presenting with vomiting. The history is provided by the patient, a relative and medical records.  Emesis     Past Medical History  Diagnosis Date  . Lupus   . Pulmonary embolism   . GERD (gastroesophageal reflux disease)     Past Surgical History  Procedure Date  . Pericardial window     in 2007    No family history on file.  History  Substance Use Topics  . Smoking status: Never Smoker   . Smokeless tobacco: Not on file  . Alcohol Use: No    OB History    Grav Para Term Preterm Abortions TAB SAB Ect Mult Living                  Review of Systems  Gastrointestinal: Positive for vomiting.  All other systems reviewed and are negative.    Allergies  Imuran and Tramadol  Home Medications   Current Outpatient Rx  Name  Route  Sig  Dispense  Refill  .  ESOMEPRAZOLE MAGNESIUM 20 MG PO CPDR   Oral   Take 20 mg by mouth daily before breakfast.          . FENTANYL 75 MCG/HR TD PT72   Transdermal   Place 1 patch onto the skin every 3 (three) days.          Marland Kitchen HYDROXYCHLOROQUINE SULFATE 200 MG PO TABS   Oral   Take 200 mg by mouth 2 (two) times daily.          Marland Kitchen MYCOPHENOLATE MOFETIL 500 MG PO TABS   Oral   Take 1,500 mg by mouth 2 (two) times daily.          . OXYCODONE HCL 30 MG PO TABS   Oral   Take 30 mg by mouth 3 (three) times daily.         Marland Kitchen PREDNISONE 20 MG PO TABS   Oral   Take 20 mg by mouth daily.         . ALBUTEROL SULFATE HFA 108 (90 BASE) MCG/ACT IN AERS   Inhalation   Inhale 2 puffs into the lungs every 4 (four) hours as needed. For shortness of breath           BP 94/62  Pulse 99  Temp 99 F (37.2 C) (Oral)  Resp 18  SpO2  99%  LMP 07/04/2012  Physical Exam  Nursing note and vitals reviewed. Constitutional: She appears well-developed and well-nourished. She appears distressed.  HENT:  Head: Normocephalic and atraumatic.  Mouth/Throat: No oropharyngeal exudate.       Mucous membranes are dry  Eyes: Conjunctivae normal and EOM are normal. Pupils are equal, round, and reactive to light. Right eye exhibits no discharge. Left eye exhibits no discharge. No scleral icterus.  Neck: Normal range of motion. Neck supple. No JVD present. No thyromegaly present.  Cardiovascular: Regular rhythm, normal heart sounds and intact distal pulses.  Exam reveals no gallop and no friction rub.   No murmur heard.      Tachycardia  Pulmonary/Chest: Breath sounds normal. She is in respiratory distress. She has no wheezes. She has no rales.       Slight increased work of breathing and tachypnea, no accessory muscle use, no obvious rales or wheezing, speaks in short sentences  Abdominal: Soft. Bowel sounds are normal. She exhibits no distension and no mass. There is tenderness (mild diffuse nonfocal tenderness, no  guarding or masses, no peritoneal signs).  Musculoskeletal: Normal range of motion. She exhibits no edema and no tenderness.       No focal edema, mild tenderness in the muscles diffusely  Lymphadenopathy:    She has no cervical adenopathy.  Neurological: She is alert. Coordination normal.       Speech is clear, moves all extremities x4, follows commands without difficulty  Skin: Skin is warm and dry. No rash noted. No erythema.       Erythematous, nontender, blanching rash, no petechiae or purpura    ED Course  Procedures (including critical care time)  Labs Reviewed  CBC WITH DIFFERENTIAL - Abnormal; Notable for the following:    Hemoglobin 11.3 (*)     MCH 24.9 (*)     Monocytes Relative 2 (*)     All other components within normal limits  BASIC METABOLIC PANEL - Abnormal; Notable for the following:    Glucose, Bld 112 (*)     GFR calc non Af Amer 78 (*)     All other components within normal limits  HEPATIC FUNCTION PANEL - Abnormal; Notable for the following:    Albumin 2.9 (*)     AST 42 (*)     Indirect Bilirubin 0.2 (*)     All other components within normal limits  URINALYSIS, ROUTINE W REFLEX MICROSCOPIC - Abnormal; Notable for the following:    APPearance CLOUDY (*)     Protein, ur 30 (*)     All other components within normal limits  URINE MICROSCOPIC-ADD ON - Abnormal; Notable for the following:    Squamous Epithelial / LPF MANY (*)     Bacteria, UA MANY (*)     Casts HYALINE CASTS (*)     All other components within normal limits  PREGNANCY, URINE  URINE CULTURE  CULTURE, BLOOD (ROUTINE X 2)  CULTURE, BLOOD (ROUTINE X 2)   Dg Chest Port 1 View  07/25/2012  *RADIOLOGY REPORT*  Clinical Data: Chest pain  PORTABLE CHEST - 1 VIEW  Comparison: Chest radiograph 04/01/2012, CT thorax 04/01/2012  Findings: Stable enlarged heart silhouette.  There are bilateral pleural effusions and interstitial lung disease which is increased are increased prior.  Bibasilar  atelectasis similar to prior.  No pneumothorax.  IMPRESSION:  1.  Stable cardiomegaly. 2.  Increase in bilateral pleural effusions and persistent bibasilar interstitial lung disease.   Original Report Authenticated  By: Genevive Bi, M.D.      1. CAP (community acquired pneumonia)   2. Nausea and vomiting   3. Dehydration       MDM  The patient definitely has signs and symptoms consistent with infection, we'll get a chest x-ray to rule out pneumonia, will check laboratory tests, urinalysis, IV fluid resuscitation and antipyretic therapy.  The patient has been significantly nauseated and vomiting and is unable to tolerate oral fluids or tablets. She has been given IV fluids, Solu-Cortef secondary to her chronic steroid use and acute distress, Tylenol and Toradol which has helped with her fever and her body aches. Her temperature has defervesced, her pulse is about the same at about 100, her blood pressure is slightly low at 94/62.  Lab work shows normal renal function, slight anemia, no leukocytosis and a urinalysis which is slightly dehydrated with mild proteinuria. There is no overt signs of infection in her urine. Her chest x-ray going to my interpretation shows a possible left lower lobe infiltrate. Radiology has read the x-ray as bibasilar interstitial lung disease and bilateral pleural effusions. At this time due to the patient's fever, tachypnea and tachycardia with mild hypotension she will be admitted to the hospital for IV antibiotics.  D/w Dr. Waymon Amato, will admit - CT chest pending to eval for PE as source of SOB  Vida Roller, MD 07/25/12 [phone]

## 2012-07-26 DIAGNOSIS — G8929 Other chronic pain: Secondary | ICD-10-CM

## 2012-07-26 LAB — CBC
HCT: 33.7 % — ABNORMAL LOW (ref 36.0–46.0)
Hemoglobin: 10.2 g/dL — ABNORMAL LOW (ref 12.0–15.0)
MCH: 24.5 pg — ABNORMAL LOW (ref 26.0–34.0)
RBC: 4.17 MIL/uL (ref 3.87–5.11)

## 2012-07-26 LAB — BASIC METABOLIC PANEL
BUN: 7 mg/dL (ref 6–23)
CO2: 24 mEq/L (ref 19–32)
Calcium: 7.6 mg/dL — ABNORMAL LOW (ref 8.4–10.5)
Glucose, Bld: 111 mg/dL — ABNORMAL HIGH (ref 70–99)
Potassium: 4 mEq/L (ref 3.5–5.1)
Sodium: 139 mEq/L (ref 135–145)

## 2012-07-26 LAB — URINE CULTURE

## 2012-07-26 MED ORDER — ENSURE COMPLETE PO LIQD: 237.0000 mL | Freq: Every day | ORAL | Status: DC | PRN

## 2012-07-26 NOTE — Progress Notes (Signed)
INITIAL NUTRITION ASSESSMENT  DOCUMENTATION CODES Per approved criteria  -Not Applicable    INTERVENTION: 1. Ensure Complete po daily PRN, each supplement provides 350 kcal and 13 grams of protein. 2. RD to continue to follow nutrition care plan.  NUTRITION DIAGNOSIS: Inadequate oral intake related to GI distress as evidenced by pt report and need for slow diet advancement.   Goal: Pt to meet >/= 90% of their estimated nutrition needs.  Monitor:  weight trends, lab trends, I/O's, PO intake, supplement tolerance  Reason for Assessment: Malnutrition Screening  36 y.o. female  Admitting Dx: Nausea and vomiting  ASSESSMENT: Admitted with acute viral gastroenteritis. Pt reports that her intake has improved since admission. Now on a Regular diet, has yet to receive meal tray. Tolerated clears well. She states that she usually weighs around 185 lb. Admit wt was 181 lb, however re-weight was 193, possibly 2/2 fluid balance.  Pt states that she drinks vanilla Boost at home. Agreeable to trying vanilla Ensure while here. Will order supplement prn for pt. She suspects her intake will be adequate once she has her lunch tray.  Height: Ht Readings from Last 1 Encounters:  07/25/12 5\' 7"  (1.702 m)   Weight: 181 lb per chart on admission  Ideal Body Weight: 135 lb % Ideal Body Weight: 134%  Wt Readings from Last 10 Encounters:  07/26/12 193 lb 5.5 oz (87.7 kg)  12/22/11 197 lb (89.359 kg)   Usual Body Weight:  185 lb - per pt % Usual Body Weight: 98%  BMI:  28.4 (using admit wt) - overweight  Estimated Nutritional Needs: Kcal: 1600 - 1800 kcal Protein: 80 - 90 grams Fluid: 1.6 - 1.8 liters daily  Skin: intact  Diet Order: General  EDUCATION NEEDS: -No education needs identified at this time   Intake/Output Summary (Last 24 hours) at 07/26/12 1237 Last data filed at 07/26/12 1200  Gross per 24 hour  Intake   3858 ml  Output   1800 ml  Net   2058 ml   Last BM:  12/12  Labs:   Lab 07/26/12 0958 07/25/12 0546  NA 139 138  K 4.0 3.5  CL 108 102  CO2 24 26  BUN 7 9  CREATININE 0.57 0.94  CALCIUM 7.6* 8.8  MG -- --  PHOS -- --  GLUCOSE 111* 112*    CBG (last 3)  No results found for this basename: GLUCAP:3 in the last 72 hours  Scheduled Meds:   . enoxaparin (LOVENOX) injection  40 mg Subcutaneous Q24H  . fentaNYL  75 mcg Transdermal Q72H  . hydrocortisone sod succinate (SOLU-CORTEF) injection  50 mg Intravenous Q8H  . hydroxychloroquine  200 mg Oral BID  . mycophenolate  1,500 mg Oral BID  . oxyCODONE  30 mg Oral TID  . pantoprazole (PROTONIX) IV  40 mg Intravenous Q12H  . sodium chloride  3 mL Intravenous Q12H   Continuous Infusions:   . 0.9 % NaCl with KCl 20 mEq / L 150 mL/hr at 07/26/12 1200    Past Medical History  Diagnosis Date  . Lupus   . Pulmonary embolism   . GERD (gastroesophageal reflux disease)   . Chronic pain     Past Surgical History  Procedure Date  . Pericardial window     in 2007   Layia Walla MS, [place], [place] Pager: [phone] After-hours pager: [phone]

## 2012-07-26 NOTE — Progress Notes (Signed)
[facility] Progress Note [place] TEAM 1 - Stepdown/ICU TEAM   Elizabeth Baxter JXB:[id number] DOB: 1976-07-16 DOA: 07/25/2012 PCP: Gwynneth Aliment, MD  Brief narrative: 36 y.o. female with history of SLE on immunosuppressants (Plaquenil, CellCept and prednisone 20 mg daily), GERD, chronic pain, history of pulmonary embolism (completed anticoagulation with Xarelto approximately a month ago) presented to the ED on 12/15 with nausea, vomiting and generalized aches. She was in her usual state of health until 3 days ago when she started experiencing poor appetite and laid in bed all day. The next day he is a started having vomiting and has had 4 episodes thus far. Emesis consists of biliary material, medications that she has taken but no blood or coffee grounds. She denies fevers or chills. Abdominal pain only when palpated. No diarrhea. No sickly contacts or eating anything unusual. Last BM 4 days ago. Passing flatus. She was able to tolerate only ginger ale. Feels thirsty at this time. She also complained of feeling dizzy on ambulating. She has not passed out. Since the last today she is also noticed generalized aches and pains and swelling of small joints of her hands which is typical for her lupus flare. She has chronic skin rash on her upper chest, back, on and thighs which apparently have been slowly progressing over the last 2-3 months. She denies cough, chest pain or dyspnea. She denies dysuria or urinary frequency. No open wounds. In the ED she was tachypneic and hypotensive. She had low-grade fever (100.35F) normal white cell count. Chest x-ray did not show any clear infiltrate. Due to prior history of PE and tachypnea, CT angiogram of chest was done-no PE or acute findings. Patient was bolused with IV fluids, received a dose of IV Levaquin, full dose Lovenox x1 (prior to CT), IV hydrocortisone x1 and hospitalist service were requested to admit for further evaluation and management. Patient  indicates that she feels significantly better since ED arrival.  Assessment/Plan:  N/V/D Likely viral gastroenteritis - cont supportive care  Orthostasis - hypotension Agree w/ stress dose steroids in this clinical setting - cont hydration - attempt to ambulate w/ assist and test for persistent orthostasis  SLE Cont usual immunosuppressive meds + stress dose steroids  Anemia of chronic disease No evidence of acute blood loss - recheck CBC in AM  Chronic pain Cont home meds  Hx of PE Now off anticoag - cont DVT prophylaxis  Code Status: FULL Disposition Plan: transfer to med bed - possible d/c in AM if orthostasis improved and tolerating regular diet  Consultants: none  Procedures: none  Antibiotics: none  DVT prophylaxis: lovenox  HPI/Subjective: Pt reports that she is feeling somewhat better today.  Had some diarrhea this morning.  Tolerating clear liquids w/o difficulty.  No chest pain or sob.  No hematemesis or melena or hematochezia.     Objective: Blood pressure 92/56, pulse 63, temperature 97.7 F (36.5 C), temperature source Oral, resp. rate 18, height 5\' 7"  (1.702 m), weight 87.7 kg (193 lb 5.5 oz), last menstrual period 07/04/2012, SpO2 100.00%.  Intake/Output Summary (Last 24 hours) at 07/26/12 1744 Last data filed at 07/26/12 1700  Gross per 24 hour  Intake   4813 ml  Output   1800 ml  Net   3013 ml     Exam: General: No acute respiratory distress Lungs: Clear to auscultation bilaterally without wheezes or crackles Cardiovascular: Regular rate and rhythm without murmur gallop or rub Abdomen: Nontender, nondistended, soft, bowel sounds positive, no  rebound, no ascites, no appreciable mass Extremities: No significant cyanosis, clubbing, or edema bilateral lower extremities  Data Reviewed: Basic Metabolic Panel:  Lab 07/26/12 9604 07/25/12 0546  NA 139 138  K 4.0 3.5  CL 108 102  CO2 24 26  GLUCOSE 111* 112*  BUN 7 9  CREATININE 0.57 0.94   CALCIUM 7.6* 8.8  MG -- --  PHOS -- --   Liver Function Tests:  Lab 07/25/12 0546  AST 42*  ALT 10  ALKPHOS 41  BILITOT 0.3  PROT 8.0  ALBUMIN 2.9*    Lab 07/25/12 1758  LIPASE 36  AMYLASE --   CBC:  Lab 07/26/12 0958 07/25/12 0546  WBC 3.2* 7.1  NEUTROABS -- 5.1  HGB 10.2* 11.3*  HCT 33.7* 36.7  MCV 80.8 81.0  PLT 248 281    Recent Results (from the past 240 hour(s))  URINE CULTURE     Status: Normal   Collection Time   07/25/12  6:28 AM      Component Value Range Status Comment   Specimen Description URINE, RANDOM   Final    Special Requests ADDED 0709   Final    Culture  Setup Time 07/25/2012 16:04   Final    Colony Count 60,000 COLONIES/ML   Final    Culture     Final    Value: Multiple bacterial morphotypes present, none predominant. Suggest appropriate recollection if clinically indicated.   Report Status 07/26/2012 FINAL   Final   CULTURE, BLOOD (ROUTINE X 2)     Status: Normal (Preliminary result)   Collection Time   07/25/12  7:55 AM      Component Value Range Status Comment   Specimen Description BLOOD LEFT ARM   Final    Special Requests BOTTLES DRAWN AEROBIC AND ANAEROBIC 10CC   Final    Culture  Setup Time 07/25/2012 16:06   Final    Culture     Final    Value:        BLOOD CULTURE RECEIVED NO GROWTH TO DATE CULTURE WILL BE HELD FOR 5 DAYS BEFORE ISSUING A FINAL NEGATIVE REPORT   Report Status PENDING   Incomplete   CULTURE, BLOOD (ROUTINE X 2)     Status: Normal (Preliminary result)   Collection Time   07/25/12  8:05 AM      Component Value Range Status Comment   Specimen Description BLOOD RIGHT HAND   Final    Special Requests BOTTLES DRAWN AEROBIC AND ANAEROBIC 10CC   Final    Culture  Setup Time 07/25/2012 16:06   Final    Culture     Final    Value:        BLOOD CULTURE RECEIVED NO GROWTH TO DATE CULTURE WILL BE HELD FOR 5 DAYS BEFORE ISSUING A FINAL NEGATIVE REPORT   Report Status PENDING   Incomplete   MRSA PCR SCREENING     Status:  Normal   Collection Time   07/25/12  9:50 AM      Component Value Range Status Comment   MRSA by PCR NEGATIVE  NEGATIVE Final      Studies:  Recent x-ray studies have been reviewed in detail by the Attending Physician  Scheduled Meds:  Reviewed in detail by the Attending Physician   Lonia Blood, MD [facility] Office  [phone] Pager [phone]  On-Call/Text Page:      [email or web address]      password [email or web address]  If 7PM-7AM, please contact night-coverage [email or web address]  Password [email or web address] 07/26/2012, 5:44 PM   LOS: 1 day

## 2012-07-27 DIAGNOSIS — K219 Gastro-esophageal reflux disease without esophagitis: Secondary | ICD-10-CM

## 2012-07-27 DIAGNOSIS — Z86711 Personal history of pulmonary embolism: Secondary | ICD-10-CM

## 2012-07-27 LAB — GLUCOSE, CAPILLARY
Glucose-Capillary: 111 mg/dL — ABNORMAL HIGH (ref 70–99)
Glucose-Capillary: 114 mg/dL — ABNORMAL HIGH (ref 70–99)

## 2012-07-27 LAB — CBC
HCT: 31.9 % — ABNORMAL LOW (ref 36.0–46.0)
Hemoglobin: 9.9 g/dL — ABNORMAL LOW (ref 12.0–15.0)
MCV: 81.6 fL (ref 78.0–100.0)
Platelets: 280 10*3/uL (ref 150–400)
RBC: 3.91 MIL/uL (ref 3.87–5.11)
WBC: 3.6 10*3/uL — ABNORMAL LOW (ref 4.0–10.5)

## 2012-07-27 MED ORDER — HYDROCORTISONE SOD SUCCINATE 100 MG IJ SOLR
100.0000 mg | Freq: Three times a day (TID) | INTRAMUSCULAR | Status: DC
  Administered 2012-07-27 – 2012-07-28 (×3): 100 mg via INTRAVENOUS
  Filled 2012-07-27 (×6): qty 2

## 2012-07-27 MED ORDER — PANTOPRAZOLE SODIUM 40 MG PO TBEC
40.0000 mg | DELAYED_RELEASE_TABLET | Freq: Two times a day (BID) | ORAL | Status: DC
  Administered 2012-07-27 – 2012-07-28 (×2): 40 mg via ORAL
  Filled 2012-07-27 (×2): qty 1

## 2012-07-27 MED ORDER — POTASSIUM CHLORIDE IN NACL 20-0.9 MEQ/L-% IV SOLN
INTRAVENOUS | Status: AC
  Administered 2012-07-27: 125 mL/h via INTRAVENOUS
  Administered 2012-07-28: 04:00:00 via INTRAVENOUS
  Filled 2012-07-27 (×4): qty 1000

## 2012-07-27 NOTE — Evaluation (Signed)
Physical Therapy Evaluation Patient Details Name: Elizabeth Baxter MRN: [id number] DOB: September 01, 1975 Today's Date: 07/27/2012 Time: 4098-1191 PT Time Calculation (min): 19 min  PT Assessment / Plan / Recommendation Clinical Impression  Pt admitted with dizziness/nausea however reports feeling much better. Pt functioning at baseline and demonstrates safe independent mobility in room and hallway. Patient with no skilled PT needs at this time. PT signing off. Pt safe to d/c home when medically stable. Please re-consult if skilled PT needed in future.    PT Assessment  Patent does not need any further PT services    Follow Up Recommendations  No PT follow up    Does the patient have the potential to tolerate intense rehabilitation      Barriers to Discharge        Equipment Recommendations  None recommended by PT    Recommendations for Other Services     Frequency      Precautions / Restrictions Restrictions Weight Bearing Restrictions: No   Pertinent Vitals/Pain 0/10      Mobility  Bed Mobility Bed Mobility: Supine to Sit;Sit to Supine Supine to Sit: 7: Independent Sit to Supine: 7: Independent Transfers Transfers: Sit to Stand;Stand to Sit Sit to Stand: 7: Independent;From bed Stand to Sit: 7: Independent;To bed Ambulation/Gait Ambulation/Gait Assistance: 7: Independent Ambulation Distance (Feet): 300 Feet Assistive device: None Ambulation/Gait Assistance Details: WFL, ambulating at baseline, No LOB Gait Pattern: Within Functional Limits Gait velocity: wfl Stairs: No (no steps at home)    Shoulder Instructions     Exercises     PT Diagnosis:    PT Problem List:   PT Treatment Interventions:     PT Goals Acute Rehab PT Goals PT Goal Formulation:  (n/a)  Visit Information  Last PT Received On: 07/27/12 Assistance Needed: +1    Subjective Data  Subjective: Pt received standing at sink bathing.  Patient Stated Goal: home   Prior Functioning  Home  Living Lives With: Family Available Help at Discharge: Family (patient and spouse works, 89 yo dtr in school) Type of Home: Apartment Home Access: Level entry Home Layout: One level Bathroom Shower/Tub: Engineer, manufacturing systems: Pharmacist, community: Yes How Accessible: Accessible via walker Home Adaptive Equipment: None Additional Comments: pt reports her mother still has shower seat and walker Prior Function Level of Independence: Independent Able to Take Stairs?: Yes Driving: Yes Vocation: Full time employment Communication Communication: No difficulties Dominant Hand: Right    Cognition  Overall Cognitive Status: Appears within functional limits for tasks assessed/performed Arousal/Alertness: Awake/alert Orientation Level: Oriented X4 / Intact Behavior During Session: [facility] for tasks performed    Extremity/Trunk Assessment Right Upper Extremity Assessment RUE ROM/Strength/Tone: Within functional levels Left Upper Extremity Assessment LUE ROM/Strength/Tone: Within functional levels Right Lower Extremity Assessment RLE ROM/Strength/Tone: Within functional levels Left Lower Extremity Assessment LLE ROM/Strength/Tone: Within functional levels Trunk Assessment Trunk Assessment: Normal   Balance Balance Balance Assessed: Yes Dynamic Standing Balance Dynamic Standing - Balance Support: No upper extremity supported Dynamic Standing - Level of Assistance: 7: Independent Dynamic Standing - Balance Activities:  (completed sponge bath at sink)  End of Session PT - End of Session Activity Tolerance: Patient tolerated treatment well Patient left: in bed;with call bell/phone within reach;with nursing in room Nurse Communication: Mobility status  GP     Marcene Brawn 07/27/2012, 12:49 PM Lewis Shock, PT, DPT Pager #: [phone] Office #: [phone]

## 2012-07-27 NOTE — Progress Notes (Addendum)
[facility]                                                                                Patient Demographics  Elizabeth Baxter, is a 36 y.o. female  WUJ:[id number]  [id number]:[id number]  DOB - [birth year]  Admit date - 07/25/2012  Admitting Physician Elease Etienne, MD  Outpatient Primary MD for the patient is Gwynneth Aliment, MD  LOS - 2   Chief Complaint  Patient presents with  . Lupus flare up   . Emesis        Assessment & Plan    Principal Problem:  *Nausea and vomiting Active Problems:  Dehydration  Hypotension  SLE exacerbation  Chronic pain  GERD (gastroesophageal reflux disease)  History of pulmonary embolism    Brief narrative:  36 y.o. female with history of SLE on immunosuppressants (Plaquenil, CellCept and prednisone 20 mg daily), GERD, chronic pain, history of pulmonary embolism (completed anticoagulation with Xarelto approximately a month ago) presented to the ED on 12/15 with nausea, vomiting and generalized aches. She was in her usual state of health until 3 days ago when she started experiencing poor appetite and laid in bed all day. The next day he is a started having vomiting and has had 4 episodes thus far. Emesis consists of biliary material, medications that she has taken but no blood or coffee grounds. She denies fevers or chills. Abdominal pain only when palpated. No diarrhea. No sickly contacts or eating anything unusual. Last BM 4 days ago. Passing flatus. She was able to tolerate only ginger ale. Feels thirsty at this time. She also complained of feeling dizzy on ambulating. She has not passed out. Since the last today she is also noticed generalized aches and pains and swelling of small joints of her hands which is typical for her lupus flare. She has chronic skin rash on her upper chest, back, on and thighs which apparently have been slowly progressing over the last 2-3 months. She denies cough, chest pain or dyspnea. She denies  dysuria or urinary frequency. No open wounds. In the ED she was tachypneic and hypotensive. She had low-grade fever (100.31F) normal white cell count. Chest x-ray did not show any clear infiltrate. Due to prior history of PE and tachypnea, CT angiogram of chest was done-no PE or acute findings. Patient was bolused with IV fluids, received a dose of IV Levaquin, full dose Lovenox x1 (prior to CT), IV hydrocortisone x1 and hospitalist service were requested to admit for further evaluation and management. Patient indicates that she feels significantly better since ED arrival.    Assessment/Plan:    N/V/D  Likely viral gastroenteritis - cont supportive care, much improved, no emesis, diarrhea improving, husband has similar symptoms suggesting infectious etiology of her gastroenteritis.    Nonspecific Esophageal thickening on CT scan.   One time outpatient followup with GI recommended upon discharge.   Orthostasis - hypotension  Agree w/ stress dose steroids in this clinical setting have adjusted the dose further - cont IV fluids for hydration - attempt to ambulate w/ assist and test for persistent ortho stasis. Monitor electrolytes.     SLE  Cont usual immunosuppressive meds +  stress dose steroids    Anemia of chronic disease  No evidence of acute blood loss -stable.   Chronic pain  Cont home meds    Hx of PE  Now off anticoag - cont DVT prophylaxis      Code Status: Full  Family Communication: Discussed with the patient  Disposition Plan: Home   Procedures none   Consults  none   DVT Prophylaxis  Lovenox   Lab Results  Component Value Date   PLT 280 07/27/2012    Medications  Scheduled Meds:   . fentaNYL  75 mcg Transdermal Q72H  . hydrocortisone sod succinate (SOLU-CORTEF) injection  100 mg Intravenous Q8H  . hydroxychloroquine  200 mg Oral BID  . mycophenolate  1,500 mg Oral BID  . oxyCODONE  30 mg Oral TID  . pantoprazole (PROTONIX) IV  40 mg  Intravenous Q12H  . sodium chloride  3 mL Intravenous Q12H   Continuous Infusions:   . 0.9 % NaCl with KCl 20 mEq / L     PRN Meds:.acetaminophen, acetaminophen, albuterol, alum & mag hydroxide-simeth, feeding supplement, HYDROmorphone (DILAUDID) injection, ondansetron (ZOFRAN) IV  Antibiotics    Anti-infectives     Start     Dose/Rate Route Frequency Ordered Stop   07/25/12 1230   hydroxychloroquine (PLAQUENIL) tablet 200 mg        200 mg Oral 2 times daily 07/25/12 1121     07/25/12 0745   levofloxacin (LEVAQUIN) IVPB 750 mg        750 mg 100 mL/hr over 90 Minutes Intravenous  Once 07/25/12 0743 07/25/12 0950           Time Spent in minutes   35   Arelie Kuzel K M.D on 07/27/2012 at 10:24 AM  Between 7am to 7pm - Pager - [phone]  After 7pm go to [email or web address] - password [email or web address]  And look for the night coverage person covering for me after hours  [facility] Office  [phone]    Subjective:   Elizabeth Baxter today has, No headache, No chest pain, No abdominal pain - No Nausea, No new weakness tingling or numbness, No Cough - SOB. Mild improving diarrhea  Objective:   Filed Vitals:   07/26/12 1538 07/26/12 1920 07/26/12 2050 07/27/12 0600  BP: 92/56 91/66  99/65  Pulse: 63 66 60 56  Temp: 97.7 F (36.5 C) 98.1 F (36.7 C)  97.7 F (36.5 C)  TempSrc: Oral Oral  Oral  Resp: 18 18  18   Height:      Weight:      SpO2: 100% 100% 100% 100%    Wt Readings from Last 3 Encounters:  07/26/12 87.7 kg (193 lb 5.5 oz)  12/22/11 89.359 kg (197 lb)     Intake/Output Summary (Last 24 hours) at 07/27/12 1025 Last data filed at 07/27/12 0600  Gross per 24 hour  Intake   2220 ml  Output    950 ml  Net   1270 ml    Exam Awake Alert, Oriented X 3, No new F.N deficits, Normal affect [place].AT,PERRAL Supple Neck,No JVD, No cervical lymphadenopathy appriciated.  Symmetrical Chest wall movement, Good air movement bilaterally, CTAB RRR,No  Gallops,Rubs or new Murmurs, No Parasternal Heave +ve B.Sounds, Abd Soft, Non tender, No organomegaly appriciated, No rebound - guarding or rigidity. No Cyanosis, Clubbing or edema, No new Rash or bruise     Data Review   Micro Results Recent Results (from the past 240 hour(s))  URINE CULTURE  Status: Normal   Collection Time   07/25/12  6:28 AM      Component Value Range Status Comment   Specimen Description URINE, RANDOM   Final    Special Requests ADDED 0709   Final    Culture  Setup Time 07/25/2012 16:04   Final    Colony Count 60,000 COLONIES/ML   Final    Culture     Final    Value: Multiple bacterial morphotypes present, none predominant. Suggest appropriate recollection if clinically indicated.   Report Status 07/26/2012 FINAL   Final   CULTURE, BLOOD (ROUTINE X 2)     Status: Normal (Preliminary result)   Collection Time   07/25/12  7:55 AM      Component Value Range Status Comment   Specimen Description BLOOD LEFT ARM   Final    Special Requests BOTTLES DRAWN AEROBIC AND ANAEROBIC 10CC   Final    Culture  Setup Time 07/25/2012 16:06   Final    Culture     Final    Value:        BLOOD CULTURE RECEIVED NO GROWTH TO DATE CULTURE WILL BE HELD FOR 5 DAYS BEFORE ISSUING A FINAL NEGATIVE REPORT   Report Status PENDING   Incomplete   CULTURE, BLOOD (ROUTINE X 2)     Status: Normal (Preliminary result)   Collection Time   07/25/12  8:05 AM      Component Value Range Status Comment   Specimen Description BLOOD RIGHT HAND   Final    Special Requests BOTTLES DRAWN AEROBIC AND ANAEROBIC 10CC   Final    Culture  Setup Time 07/25/2012 16:06   Final    Culture     Final    Value:        BLOOD CULTURE RECEIVED NO GROWTH TO DATE CULTURE WILL BE HELD FOR 5 DAYS BEFORE ISSUING A FINAL NEGATIVE REPORT   Report Status PENDING   Incomplete   MRSA PCR SCREENING     Status: Normal   Collection Time   07/25/12  9:50 AM      Component Value Range Status Comment   MRSA by PCR NEGATIVE   NEGATIVE Final     Radiology Reports Ct Angio Chest Pe W/cm &/or Wo Cm  07/25/2012  *RADIOLOGY REPORT*  Clinical Data: Short of breath, fever, evaluate for recurrent pulmonary emboli  CT ANGIOGRAPHY CHEST  Technique:  Multidetector CT imaging of the chest using the standard protocol during bolus administration of intravenous contrast. Multiplanar reconstructed images including MIPs were obtained and reviewed to evaluate the vascular anatomy.  Contrast: 80mL OMNIPAQUE IOHEXOL 350 MG/ML SOLN  Comparison: Most recent CT PE study 04/01/2012  Findings:  Mediastinum: Unremarkable CT appearance of the thyroid gland. Nonspecific  mediastinal and predominately right hilar lymphadenopathy appears similar to prior. Index right hilar nodal tissue on image 39 of series 4 measures 15 mm compared to 16 mm previously. Thoracic esophagus is diffusely and mildly patulous and contains frothy debris along its course.  There may be some mild circumferential thickening distally.  Heart/Vascular: Stable cardiomegaly.  No pericardial effusion. Adequate opacification of the pulmonary artery to the segmental branches.  Evaluation of the subsegmental arterial tree limited secondary to contrast bolus and motion artifact.  No central filling defect to suggest acute pulmonary embolus.  Conventional three-vessel aortic arch.  No acute dissection or aneurysmal dilatation.  Lungs/Pleura: Similar appearance of predominantly peripheral and basilar distribution of subpleural reticulation, honeycombing, bronchiectasis bibasilar ground-glass attenuation and architectural  distortion consistent with pulmonary fibrosis. No new focal airspace consolidation or areas of ground-glass opacification to suggest active inflammation. No pleural effusion or pneumothorax. No suspicious pulmonary nodules.  Upper Abdomen:  Limited imaging of the upper abdomen is unremarkable.  Bones: No acute fracture or aggressive appearing lytic or blastic osseous lesion.   IMPRESSION:  1.  Negative for acute pulmonary embolus or other acute cardiopulmonary process.  2.  Similar appearance of peripheral and basilar predominant pulmonary fibrosis and interstitial lung disease without significant interval progression compared to 04/01/2012.  3.  Diffusely patulous esophagus containing frothy debris with mild circumferential thickening distally.  This may be related to the patient's underlying autoimmune disorder or gastroesophageal reflux disease.  4.  Stable mediastinal, right hilar and axillary adenopathy.  5.  Stable cardiomegaly.   Original Report Authenticated By: Malachy Moan, M.D.    Dg Chest Port 1 View  07/25/2012  *RADIOLOGY REPORT*  Clinical Data: Chest pain  PORTABLE CHEST - 1 VIEW  Comparison: Chest radiograph 04/01/2012, CT thorax 04/01/2012  Findings: Stable enlarged heart silhouette.  There are bilateral pleural effusions and interstitial lung disease which is increased are increased prior.  Bibasilar atelectasis similar to prior.  No pneumothorax.  IMPRESSION:  1.  Stable cardiomegaly. 2.  Increase in bilateral pleural effusions and persistent bibasilar interstitial lung disease.   Original Report Authenticated By: Genevive Bi, M.D.     CBC  Lab 07/27/12 0500 07/26/12 0958 07/25/12 0546  WBC 3.6* 3.2* 7.1  HGB 9.9* 10.2* 11.3*  HCT 31.9* 33.7* 36.7  PLT 280 248 281  MCV 81.6 80.8 81.0  MCH 25.3* 24.5* 24.9*  MCHC 31.0 30.3 30.8  RDW 14.8 14.7 14.7  LYMPHSABS -- -- 1.8  MONOABS -- -- 0.2  EOSABS -- -- 0.0  BASOSABS -- -- 0.0  BANDABS -- -- --    Chemistries   Lab 07/26/12 0958 07/25/12 0546  NA 139 138  K 4.0 3.5  CL 108 102  CO2 24 26  GLUCOSE 111* 112*  BUN 7 9  CREATININE 0.57 0.94  CALCIUM 7.6* 8.8  MG -- --  AST -- 42*  ALT -- 10  ALKPHOS -- 41  BILITOT -- 0.3   ------------------------------------------------------------------------------------------------------------------ estimated creatinine clearance is 112.6  ml/min (by C-G formula based on Cr of 0.57). ------------------------------------------------------------------------------------------------------------------ No results found for this basename: HGBA1C:2 in the last 72 hours ------------------------------------------------------------------------------------------------------------------ No results found for this basename: CHOL:2,HDL:2,LDLCALC:2,TRIG:2,CHOLHDL:2,LDLDIRECT:2 in the last 72 hours ------------------------------------------------------------------------------------------------------------------ No results found for this basename: TSH,T4TOTAL,FREET3,T3FREE,THYROIDAB in the last 72 hours ------------------------------------------------------------------------------------------------------------------ No results found for this basename: VITAMINB12:2,FOLATE:2,FERRITIN:2,TIBC:2,IRON:2,RETICCTPCT:2 in the last 72 hours  Coagulation profile No results found for this basename: INR:5,PROTIME:5 in the last 168 hours  No results found for this basename: DDIMER:2 in the last 72 hours  Cardiac Enzymes No results found for this basename: CK:3,CKMB:3,TROPONINI:3,MYOGLOBIN:3 in the last 168 hours ------------------------------------------------------------------------------------------------------------------ No components found with this basename: POCBNP:3

## 2012-07-28 ENCOUNTER — Encounter (HOSPITAL_COMMUNITY): Payer: Self-pay | Admitting: Emergency Medicine

## 2012-07-28 ENCOUNTER — Inpatient Hospital Stay (HOSPITAL_COMMUNITY)
Admission: EM | Admit: 2012-07-28 | Discharge: 2012-08-03 | DRG: 391 | Disposition: A | Discharge status: Home / Self Care | Attending: Internal Medicine | Admitting: Internal Medicine

## 2012-07-28 DIAGNOSIS — L930 Discoid lupus erythematosus: Secondary | ICD-10-CM | POA: Diagnosis present

## 2012-07-28 DIAGNOSIS — I951 Orthostatic hypotension: Secondary | ICD-10-CM

## 2012-07-28 DIAGNOSIS — I959 Hypotension, unspecified: Secondary | ICD-10-CM

## 2012-07-28 DIAGNOSIS — Z86711 Personal history of pulmonary embolism: Secondary | ICD-10-CM

## 2012-07-28 DIAGNOSIS — J189 Pneumonia, unspecified organism: Secondary | ICD-10-CM

## 2012-07-28 DIAGNOSIS — Z79899 Other long term (current) drug therapy: Secondary | ICD-10-CM

## 2012-07-28 DIAGNOSIS — K219 Gastro-esophageal reflux disease without esophagitis: Secondary | ICD-10-CM

## 2012-07-28 DIAGNOSIS — E860 Dehydration: Secondary | ICD-10-CM

## 2012-07-28 DIAGNOSIS — K529 Noninfective gastroenteritis and colitis, unspecified: Secondary | ICD-10-CM | POA: Diagnosis present

## 2012-07-28 DIAGNOSIS — K829 Disease of gallbladder, unspecified: Secondary | ICD-10-CM | POA: Diagnosis present

## 2012-07-28 DIAGNOSIS — K859 Acute pancreatitis without necrosis or infection, unspecified: Secondary | ICD-10-CM | POA: Diagnosis present

## 2012-07-28 DIAGNOSIS — Z86718 Personal history of other venous thrombosis and embolism: Secondary | ICD-10-CM

## 2012-07-28 DIAGNOSIS — K5289 Other specified noninfective gastroenteritis and colitis: Principal | ICD-10-CM | POA: Diagnosis present

## 2012-07-28 DIAGNOSIS — N839 Noninflammatory disorder of ovary, fallopian tube and broad ligament, unspecified: Secondary | ICD-10-CM | POA: Diagnosis present

## 2012-07-28 DIAGNOSIS — R112 Nausea with vomiting, unspecified: Secondary | ICD-10-CM | POA: Diagnosis present

## 2012-07-28 DIAGNOSIS — M329 Systemic lupus erythematosus, unspecified: Secondary | ICD-10-CM

## 2012-07-28 DIAGNOSIS — G8929 Other chronic pain: Secondary | ICD-10-CM | POA: Diagnosis present

## 2012-07-28 DIAGNOSIS — E876 Hypokalemia: Secondary | ICD-10-CM | POA: Diagnosis present

## 2012-07-28 DIAGNOSIS — D649 Anemia, unspecified: Secondary | ICD-10-CM

## 2012-07-28 LAB — CBC WITH DIFFERENTIAL/PLATELET
Basophils Relative: 0 % (ref 0–1)
Eosinophils Absolute: 0 10*3/uL (ref 0.0–0.7)
Hemoglobin: 9.9 g/dL — ABNORMAL LOW (ref 12.0–15.0)
MCH: 25.1 pg — ABNORMAL LOW (ref 26.0–34.0)
MCHC: 30.8 g/dL (ref 30.0–36.0)
Monocytes Absolute: 0.5 10*3/uL (ref 0.1–1.0)
Monocytes Relative: 7 % (ref 3–12)
Neutrophils Relative %: 65 % (ref 43–77)

## 2012-07-28 LAB — COMPREHENSIVE METABOLIC PANEL
Albumin: 2.7 g/dL — ABNORMAL LOW (ref 3.5–5.2)
BUN: 6 mg/dL (ref 6–23)
Calcium: 7.8 mg/dL — ABNORMAL LOW (ref 8.4–10.5)
Creatinine, Ser: 0.64 mg/dL (ref 0.50–1.10)
Total Protein: 7.3 g/dL (ref 6.0–8.3)

## 2012-07-28 LAB — BASIC METABOLIC PANEL
Chloride: 109 mEq/L (ref 96–112)
Creatinine, Ser: 0.54 mg/dL (ref 0.50–1.10)
GFR calc Af Amer: >90 mL/min (ref >90)
Potassium: 3.8 mEq/L (ref 3.5–5.1)
Sodium: 138 mEq/L (ref 135–145)

## 2012-07-28 MED ORDER — HYDROMORPHONE HCL PF 1 MG/ML IJ SOLN
2.0000 mg | Freq: Once | INTRAMUSCULAR | Status: AC
  Administered 2012-07-29: 2 mg via INTRAVENOUS
  Filled 2012-07-28 (×2): qty 1

## 2012-07-28 MED ORDER — ENSURE COMPLETE PO LIQD: 237.0000 mL | Freq: Every day | ORAL | Status: DC | PRN

## 2012-07-28 MED ORDER — OXYCODONE HCL 5 MG PO TABS: 30.0000 mg | ORAL_TABLET | Freq: Three times a day (TID) | ORAL | Status: DC

## 2012-07-28 MED ORDER — PREDNISONE 20 MG PO TABS: 20.0000 mg | ORAL_TABLET | Freq: Every day | ORAL | Status: DC

## 2012-07-28 MED ORDER — ONDANSETRON HCL 4 MG/2ML IJ SOLN
4.0000 mg | Freq: Once | INTRAMUSCULAR | Status: AC
  Administered 2012-07-29: 4 mg via INTRAVENOUS
  Filled 2012-07-28: qty 2

## 2012-07-28 NOTE — ED Provider Notes (Signed)
History     CSN: [id number]  Arrival date & time 07/28/12  4696   First MD Initiated Contact with Patient 07/28/12 2306      Chief Complaint  Patient presents with  . Emesis  . Headache    (Consider location/radiation/quality/duration/timing/severity/associated sxs/prior treatment) Patient is a 36 y.o. female presenting with vomiting and headaches. The history is provided by the patient.  Emesis  Associated symptoms include headaches.  Headache  Associated symptoms include vomiting.   patient here after having nonbilious vomiting and watery diarrhea x1 day. She was just discharged the hospital today after a four-day stay for similar symptoms. No fever or abdominal pain. No urinary symptoms. No vaginal bleeding or discharge. Denies any chest pain or dyspnea. She cannot keep down her medications or chronic pain from her lupus. She used her home meds for nausea without relief  Past Medical History  Diagnosis Date  . Lupus   . Pulmonary embolism   . GERD (gastroesophageal reflux disease)   . Chronic pain     Past Surgical History  Procedure Date  . Pericardial window     in 2007    No family history on file.  History  Substance Use Topics  . Smoking status: Never Smoker   . Smokeless tobacco: Not on file  . Alcohol Use: No    OB History    Grav Para Term Preterm Abortions TAB SAB Ect Mult Living                  Review of Systems  Gastrointestinal: Positive for vomiting.  Neurological: Positive for headaches.  All other systems reviewed and are negative.    Allergies  Imuran and Tramadol  Home Medications   Current Outpatient Rx  Name  Route  Sig  Dispense  Refill  . ESOMEPRAZOLE MAGNESIUM 20 MG PO CPDR   Oral   Take 20 mg by mouth daily before breakfast.          . FENTANYL 75 MCG/HR TD PT72   Transdermal   Place 1 patch onto the skin every 3 (three) days.          Marland Kitchen HYDROXYCHLOROQUINE SULFATE 200 MG PO TABS   Oral   Take 200 mg by mouth 2  (two) times daily.          Marland Kitchen MYCOPHENOLATE MOFETIL 500 MG PO TABS   Oral   Take 1,500 mg by mouth 2 (two) times daily.          . OXYCODONE HCL 30 MG PO TABS   Oral   Take 30 mg by mouth 3 (three) times daily.         . ALBUTEROL SULFATE HFA 108 (90 BASE) MCG/ACT IN AERS   Inhalation   Inhale 2 puffs into the lungs every 4 (four) hours as needed. For shortness of breath         . ENSURE COMPLETE PO LIQD   Oral   Take 237 mLs by mouth daily as needed (per pt request, likes vanilla).   15 Bottle   0     BP 121/80  Pulse 58  Temp 97.8 F (36.6 C) (Oral)  Resp 18  SpO2 100%  LMP 07/04/2012  Physical Exam  Nursing note and vitals reviewed. Constitutional: She is oriented to person, place, and time. She appears well-developed and well-nourished.  Non-toxic appearance. No distress.  HENT:  Head: Normocephalic and atraumatic.  Eyes: Conjunctivae normal, EOM and lids are normal. Pupils are  equal, round, and reactive to light.  Neck: Normal range of motion. Neck supple. No tracheal deviation present. No mass present.  Cardiovascular: Normal rate, regular rhythm and normal heart sounds.  Exam reveals no gallop.   No murmur heard. Pulmonary/Chest: Effort normal and breath sounds normal. No stridor. No respiratory distress. She has no decreased breath sounds. She has no wheezes. She has no rhonchi. She has no rales.  Abdominal: Soft. Normal appearance and bowel sounds are normal. She exhibits no distension. There is no tenderness. There is no rebound and no CVA tenderness.  Musculoskeletal: Normal range of motion. She exhibits no edema and no tenderness.  Neurological: She is alert and oriented to person, place, and time. She has normal strength. No cranial nerve deficit or sensory deficit. GCS eye subscore is 4. GCS verbal subscore is 5. GCS motor subscore is 6.  Skin: Skin is warm and dry. No abrasion and no rash noted.  Psychiatric: She has a normal mood and affect. Her  speech is normal and behavior is normal.    ED Course  Procedures (including critical care time)  Labs Reviewed  CBC WITH DIFFERENTIAL - Abnormal; Notable for the following:    Hemoglobin 9.9 (*)     HCT 32.1 (*)     MCH 25.1 (*)     All other components within normal limits  COMPREHENSIVE METABOLIC PANEL - Abnormal; Notable for the following:    Potassium 2.9 (*)     Calcium 7.8 (*)     Albumin 2.7 (*)     AST 49 (*)     All other components within normal limits   No results found.   No diagnosis found.    MDM  Patient given IV fluids and pain medications. She still remains comfortable at this time. She'll be admitted for observation        Toy Baker, MD 07/29/12 [phone]

## 2012-07-28 NOTE — Progress Notes (Signed)
Discharge instructions reviewed with pt.  Pt verbalized understanding and had no questions.  Pt discharged in stable condition.  Elizabeth Baxter   

## 2012-07-28 NOTE — Discharge Summary (Signed)
[facility]                                                                                   Elizabeth Baxter, is a 36 y.o. female  DOB Jul 26, 1976  MRN [id number].  Admission date:  07/25/2012  Discharge Date:  07/28/2012  Primary MD  Gwynneth Aliment, MD  Admitting Physician  Elease Etienne, MD  Admission Diagnosis  Dehydration [276.51] CAP (community acquired pneumonia) [486] Nausea and vomiting [787.01] community acquired pneumonia  Discharge Diagnosis     Principal Problem:  *Nausea and vomiting Active Problems:  Dehydration  Hypotension  SLE exacerbation  Chronic pain  GERD (gastroesophageal reflux disease)  History of pulmonary embolism    Past Medical History  Diagnosis Date  . Lupus   . Pulmonary embolism   . GERD (gastroesophageal reflux disease)   . Chronic pain     Past Surgical History  Procedure Date  . Pericardial window     in 2007       Discharge Diagnoses:   Principal Problem:  *Nausea and vomiting Active Problems:  Dehydration  Hypotension  SLE exacerbation  Chronic pain  GERD (gastroesophageal reflux disease)  History of pulmonary embolism    Discharge Condition: Stable   Diet recommendation: See Discharge Instructions below   Consults None   History of present illness and  Hospital Course:  See H&P, Labs, Consult and Test reports for all details in brief, patient was admitted for nausea vomiting along with diarrhea due to viral gastroenteritis, symptoms are much better, patient did have some dehydration along with orthostatic hypotension every 2 her symptoms, she is back to her baseline with IV fluids and supportive care, of note patient has history of lupus for which she is on immunosuppressants and that is being continued, she did receive stress dose steroids during her acute illness for low blood pressures, her home dose steroid will be continued however I have increased her home dose steroid for the next  4 days, she is completely symptom free today, she has had 2 BMs yesterday and none today, she has no nausea at this time. Her husband also has similar symptoms suggesting viral infectious etiology.  Should in the past had history of PE, she has recently finished her anticoagulations treatment.   Her home pain medications will be continued for her chronic pain, outpatient followup for anemia of chronic disease.   I have recommended patient follow with gastroenterologist Dr. Leone Payor one time in the next few weeks due to nonspecific findings on CT scan which showed esophageal thickening.     Today   Subjective:   [name] today has no headache,no chest abdominal pain,no new weakness tingling or numbness, feels much better wants to go home today.     Objective:   Blood pressure 121/75, pulse 50, temperature 98 F (36.7 C), temperature source Oral, resp. rate 17, height 5\' 7"  (1.702 m), weight 87.7 kg (193 lb 5.5 oz), last menstrual period 07/04/2012, SpO2 99.00%.   Intake/Output Summary (Last 24 hours) at 07/28/12 1006 Last data filed at 07/28/12 0600  Gross per 24 hour  Intake 2797.5 ml  Output  450 ml  Net 2347.5 ml    Exam Awake Alert, Oriented *3, No new F.N deficits, Normal affect [place].AT,PERRAL Supple Neck,No JVD, No cervical lymphadenopathy appriciated.  Symmetrical Chest wall movement, Good air movement bilaterally, CTAB RRR,No Gallops,Rubs or new Murmurs, No Parasternal Heave +ve B.Sounds, Abd Soft, Non tender, No organomegaly appriciated, No rebound -guarding or rigidity. No Cyanosis, Clubbing or edema, No new Rash or bruise  Data Review   Major procedures and Radiology Reports - PLEASE review detailed and final reports for all details in brief -    Ct Angio Chest Pe W/cm &/or Wo Cm  07/25/2012  *RADIOLOGY REPORT*  Clinical Data: Short of breath, fever, evaluate for recurrent pulmonary emboli  CT ANGIOGRAPHY CHEST  Technique:  Multidetector CT imaging of the  chest using the standard protocol during bolus administration of intravenous contrast. Multiplanar reconstructed images including MIPs were obtained and reviewed to evaluate the vascular anatomy.  Contrast: 80mL OMNIPAQUE IOHEXOL 350 MG/ML SOLN  Comparison: Most recent CT PE study 04/01/2012  Findings:  Mediastinum: Unremarkable CT appearance of the thyroid gland. Nonspecific  mediastinal and predominately right hilar lymphadenopathy appears similar to prior. Index right hilar nodal tissue on image 39 of series 4 measures 15 mm compared to 16 mm previously. Thoracic esophagus is diffusely and mildly patulous and contains frothy debris along its course.  There may be some mild circumferential thickening distally.  Heart/Vascular: Stable cardiomegaly.  No pericardial effusion. Adequate opacification of the pulmonary artery to the segmental branches.  Evaluation of the subsegmental arterial tree limited secondary to contrast bolus and motion artifact.  No central filling defect to suggest acute pulmonary embolus.  Conventional three-vessel aortic arch.  No acute dissection or aneurysmal dilatation.  Lungs/Pleura: Similar appearance of predominantly peripheral and basilar distribution of subpleural reticulation, honeycombing, bronchiectasis bibasilar ground-glass attenuation and architectural distortion consistent with pulmonary fibrosis. No new focal airspace consolidation or areas of ground-glass opacification to suggest active inflammation. No pleural effusion or pneumothorax. No suspicious pulmonary nodules.  Upper Abdomen:  Limited imaging of the upper abdomen is unremarkable.  Bones: No acute fracture or aggressive appearing lytic or blastic osseous lesion.  IMPRESSION:  1.  Negative for acute pulmonary embolus or other acute cardiopulmonary process.  2.  Similar appearance of peripheral and basilar predominant pulmonary fibrosis and interstitial lung disease without significant interval progression compared to  04/01/2012.  3.  Diffusely patulous esophagus containing frothy debris with mild circumferential thickening distally.  This may be related to the patient's underlying autoimmune disorder or gastroesophageal reflux disease.  4.  Stable mediastinal, right hilar and axillary adenopathy.  5.  Stable cardiomegaly.   Original Report Authenticated By: Malachy Moan, M.D.    Dg Chest Port 1 View  07/25/2012  *RADIOLOGY REPORT*  Clinical Data: Chest pain  PORTABLE CHEST - 1 VIEW  Comparison: Chest radiograph 04/01/2012, CT thorax 04/01/2012  Findings: Stable enlarged heart silhouette.  There are bilateral pleural effusions and interstitial lung disease which is increased are increased prior.  Bibasilar atelectasis similar to prior.  No pneumothorax.  IMPRESSION:  1.  Stable cardiomegaly. 2.  Increase in bilateral pleural effusions and persistent bibasilar interstitial lung disease.   Original Report Authenticated By: Genevive Bi, M.D.     Micro Results     Recent Results (from the past 240 hour(s))  URINE CULTURE     Status: Normal   Collection Time   07/25/12  6:28 AM      Component Value Range Status Comment  Specimen Description URINE, RANDOM   Final    Special Requests ADDED 0709   Final    Culture  Setup Time 07/25/2012 16:04   Final    Colony Count 60,000 COLONIES/ML   Final    Culture     Final    Value: Multiple bacterial morphotypes present, none predominant. Suggest appropriate recollection if clinically indicated.   Report Status 07/26/2012 FINAL   Final   CULTURE, BLOOD (ROUTINE X 2)     Status: Normal (Preliminary result)   Collection Time   07/25/12  7:55 AM      Component Value Range Status Comment   Specimen Description BLOOD LEFT ARM   Final    Special Requests BOTTLES DRAWN AEROBIC AND ANAEROBIC 10CC   Final    Culture  Setup Time 07/25/2012 16:06   Final    Culture     Final    Value:        BLOOD CULTURE RECEIVED NO GROWTH TO DATE CULTURE WILL BE HELD FOR 5 DAYS  BEFORE ISSUING A FINAL NEGATIVE REPORT   Report Status PENDING   Incomplete   CULTURE, BLOOD (ROUTINE X 2)     Status: Normal (Preliminary result)   Collection Time   07/25/12  8:05 AM      Component Value Range Status Comment   Specimen Description BLOOD RIGHT HAND   Final    Special Requests BOTTLES DRAWN AEROBIC AND ANAEROBIC 10CC   Final    Culture  Setup Time 07/25/2012 16:06   Final    Culture     Final    Value:        BLOOD CULTURE RECEIVED NO GROWTH TO DATE CULTURE WILL BE HELD FOR 5 DAYS BEFORE ISSUING A FINAL NEGATIVE REPORT   Report Status PENDING   Incomplete   MRSA PCR SCREENING     Status: Normal   Collection Time   07/25/12  9:50 AM      Component Value Range Status Comment   MRSA by PCR NEGATIVE  NEGATIVE Final      CBC w Diff: Lab Results  Component Value Date   WBC 3.6* 07/27/2012   HGB 9.9* 07/27/2012   HCT 31.9* 07/27/2012   PLT 280 07/27/2012   LYMPHOPCT 25 07/25/2012   MONOPCT 2* 07/25/2012   EOSPCT 0 07/25/2012   BASOPCT 0 07/25/2012    CMP: Lab Results  Component Value Date   NA 138 07/28/2012   K 3.8 07/28/2012   CL 109 07/28/2012   CO2 23 07/28/2012   BUN 7 07/28/2012   CREATININE 0.54 07/28/2012   PROT 8.0 07/25/2012   ALBUMIN 2.9* 07/25/2012   BILITOT 0.3 07/25/2012   ALKPHOS 41 07/25/2012   AST 42* 07/25/2012   ALT 10 07/25/2012  .   Discharge Instructions     Follow with Primary MD Gwynneth Aliment, MD in 3-4 days   Get CBC, CMP, checked 3-4 days by Primary MD and again as instructed by your Primary MD.    Get Medicines reviewed and adjusted.  Please request your Prim.MD to go over all Hospital Tests and Procedure/Radiological results at the follow up, please get all Hospital records sent to your Prim MD by signing hospital release before you go home.  Activity: As tolerated with Full fall precautions use walker/cane & assistance as needed   Diet:  regular  For Heart failure patients - Check your Weight same time everyday,  if you gain over 2 pounds, or you develop  in leg swelling, experience more shortness of breath or chest pain, call your Primary MD immediately. Follow Cardiac Low Salt Diet and 1.8 lit/day fluid restriction.  Disposition Home   If you experience worsening of your admission symptoms, develop shortness of breath, life threatening emergency, suicidal or homicidal thoughts you must seek medical attention immediately by calling 911 or calling your MD immediately  if symptoms less severe.  You Must read complete instructions/literature along with all the possible adverse reactions/side effects for all the Medicines you take and that have been prescribed to you. Take any new Medicines after you have completely understood and accpet all the possible adverse reactions/side effects.   Do not drive and provide baby sitting services if your were admitted for syncope or siezures until you have seen by Primary MD or a Neurologist and advised to do so again.  Do not drive when taking Pain medications.    Do not take more than prescribed Pain, Sleep and Anxiety Medications  Special Instructions: If you have smoked or chewed Tobacco  in the last 2 yrs please stop smoking, stop any regular Alcohol  and or any Recreational drug use.  Wear Seat belts while driving.  Follow-up Information    Follow up with Gwynneth Aliment, MD. Schedule an appointment as soon as possible for a visit in 3 days.   Contact information:   [address] [place] [address] [phone]       Follow up with Stan Head, MD. Schedule an appointment as soon as possible for a visit in 1 week. (For nonspecific findings on CT scan in your esophagus)    Contact information:   [address] [place] [address] [phone]            Discharge Medications     Medication List     As of 07/28/2012 10:06 AM    START taking these medications         feeding supplement Liqd   Take  237 mLs by mouth daily as needed (per pt request, likes vanilla).      CHANGE how you take these medications         predniSONE 20 MG tablet   Commonly known as: DELTASONE   Take 1 tablet (20 mg total) by mouth daily. Take 3 pills daily for 2 days, then 2 pills daily for 2 days then back to 1 pill daily.   What changed: doctor's instructions      CONTINUE taking these medications         albuterol 108 (90 BASE) MCG/ACT inhaler   Commonly known as: PROVENTIL HFA;VENTOLIN HFA      CELLCEPT 500 MG tablet   Generic drug: mycophenolate      esomeprazole 20 MG capsule   Commonly known as: NEXIUM      fentaNYL 75 MCG/HR   Commonly known as: DURAGESIC - dosed mcg/hr      hydroxychloroquine 200 MG tablet   Commonly known as: PLAQUENIL      oxycodone 30 MG immediate release tablet   Commonly known as: ROXICODONE          Where to get your medications    These are the prescriptions that you need to pick up. We sent them to a specific pharmacy, so you will need to go there to get them.   [facility] - [place], [place] - [address]    [address]  [place] [address]    Phone: [phone]        feeding supplement Liqd         Information on where to get these meds is not yet available. Ask your nurse or doctor.         predniSONE 20 MG tablet               Total Time in preparing paper work, data evaluation and todays exam - 35 minutes  Leroy Sea M.D on 07/28/2012 at 10:06 AM  [facility] Office  [phone]

## 2012-07-28 NOTE — ED Notes (Signed)
C/o nausea, vomiting, diarrhea, and headache since Sunday morning.  Pt states she was discharged from hospital earlier today after being admitted 4 days for same. States she started vomiting again around 5pm today.

## 2012-07-29 ENCOUNTER — Inpatient Hospital Stay (HOSPITAL_COMMUNITY)

## 2012-07-29 DIAGNOSIS — K859 Acute pancreatitis without necrosis or infection, unspecified: Secondary | ICD-10-CM

## 2012-07-29 DIAGNOSIS — E876 Hypokalemia: Secondary | ICD-10-CM | POA: Diagnosis present

## 2012-07-29 DIAGNOSIS — K219 Gastro-esophageal reflux disease without esophagitis: Secondary | ICD-10-CM

## 2012-07-29 DIAGNOSIS — G8929 Other chronic pain: Secondary | ICD-10-CM

## 2012-07-29 DIAGNOSIS — R112 Nausea with vomiting, unspecified: Secondary | ICD-10-CM

## 2012-07-29 DIAGNOSIS — Z86711 Personal history of pulmonary embolism: Secondary | ICD-10-CM

## 2012-07-29 DIAGNOSIS — L930 Discoid lupus erythematosus: Secondary | ICD-10-CM

## 2012-07-29 DIAGNOSIS — K529 Noninfective gastroenteritis and colitis, unspecified: Secondary | ICD-10-CM | POA: Diagnosis present

## 2012-07-29 DIAGNOSIS — I959 Hypotension, unspecified: Secondary | ICD-10-CM

## 2012-07-29 DIAGNOSIS — E860 Dehydration: Secondary | ICD-10-CM

## 2012-07-29 LAB — LIPASE, BLOOD
Lipase: 181 U/L — ABNORMAL HIGH (ref 11–59)
Lipase: 257 U/L — ABNORMAL HIGH (ref 11–59)

## 2012-07-29 LAB — CLOSTRIDIUM DIFFICILE BY PCR: Toxigenic C. Difficile by PCR: NEGATIVE

## 2012-07-29 LAB — MAGNESIUM: Magnesium: 1.9 mg/dL (ref 1.5–2.5)

## 2012-07-29 LAB — BASIC METABOLIC PANEL
CO2: 25 mEq/L (ref 19–32)
Chloride: 107 mEq/L (ref 96–112)
Glucose, Bld: 81 mg/dL (ref 70–99)
Sodium: 138 mEq/L (ref 135–145)

## 2012-07-29 LAB — CBC
HCT: 28.8 % — ABNORMAL LOW (ref 36.0–46.0)
MCH: 24.8 pg — ABNORMAL LOW (ref 26.0–34.0)
MCV: 80.2 fL (ref 78.0–100.0)
RBC: 3.59 MIL/uL — ABNORMAL LOW (ref 3.87–5.11)
WBC: 8.1 10*3/uL (ref 4.0–10.5)

## 2012-07-29 MED ORDER — MYCOPHENOLATE MOFETIL 250 MG PO CAPS
1500.0000 mg | ORAL_CAPSULE | Freq: Two times a day (BID) | ORAL | Status: DC
  Administered 2012-07-29 – 2012-08-03 (×12): 1500 mg via ORAL
  Filled 2012-07-29 (×13): qty 6

## 2012-07-29 MED ORDER — HYDROMORPHONE HCL PF 2 MG/ML IJ SOLN
2.0000 mg | INTRAMUSCULAR | Status: DC | PRN
  Administered 2012-07-29 – 2012-07-30 (×4): 2 mg via INTRAVENOUS
  Filled 2012-07-29 (×5): qty 2

## 2012-07-29 MED ORDER — POTASSIUM CHLORIDE 10 MEQ/100ML IV SOLN
10.0000 meq | Freq: Once | INTRAVENOUS | Status: AC
  Administered 2012-07-29: 10 meq via INTRAVENOUS
  Filled 2012-07-29: qty 100

## 2012-07-29 MED ORDER — OXYCODONE HCL 5 MG PO TABS
30.0000 mg | ORAL_TABLET | Freq: Three times a day (TID) | ORAL | Status: DC
  Administered 2012-07-29 – 2012-08-03 (×15): 30 mg via ORAL
  Filled 2012-07-29 (×15): qty 6

## 2012-07-29 MED ORDER — CIPROFLOXACIN IN D5W 400 MG/200ML IV SOLN
400.0000 mg | Freq: Two times a day (BID) | INTRAVENOUS | Status: DC
  Administered 2012-07-29 – 2012-08-02 (×8): 400 mg via INTRAVENOUS
  Filled 2012-07-29 (×10): qty 200

## 2012-07-29 MED ORDER — ALUM & MAG HYDROXIDE-SIMETH 200-200-20 MG/5ML PO SUSP
30.0000 mL | Freq: Four times a day (QID) | ORAL | Status: DC | PRN
  Administered 2012-07-30 – 2012-07-31 (×2): 30 mL via ORAL
  Filled 2012-07-29: qty 30

## 2012-07-29 MED ORDER — IOHEXOL 300 MG/ML  SOLN
80.0000 mL | Freq: Once | INTRAMUSCULAR | Status: AC | PRN
  Administered 2012-07-29: 80 mL via INTRAVENOUS

## 2012-07-29 MED ORDER — OXYCODONE HCL 5 MG PO TABS
5.0000 mg | ORAL_TABLET | ORAL | Status: DC | PRN
  Administered 2012-07-29 – 2012-08-02 (×7): 5 mg via ORAL
  Filled 2012-07-29 (×7): qty 1

## 2012-07-29 MED ORDER — ENOXAPARIN SODIUM 40 MG/0.4ML ~~LOC~~ SOLN
40.0000 mg | SUBCUTANEOUS | Status: DC
  Administered 2012-07-29 – 2012-08-02 (×5): 40 mg via SUBCUTANEOUS
  Filled 2012-07-29 (×6): qty 0.4

## 2012-07-29 MED ORDER — FENTANYL 25 MCG/HR TD PT72
75.0000 ug | MEDICATED_PATCH | TRANSDERMAL | Status: DC
  Administered 2012-08-01: 75 ug via TRANSDERMAL
  Filled 2012-07-29 (×2): qty 3

## 2012-07-29 MED ORDER — MAGNESIUM SULFATE 50 % IJ SOLN: 1.0000 g | Freq: Once | INTRAMUSCULAR | Status: DC

## 2012-07-29 MED ORDER — HYDROMORPHONE HCL PF 1 MG/ML IJ SOLN
1.0000 mg | Freq: Once | INTRAMUSCULAR | Status: AC
  Administered 2012-07-29: 1 mg via INTRAVENOUS
  Filled 2012-07-29: qty 1

## 2012-07-29 MED ORDER — ENSURE COMPLETE PO LIQD
237.0000 mL | Freq: Every day | ORAL | Status: DC | PRN
  Filled 2012-07-29: qty 237

## 2012-07-29 MED ORDER — HYDROCORTISONE SOD SUCCINATE 100 MG IJ SOLR
50.0000 mg | Freq: Three times a day (TID) | INTRAMUSCULAR | Status: DC
  Administered 2012-07-29: 17:00:00 via INTRAVENOUS
  Administered 2012-07-29 – 2012-07-30 (×3): 50 mg via INTRAVENOUS
  Filled 2012-07-29 (×7): qty 1

## 2012-07-29 MED ORDER — MAGNESIUM SULFATE IN D5W 10-5 MG/ML-% IV SOLN
1.0000 g | Freq: Once | INTRAVENOUS | Status: AC
  Administered 2012-07-29: 1 g via INTRAVENOUS
  Filled 2012-07-29 (×2): qty 100

## 2012-07-29 MED ORDER — POTASSIUM CHLORIDE 10 MEQ/100ML IV SOLN
10.0000 meq | INTRAVENOUS | Status: AC
  Administered 2012-07-29 (×4): 10 meq via INTRAVENOUS
  Filled 2012-07-29 (×4): qty 100

## 2012-07-29 MED ORDER — POTASSIUM CHLORIDE CRYS ER 20 MEQ PO TBCR
40.0000 meq | EXTENDED_RELEASE_TABLET | Freq: Once | ORAL | Status: DC
  Filled 2012-07-29: qty 2

## 2012-07-29 MED ORDER — METRONIDAZOLE IN NACL 5-0.79 MG/ML-% IV SOLN
500.0000 mg | Freq: Three times a day (TID) | INTRAVENOUS | Status: DC
  Administered 2012-07-29 – 2012-08-02 (×12): 500 mg via INTRAVENOUS
  Filled 2012-07-29 (×13): qty 100

## 2012-07-29 MED ORDER — POTASSIUM CHLORIDE IN NACL 40-0.9 MEQ/L-% IV SOLN
INTRAVENOUS | Status: DC
  Administered 2012-07-30 – 2012-07-31 (×3): via INTRAVENOUS
  Filled 2012-07-29 (×7): qty 1000

## 2012-07-29 MED ORDER — ONDANSETRON HCL 4 MG/2ML IJ SOLN
4.0000 mg | Freq: Four times a day (QID) | INTRAMUSCULAR | Status: DC | PRN
  Administered 2012-07-29 – 2012-07-31 (×4): 4 mg via INTRAVENOUS
  Filled 2012-07-29 (×4): qty 2

## 2012-07-29 MED ORDER — IOHEXOL 300 MG/ML  SOLN
20.0000 mL | INTRAMUSCULAR | Status: AC
  Administered 2012-07-29 (×2): 20 mL via ORAL

## 2012-07-29 MED ORDER — POTASSIUM CHLORIDE IN NACL 40-0.9 MEQ/L-% IV SOLN
INTRAVENOUS | Status: DC
  Administered 2012-07-29: 04:00:00 via INTRAVENOUS
  Filled 2012-07-29: qty 1000

## 2012-07-29 MED ORDER — ONDANSETRON HCL 4 MG PO TABS
4.0000 mg | ORAL_TABLET | Freq: Four times a day (QID) | ORAL | Status: DC | PRN
  Administered 2012-07-30 (×2): 4 mg via ORAL
  Filled 2012-07-29 (×2): qty 1

## 2012-07-29 MED ORDER — PANTOPRAZOLE SODIUM 40 MG PO TBEC
40.0000 mg | DELAYED_RELEASE_TABLET | Freq: Every day | ORAL | Status: DC
  Administered 2012-07-29 – 2012-08-03 (×6): 40 mg via ORAL
  Filled 2012-07-29 (×6): qty 1

## 2012-07-29 MED ORDER — HYDROXYCHLOROQUINE SULFATE 200 MG PO TABS
200.0000 mg | ORAL_TABLET | Freq: Two times a day (BID) | ORAL | Status: DC
  Administered 2012-07-29 – 2012-08-03 (×12): 200 mg via ORAL
  Filled 2012-07-29 (×13): qty 1

## 2012-07-29 MED ORDER — SODIUM CHLORIDE 0.9 % IV BOLUS (SEPSIS)
1000.0000 mL | Freq: Once | INTRAVENOUS | Status: AC
  Administered 2012-07-29: 1000 mL via INTRAVENOUS

## 2012-07-29 MED ORDER — ENSURE COMPLETE PO LIQD
237.0000 mL | Freq: Two times a day (BID) | ORAL | Status: DC
  Administered 2012-07-30 – 2012-08-03 (×7): 237 mL via ORAL

## 2012-07-29 NOTE — H&P (Signed)
PCP:   Gwynneth Aliment, MD   Chief Complaint:  n/v  HPI: 36 yo female h/o sle was [facility] with presumed viral gastroenteritis sent home today.  Shortly after she got home she started having n/v again several episodes nonbloody.  No diarrhea.  No abd pain.  No cp no sob.  Not able to keep down any of her pain meds and so now her pain all over is worse.  Review of Systems:  O/w neg  Past Medical History: Past Medical History  Diagnosis Date  . Lupus   . Pulmonary embolism   . GERD (gastroesophageal reflux disease)   . Chronic pain    Past Surgical History  Procedure Date  . Pericardial window     in 2007    Medications: Prior to Admission medications   Medication Sig Start Date End Date Taking? Authorizing Provider  esomeprazole (NEXIUM) 20 MG capsule Take 20 mg by mouth daily before breakfast.    Yes Historical Provider, MD  fentaNYL (DURAGESIC - DOSED MCG/HR) 75 MCG/HR Place 1 patch onto the skin every 3 (three) days.    Yes Historical Provider, MD  hydroxychloroquine (PLAQUENIL) 200 MG tablet Take 200 mg by mouth 2 (two) times daily.    Yes Historical Provider, MD  mycophenolate (CELLCEPT) 500 MG tablet Take 1,500 mg by mouth 2 (two) times daily.    Yes Historical Provider, MD  oxycodone (ROXICODONE) 30 MG immediate release tablet Take 30 mg by mouth 3 (three) times daily.   Yes Historical Provider, MD  albuterol (PROVENTIL HFA;VENTOLIN HFA) 108 (90 BASE) MCG/ACT inhaler Inhale 2 puffs into the lungs every 4 (four) hours as needed. For shortness of breath 06/22/11 06/21/12  Vida Roller, MD  feeding supplement (ENSURE COMPLETE) LIQD Take 237 mLs by mouth daily as needed (per pt request, likes vanilla). 07/28/12   Leroy Sea, MD    Allergies:   Allergies  Allergen Reactions  . Imuran (Azathioprine) Other (See Comments)    Ears burned  . Tramadol Other (See Comments)    Burned ears    Social History:  reports that she has never  smoked. She does not have any smokeless tobacco history on file. She reports that she does not drink alcohol or use illicit drugs.  Family History: negative  Physical Exam: Filed Vitals:   07/28/12 1958 07/28/12 2325 07/29/12 0200  BP: 121/80 125/84 95/53  Pulse: 58 62 55  Temp: 97.8 F (36.6 C) 97.9 F (36.6 C)   TempSrc: Oral Oral   Resp: 18 18 18   SpO2: 100% 100% 100%   General appearance: alert, cooperative and no distress Neck: no JVD and supple, symmetrical, trachea midline Lungs: clear to auscultation bilaterally Heart: regular rate and rhythm, S1, S2 normal, no murmur, click, rub or gallop Abdomen: soft, non-tender; bowel sounds normal; no masses,  no organomegaly Extremities: extremities normal, atraumatic, no cyanosis or edema Pulses: 2+ and symmetric Skin: Skin color, texture, turgor normal. No rashes or lesions Neurologic: Grossly normal    Labs on Admission:   [facility] 07/28/12 2005 07/28/12 0550  NA 137 138  K 2.9* 3.8  CL 105 109  CO2 26 23  GLUCOSE 90 107*  BUN 6 7  CREATININE 0.64 0.54  CALCIUM 7.8* 7.3*  MG -- --  PHOS -- --    Basename 07/28/12 2005  AST 49*  ALT 17  ALKPHOS 42  BILITOT 0.3  PROT 7.3  ALBUMIN 2.7*    Basename  07/28/12 2005 07/27/12 0500  WBC 7.3 3.6*  NEUTROABS 4.7 --  HGB 9.9* 9.9*  HCT 32.1* 31.9*  MCV 81.3 81.6  PLT 305 280    Radiological Exams on Admission: None  Assessment/Plan  36 yo female h/o sle, chronic pain comes in with recurrent n/v Principal Problem:  *Nausea and vomiting Active Problems:  Chronic pain  History of pulmonary embolism  Hypokalemia  Lupus erythematosus  Replace k.  Ivf.  Zofran.  Iv dilaudid.  Ck lipase and cxr.  Pt improved in ED but does not feel ready to go home.abd exam is benign.  If n/v persists may need gi consult as this is second hospitalization for same.  Full code.    Samora Jernberg A 07/29/2012, 2:48 AM

## 2012-07-29 NOTE — Progress Notes (Signed)
CRITICAL VALUE ALERT  Critical value received:  K  Date of notification:  07-29-12  Time of notification:  0550  Critical value read back:yes  Nurse who received alert:  Sullivan Lone  MD notified (1st page):  Maren Reamer  Time of first page:  [phone]  MD notified (2nd page):  Time of second page:  Responding MD:  Maren Reamer  Time MD responded:  [phone]

## 2012-07-29 NOTE — ED Notes (Signed)
Pt transported to floor with tech

## 2012-07-29 NOTE — Progress Notes (Addendum)
Patient ID: Elizabeth Baxter  female  WJX:[id number]    DOB: May 19, 1976    DOA: 07/28/2012  PCP: Gwynneth Aliment, MD  Addendum:  CT abd and pelvis reviewed: acute diffuse colitis, gall stone, recommended Abd [place]. - Will start Ciprofloxacin and flagyl - abd [place] to asses for cholelithiasis/pancreatitis - May need to call GI if no improvement  ?ulcerative colitis vs pseudomembranous or infectious. Await Cdiff and stool cultures    Elizabeth Baxter M.D. [facility] 07/29/2012, 4:55 PM  Pager: [phone]       Assessment/Plan: Principal Problem:  *Pancreatitis, acute/ intractable nausea vomiting and diarrhea: Lipase 257 -  Placed on n.p.o. 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 - Ordered distal lateral, placed on IV hydrocortisone for stress dose steroids   Chronic pain: Continue pain control   History of pulmonary embolism: Recently completed xarelto   Hypokalemia - Potassium replaced aggressively today, replace magnesium 1.9 - Will recheck potassium at noon    Lupus erythematosus - Continue CellCept, Plaquenil and IV steroids  DVT Prophylaxis: Placed on Lovenox  Code Status: FC  Disposition:    Subjective: Still feels miserable, nausea and vomiting, diarrhea, unable to hold anything down  Objective: Weight change:   Intake/Output Summary (Last 24 hours) at 07/29/12 1131 Last data filed at 07/29/12 0600  Gross per 24 hour  Intake      0 ml  Output    200 ml  Net   -200 ml   Blood pressure 98/56, pulse 57, temperature 97.8 F (36.6 C), temperature source Oral, resp. rate 18, height 5\' 7"  (1.702 m), weight 90.266 kg (199 lb), last menstrual period 07/04/2012, SpO2  100.00%.  Physical Exam: General: Alert and awake, oriented x3, not in any acute distress. HEENT: anicteric sclera, pupils reactive to light and accommodation, EOMI CVS: S1-S2 clear, no murmur rubs or gallops Chest: clear to auscultation bilaterally, no wheezing, rales or rhonchi Abdomen: soft nontender, nondistended, normal bowel sounds, no organomegaly Extremities: no cyanosis, clubbing or edema noted bilaterally Neuro: Cranial nerves II-XII intact, no focal neurological deficits  Lab Results: Basic Metabolic Panel:  Lab 07/29/12 7846 07/29/12 0440 07/28/12 2005  NA -- 138 137  K -- 2.6* 2.9*  CL -- 107 105  CO2 -- 25 26  GLUCOSE -- 81 90  BUN -- 5* 6  CREATININE -- 0.65 0.64  CALCIUM -- 7.3* 7.8*  MG 1.9 -- --  PHOS -- -- --   Liver Function Tests:  Lab 07/28/12 2005 07/25/12 0546  AST 49* 42*  ALT 17 10  ALKPHOS 42 41  BILITOT 0.3 0.3  PROT 7.3 8.0  ALBUMIN 2.7* 2.9*    Lab 07/29/12 1000 07/28/12 2005  LIPASE 257* 181*  AMYLASE -- --   CBC:  Lab 07/29/12 0440 07/28/12 2005  WBC 8.1 7.3  NEUTROABS -- 4.7  HGB 8.9* 9.9*  HCT 28.8* 32.1*  MCV 80.2 81.3  PLT 284 305   Cardiac Enzymes: No results found for this basename: CKTOTAL:3,CKMB:3,CKMBINDEX:3,TROPONINI:3 in the last 168 hours BNP: No components found with this basename: POCBNP:2 CBG:  Lab 07/28/12 0825 07/27/12 2030 07/27/12 1709 07/27/12 1207 07/27/12 0805  GLUCAP 103* 104* 114* 111* 105*     Micro Results: Recent Results (from  the past 240 hour(s))  URINE CULTURE     Status: Normal   Collection Time   07/25/12  6:28 AM      Component Value Range Status Comment   Specimen Description URINE, RANDOM   Final    Special Requests ADDED 0709   Final    Culture  Setup Time 07/25/2012 16:04   Final    Colony Count 60,000 COLONIES/ML   Final    Culture     Final    Value: Multiple bacterial morphotypes present, none predominant. Suggest appropriate recollection if clinically indicated.   Report  Status 07/26/2012 FINAL   Final   CULTURE, BLOOD (ROUTINE X 2)     Status: Normal (Preliminary result)   Collection Time   07/25/12  7:55 AM      Component Value Range Status Comment   Specimen Description BLOOD LEFT ARM   Final    Special Requests BOTTLES DRAWN AEROBIC AND ANAEROBIC 10CC   Final    Culture  Setup Time 07/25/2012 16:06   Final    Culture     Final    Value:        BLOOD CULTURE RECEIVED NO GROWTH TO DATE CULTURE WILL BE HELD FOR 5 DAYS BEFORE ISSUING A FINAL NEGATIVE REPORT   Report Status PENDING   Incomplete   CULTURE, BLOOD (ROUTINE X 2)     Status: Normal (Preliminary result)   Collection Time   07/25/12  8:05 AM      Component Value Range Status Comment   Specimen Description BLOOD RIGHT HAND   Final    Special Requests BOTTLES DRAWN AEROBIC AND ANAEROBIC 10CC   Final    Culture  Setup Time 07/25/2012 16:06   Final    Culture     Final    Value:        BLOOD CULTURE RECEIVED NO GROWTH TO DATE CULTURE WILL BE HELD FOR 5 DAYS BEFORE ISSUING A FINAL NEGATIVE REPORT   Report Status PENDING   Incomplete   MRSA PCR SCREENING     Status: Normal   Collection Time   07/25/12  9:50 AM      Component Value Range Status Comment   MRSA by PCR NEGATIVE  NEGATIVE Final     Studies/Results: Ct Angio Chest Pe W/cm &/or Wo Cm  07/25/2012  *RADIOLOGY REPORT*  Clinical Data: Short of breath, fever, evaluate for recurrent pulmonary emboli  CT ANGIOGRAPHY CHEST  Technique:  Multidetector CT imaging of the chest using the standard protocol during bolus administration of intravenous contrast. Multiplanar reconstructed images including MIPs were obtained and reviewed to evaluate the vascular anatomy.  Contrast: 80mL OMNIPAQUE IOHEXOL 350 MG/ML SOLN  Comparison: Most recent CT PE study 04/01/2012  Findings:  Mediastinum: Unremarkable CT appearance of the thyroid gland. Nonspecific  mediastinal and predominately right hilar lymphadenopathy appears similar to prior. Index right hilar nodal  tissue on image 39 of series 4 measures 15 mm compared to 16 mm previously. Thoracic esophagus is diffusely and mildly patulous and contains frothy debris along its course.  There may be some mild circumferential thickening distally.  Heart/Vascular: Stable cardiomegaly.  No pericardial effusion. Adequate opacification of the pulmonary artery to the segmental branches.  Evaluation of the subsegmental arterial tree limited secondary to contrast bolus and motion artifact.  No central filling defect to suggest acute pulmonary embolus.  Conventional three-vessel aortic arch.  No acute dissection or aneurysmal dilatation.  Lungs/Pleura: Similar appearance of predominantly peripheral and basilar  distribution of subpleural reticulation, honeycombing, bronchiectasis bibasilar ground-glass attenuation and architectural distortion consistent with pulmonary fibrosis. No new focal airspace consolidation or areas of ground-glass opacification to suggest active inflammation. No pleural effusion or pneumothorax. No suspicious pulmonary nodules.  Upper Abdomen:  Limited imaging of the upper abdomen is unremarkable.  Bones: No acute fracture or aggressive appearing lytic or blastic osseous lesion.  IMPRESSION:  1.  Negative for acute pulmonary embolus or other acute cardiopulmonary process.  2.  Similar appearance of peripheral and basilar predominant pulmonary fibrosis and interstitial lung disease without significant interval progression compared to 04/01/2012.  3.  Diffusely patulous esophagus containing frothy debris with mild circumferential thickening distally.  This may be related to the patient's underlying autoimmune disorder or gastroesophageal reflux disease.  4.  Stable mediastinal, right hilar and axillary adenopathy.  5.  Stable cardiomegaly.   Original Report Authenticated By: Malachy Moan, M.D.    Dg Chest Port 1 View  07/25/2012  *RADIOLOGY REPORT*  Clinical Data: Chest pain  PORTABLE CHEST - 1 VIEW   Comparison: Chest radiograph 04/01/2012, CT thorax 04/01/2012  Findings: Stable enlarged heart silhouette.  There are bilateral pleural effusions and interstitial lung disease which is increased are increased prior.  Bibasilar atelectasis similar to prior.  No pneumothorax.  IMPRESSION:  1.  Stable cardiomegaly. 2.  Increase in bilateral pleural effusions and persistent bibasilar interstitial lung disease.   Original Report Authenticated By: Genevive Bi, M.D.     Medications: Scheduled Meds:   . fentaNYL  75 mcg Transdermal Q72H  . hydrocortisone sod succinate (SOLU-CORTEF) injection  50 mg Intravenous Q8H  . hydroxychloroquine  200 mg Oral BID  . magnesium sulfate  1 g Intravenous Once  . mycophenolate  1,500 mg Oral BID  . oxycodone  30 mg Oral TID  . pantoprazole  40 mg Oral Daily      LOS: 1 day   Elizabeth Baxter M.D. [facility] 07/29/2012, 11:31 AM Pager: [phone]  If 7PM-7AM, please contact night-coverage [email or web address] Password [email or web address]

## 2012-07-29 NOTE — Progress Notes (Signed)
INITIAL NUTRITION ASSESSMENT  DOCUMENTATION CODES Per approved criteria  -Obesity Class I    INTERVENTION: 1. Ensure Complete po daily PRN, each supplement provides 350 kcal and 13 grams of protein.  NUTRITION DIAGNOSIS: Inadequate oral intake related to GI distress as evidenced by persistent N/V.  Goal: Pt to meet >/= 90% of their estimated nutrition needs.  Monitor:  weight trends, lab trends, I/O's, PO intake, supplement tolerance  Reason for Assessment: Malnutrition Screening  36 y.o. female  Admitting Dx: Colitis, acute  ASSESSMENT: Admitted with pancreatitis, intractable nausea, vomiting and diarrhea. Pt is NPO on aggressive IVF, antiemetic and pain control. C.diff, stool cultures and CT are pending.  Pt was just discharged from [facility], was here for acute viral gastroenteritis.  Pt reports that after discharge she was tolerating diet at home until Sunday. Weight likely reflects fluid status. Per RN she will advance diet to full liquids for dinner.   Pt states that she drinks vanilla Boost at home.   Height: Ht Readings from Last 1 Encounters:  07/29/12 5\' 7"  (1.702 m)   Weight: 199 lb  Ideal Body Weight: 135 lb % Ideal Body Weight: 147%  Wt Readings from Last 10 Encounters:  07/29/12 199 lb (90.266 kg)  07/26/12 193 lb 5.5 oz (87.7 kg)  12/22/11 197 lb (89.359 kg)   Usual Body Weight:  185 lb - per pt % Usual Body Weight: 107%  BMI:  31.2 (using admit weight) - Obesity Class I   Estimated Nutritional Needs: Kcal: 1600 - 1800 kcal Protein: 80 - 90 grams Fluid: 1.6 - 1.8 liters daily  Skin: intact  Diet Order: NPO  EDUCATION NEEDS: -No education needs identified at this time   Intake/Output Summary (Last 24 hours) at 07/29/12 1651 Last data filed at 07/29/12 0600  Gross per 24 hour  Intake      0 ml  Output    200 ml  Net   -200 ml   Last BM: 12/12  Labs:   Lab 07/29/12 1427 07/29/12 1000 07/29/12 0440 07/28/12 2005 07/28/12 0550  NA -- --  138 137 138  K 3.4* -- 2.6* 2.9* --  CL -- -- 107 105 109  CO2 -- -- 25 26 23   BUN -- -- 5* 6 7  CREATININE -- -- 0.65 0.64 0.54  CALCIUM -- -- 7.3* 7.8* 7.3*  MG -- 1.9 -- -- --  PHOS -- -- -- -- --  GLUCOSE -- -- 81 90 107*    CBG (last 3)   Basename 07/28/12 0825 07/27/12 2030 07/27/12 1709  GLUCAP 103* 104* 114*    Scheduled Meds:    . ciprofloxacin  400 mg Intravenous Q12H  . enoxaparin (LOVENOX) injection  40 mg Subcutaneous Q24H  . fentaNYL  75 mcg Transdermal Q72H  . hydrocortisone sod succinate (SOLU-CORTEF) injection  50 mg Intravenous Q8H  . hydroxychloroquine  200 mg Oral BID  . magnesium sulfate 1 - 4 g bolus IVPB  1 g Intravenous Once  . metronidazole  500 mg Intravenous Q8H  . mycophenolate  1,500 mg Oral BID  . oxycodone  30 mg Oral TID  . pantoprazole  40 mg Oral Daily   Continuous Infusions:    . 0.9 % NaCl with KCl 40 mEq / L 125 mL/hr (07/29/12 1130)    Past Medical History  Diagnosis Date  . Lupus   . Pulmonary embolism   . GERD (gastroesophageal reflux disease)   . Chronic pain     Past  Surgical History  Procedure Date  . Pericardial window     in [address], [place], [facility] [phone] Pager [phone] After Hours Pager

## 2012-07-30 ENCOUNTER — Inpatient Hospital Stay (HOSPITAL_COMMUNITY)

## 2012-07-30 DIAGNOSIS — K5289 Other specified noninfective gastroenteritis and colitis: Principal | ICD-10-CM

## 2012-07-30 LAB — OVA AND PARASITE EXAMINATION: Ova and parasites: NONE SEEN

## 2012-07-30 LAB — GI PATHOGEN PANEL BY PCR, STOOL
C difficile toxin A/B: NEGATIVE
Campylobacter by PCR: NEGATIVE
E coli (ETEC) LT/ST: NEGATIVE
E coli (STEC): NEGATIVE

## 2012-07-30 LAB — FECAL LACTOFERRIN, QUANT: Fecal Lactoferrin: POSITIVE

## 2012-07-30 LAB — LIPASE, BLOOD: Lipase: 107 U/L — ABNORMAL HIGH (ref 11–59)

## 2012-07-30 LAB — BASIC METABOLIC PANEL
CO2: 25 mEq/L (ref 19–32)
Calcium: 7.5 mg/dL — ABNORMAL LOW (ref 8.4–10.5)
Chloride: 104 mEq/L (ref 96–112)
Sodium: 137 mEq/L (ref 135–145)

## 2012-07-30 MED ORDER — HYDROMORPHONE HCL PF 1 MG/ML IJ SOLN
2.0000 mg | INTRAMUSCULAR | Status: DC | PRN
  Administered 2012-07-30 – 2012-07-31 (×4): 2 mg via INTRAVENOUS
  Filled 2012-07-30 (×4): qty 2

## 2012-07-30 MED ORDER — POTASSIUM CHLORIDE CRYS ER 20 MEQ PO TBCR
40.0000 meq | EXTENDED_RELEASE_TABLET | Freq: Once | ORAL | Status: AC
  Administered 2012-07-30: 40 meq via ORAL
  Filled 2012-07-30: qty 2

## 2012-07-30 MED ORDER — HYDROCORTISONE SOD SUCCINATE 100 MG IJ SOLR
50.0000 mg | Freq: Two times a day (BID) | INTRAMUSCULAR | Status: DC
  Administered 2012-07-30 – 2012-08-01 (×4): 50 mg via INTRAVENOUS
  Filled 2012-07-30 (×6): qty 1

## 2012-07-30 NOTE — Consult Note (Signed)
Referring Provider: Dr. Isidoro Donning Primary Care Physician:  Gwynneth Aliment, MD Primary Gastroenterologist:  Gentry Fitz  Reason for Consultation:  Abdominal pain; Colitis; Diarrhea; Nausea and vomiting  HPI: Elizabeth Baxter is a 36 y.o. female with lupus on chronic steroids who had the acute onset of profuse vomiting and nausea and nonbloody diarrhea that started several hours after eating a [employer] cold cut sandwich. States nobody else got sick. Continues to have watery stool. No vomiting today and denies any current nausea. Lipase was elevated at 257 consistent with pancreatitis. CT scan showed diffuse colonic wall thickening c/w colitis. C. Diff negative. Additional stool studies pending. Feels ok right now and on full liquids.  Past Medical History  Diagnosis Date  . Lupus   . Pulmonary embolism   . GERD (gastroesophageal reflux disease)   . Chronic pain     Past Surgical History  Procedure Date  . Pericardial window     in 2007    Prior to Admission medications   Medication Sig Start Date End Date Taking? Authorizing Provider  esomeprazole (NEXIUM) 20 MG capsule Take 20 mg by mouth daily before breakfast.    Yes Historical Provider, MD  fentaNYL (DURAGESIC - DOSED MCG/HR) 75 MCG/HR Place 1 patch onto the skin every 3 (three) days.    Yes Historical Provider, MD  hydroxychloroquine (PLAQUENIL) 200 MG tablet Take 200 mg by mouth 2 (two) times daily.    Yes Historical Provider, MD  mycophenolate (CELLCEPT) 500 MG tablet Take 1,500 mg by mouth 2 (two) times daily.    Yes Historical Provider, MD  oxycodone (ROXICODONE) 30 MG immediate release tablet Take 30 mg by mouth 3 (three) times daily.   Yes Historical Provider, MD  albuterol (PROVENTIL HFA;VENTOLIN HFA) 108 (90 BASE) MCG/ACT inhaler Inhale 2 puffs into the lungs every 4 (four) hours as needed. For shortness of breath 06/22/11 06/21/12  Vida Roller, MD  feeding supplement (ENSURE COMPLETE) LIQD Take 237 mLs by mouth daily as needed  (per pt request, likes vanilla). 07/28/12   Leroy Sea, MD    Scheduled Meds:   . ciprofloxacin  400 mg Intravenous Q12H  . enoxaparin (LOVENOX) injection  40 mg Subcutaneous Q24H  . feeding supplement  237 mL Oral BID BM  . fentaNYL  75 mcg Transdermal Q72H  . hydrocortisone sod succinate (SOLU-CORTEF) injection  50 mg Intravenous Q8H  . hydroxychloroquine  200 mg Oral BID  . metronidazole  500 mg Intravenous Q8H  . mycophenolate  1,500 mg Oral BID  . oxycodone  30 mg Oral TID  . pantoprazole  40 mg Oral Daily   Continuous Infusions:   . 0.9 % NaCl with KCl 40 mEq / L 125 mL/hr at 07/30/12 0328   PRN Meds:.alum & mag hydroxide-simeth, HYDROmorphone (DILAUDID) injection, ondansetron (ZOFRAN) IV, ondansetron, oxyCODONE  Allergies as of 07/28/2012 - Review Complete 07/28/2012  Allergen Reaction Noted  . Imuran (azathioprine) Other (See Comments) 12/22/2011  . Tramadol Other (See Comments) 06/21/2011    No family history on file.  History   Social History  . Marital Status: Single    Spouse Name: N/A    Number of Children: N/A  . Years of Education: N/A   Occupational History  . Not on file.   Social History Main Topics  . Smoking status: Never Smoker   . Smokeless tobacco: Not on file  . Alcohol Use: No  . Drug Use: No     Comment: no IV Drug use in past  .  Sexually Active: Yes   Other Topics Concern  . Not on file   Social History Narrative   Lives at home with husband and 78 yo daughter    Review of Systems: All negative except as stated above in HPI.  Physical Exam: Vital signs: Filed Vitals:   07/30/12 0555  BP: 109/74  Pulse: 68  Temp: 98 F (36.7 C)  Resp: 18   Last BM Date: 07/29/12 General:   Alert,  Well-developed, well-nourished, pleasant and cooperative in NAD Lungs:  Clear throughout to auscultation.   No wheezes, crackles, or rhonchi. No acute distress. Heart:  Regular rate and rhythm; no murmurs, clicks, rubs,  or  gallops. Abdomen: diffusely tender with guarding, soft, nondistended, +BS  Rectal:  Deferred  GI:  Lab Results:  Basename 07/29/12 0440 07/28/12 2005  WBC 8.1 7.3  HGB 8.9* 9.9*  HCT 28.8* 32.1*  PLT 284 305   BMET  Basename 07/30/12 0450 07/29/12 1427 07/29/12 0440 07/28/12 2005  NA 137 -- 138 137  K 3.4* 3.4* 2.6* --  CL 104 -- 107 105  CO2 25 -- 25 26  GLUCOSE 86 -- 81 90  BUN 5* -- 5* 6  CREATININE 0.65 -- 0.65 0.64  CALCIUM 7.5* -- 7.3* 7.8*   LFT  Basename 07/28/12 2005  PROT 7.3  ALBUMIN 2.7*  AST 49*  ALT 17  ALKPHOS 42  BILITOT 0.3  BILIDIR --  IBILI --   PT/INR No results found for this basename: LABPROT:2,INR:2 in the last 72 hours   Studies/Results: US Abdomen Complete  07/30/2012  *RADIOLOGY REPORT*  Clinical Data:  Evaluate gallbladder for stones.  ABDOMINAL ULTRASOUND COMPLETE  Comparison:  CT abdomen yesterday  Findings: Small left pleural effusion is noted.  Gallbladder:  There are no gallstones.  Sludge is present.  There is significant gallbladder wall thickening measuring nearly 1 cm.  Common Bile Duct:  Within normal limits in caliber.  Liver: No focal mass lesion identified.  Within normal limits in parenchymal echogenicity.  IVC:  Appears normal.  Pancreas:  No abnormality identified.  Spleen:  Within normal limits in size and echotexture.  Right kidney:  Normal in size and parenchymal echogenicity.  No evidence of mass or hydronephrosis.  Left kidney:  Normal in size and parenchymal echogenicity.  No evidence of mass or hydronephrosis.  Abdominal Aorta:  No aneurysm identified.  IMPRESSION: No evidence of gallstones.  The appearance on CT reflects wall thickening and inflammatory change of the gallbladder wall without stones.   Original Report Authenticated By: Jolaine Click, M.D.    Ct Abdomen Pelvis W Contrast  07/29/2012  *RADIOLOGY REPORT*  Clinical Data: Vomiting  CT ABDOMEN AND PELVIS WITH CONTRAST  Technique:  Multidetector CT imaging of  the abdomen and pelvis was performed following the standard protocol during bolus administration of intravenous contrast.  Contrast: 80mL OMNIPAQUE IOHEXOL 300 MG/ML  SOLN  Comparison: None.  Findings: Small bilateral pleural effusions have developed. Basilar pulmonary fibrosis again noted. Esophageal wall thickening is again identified in the lower thorax.  The gallbladder has an unusual appearance with a lamellated appearance.  This may represent unusual enhancement of the wall or a gallstone.  There is wall thickening and inflammatory change involving nearly all of the colon in a continuous fashion beginning in the mid ascending colon, throughout its course and involving the rectosigmoid.  There is no pneumatosis.  There is stranding in the adjacent fat.  No extraluminal bowel gas.  Liver, spleen, pancreas, adrenal  glands, kidneys are within normal limits.  1.5 cm of left para-aortic lymph node conglomerate.  Several small para-aortic nodes are noted.  Normal appendix.  Terminal ileum is within normal limits.  Small amount of free fluid adjacent to the tip of the liver, left paracolic gutter, and in the pelvis.  Uterus and right adnexa are within normal limits.  The left ovary is enlarged with cystic changes.  Underlying cystic mass is not excluded.  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  IMPRESSION: There is wall thickening of nearly the entire colon in a continuous fashion.  Differential diagnosis includes pseudomembranous colitis, ulcerative colitis, infection.  Mild neoplasm and malignancy are less likely.  Bilateral pleural effusions.  Abnormal wall thickening of the gallbladder versus gallstone. Ultrasound is recommended.  Borderline left periaortic retroperitoneal adenopathy.  Enlarged left ovary with cystic changes.  Underlying cystic mass is not excluded.  Pelvic ultrasound is recommended to further characterize.  Prominent L4-5 disc herniation.   Original Report Authenticated By:  Jolaine Click, M.D.     Impression/Plan: 36 yo with N/V/D that is likely due to viral gastroenteritis from food. History not consistent with pseudomembranous colitis or ulcerative colitis. Would continue supportive care and await stool studies. If stool studies negative and symptoms have resolved, then would not recommend a colonoscopy/flex sig but if symptoms persisting despite negative stool studies then may need a flex. Dr. Dulce Sellar available to see this weekend if needed otherwise will sign off and call if questions. D/W plan with Dr. Isidoro Donning.    LOS: 2 days   Maribel Hadley C.  07/30/2012, 9:49 AM

## 2012-07-30 NOTE — Progress Notes (Signed)
Patient ID: Elizabeth Baxter  female  EAV:[id number]    DOB: November 08, 1975    DOA: 07/28/2012  PCP: Gwynneth Aliment, MD    Assessment/Plan: Principal Problem:  Acute diffuse colitis: Likely infectious, per patient, her symptoms had actually started after eating [employer] sandwiches daily because of $2 deal going on currently and her husband had similar symptoms - Continue full liquid diet, patient does admit want to advance to solids yet - Stool cultures still pending. C. 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 - Taper IV hydrocortisone, once the stable, can be transitioned to oral prednisone per her home dose   Chronic pain: Continue pain control   History of pulmonary embolism: Recently completed xarelto   Hypokalemia - Replaced   Lupus erythematosus - Continue CellCept, Plaquenil and IV steroids  DVT Prophylaxis: on Lovenox  Code Status: FC  Disposition:    Subjective: Nausea and vomiting improving, abdominal pain improving  Objective: Weight change:  No intake or output data in the 24 hours ending 07/30/12 1220 Blood pressure 109/74, pulse 68, temperature 98 F (36.7 C), temperature source Oral, resp. rate 18, height 5\' 7"  (1.702 m), weight 90.266 kg (199 lb), last menstrual period 07/04/2012, SpO2 94.00%.  Physical Exam: General: Alert and awake, oriented x3, NAD. HEENT: anicteric sclera, pupils reactive to light and accommodation, EOMI CVS: S1-S2 clear, no murmur rubs or gallops Chest: CTAB no wheezing, rales or rhonchi Abdomen: soft, mildly tender in the lower quadrants, normal bowel sounds, no organomegaly Extremities: no cyanosis, clubbing or edema noted bilaterally  Lab Results: Basic Metabolic Panel:  Lab 07/30/12 7829  07/29/12 1427 07/29/12 1000 07/29/12 0440  NA 137 -- -- 138  K 3.4* 3.4* -- --  CL 104 -- -- 107  CO2 25 -- -- 25  GLUCOSE 86 -- -- 81  BUN 5* -- -- 5*  CREATININE 0.65 -- -- 0.65  CALCIUM 7.5* -- -- 7.3*  MG -- -- 1.9 --  PHOS -- -- -- --   Liver Function Tests:  Lab 07/28/12 2005 07/25/12 0546  AST 49* 42*  ALT 17 10  ALKPHOS 42 41  BILITOT 0.3 0.3  PROT 7.3 8.0  ALBUMIN 2.7* 2.9*    Lab 07/30/12 0450 07/29/12 1000  LIPASE 107* 257*  AMYLASE -- --   CBC:  Lab 07/29/12 0440 07/28/12 2005  WBC 8.1 7.3  NEUTROABS -- 4.7  HGB 8.9* 9.9*  HCT 28.8* 32.1*  MCV 80.2 81.3  PLT 284 305   Cardiac Enzymes: No results found for this basename: CKTOTAL:3,CKMB:3,CKMBINDEX:3,TROPONINI:3 in the last 168 hours BNP: No components found with this basename: POCBNP:2 CBG:  Lab 07/28/12 0825 07/27/12 2030 07/27/12 1709 07/27/12 1207 07/27/12 0805  GLUCAP 103* 104* 114* 111* 105*     Micro Results: Recent Results (from the past 240 hour(s))  URINE CULTURE     Status: Normal   Collection Time   07/25/12  6:28 AM      Component Value Range Status Comment   Specimen Description URINE, RANDOM   Final    Special Requests ADDED 0709   Final    Culture  Setup Time 07/25/2012 16:04   Final    Colony Count 60,000 COLONIES/ML   Final    Culture     Final    Value: Multiple bacterial  morphotypes present, none predominant. Suggest appropriate recollection if clinically indicated.   Report Status 07/26/2012 FINAL   Final   CULTURE, BLOOD (ROUTINE X 2)     Status: Normal (Preliminary result)   Collection Time   07/25/12  7:55 AM      Component Value Range Status Comment   Specimen Description BLOOD LEFT ARM   Final    Special Requests BOTTLES DRAWN AEROBIC AND ANAEROBIC 10CC   Final    Culture  Setup Time 07/25/2012 16:06   Final    Culture     Final    Value:        BLOOD CULTURE RECEIVED NO GROWTH TO DATE CULTURE WILL BE HELD FOR 5 DAYS BEFORE ISSUING A FINAL NEGATIVE REPORT    Report Status PENDING   Incomplete   CULTURE, BLOOD (ROUTINE X 2)     Status: Normal (Preliminary result)   Collection Time   07/25/12  8:05 AM      Component Value Range Status Comment   Specimen Description BLOOD RIGHT HAND   Final    Special Requests BOTTLES DRAWN AEROBIC AND ANAEROBIC 10CC   Final    Culture  Setup Time 07/25/2012 16:06   Final    Culture     Final    Value:        BLOOD CULTURE RECEIVED NO GROWTH TO DATE CULTURE WILL BE HELD FOR 5 DAYS BEFORE ISSUING A FINAL NEGATIVE REPORT   Report Status PENDING   Incomplete   MRSA PCR SCREENING     Status: Normal   Collection Time   07/25/12  9:50 AM      Component Value Range Status Comment   MRSA by PCR NEGATIVE  NEGATIVE Final   CLOSTRIDIUM DIFFICILE BY PCR     Status: Normal   Collection Time   07/29/12  3:44 PM      Component Value Range Status Comment   C difficile by pcr NEGATIVE  NEGATIVE Final     Studies/Results: Ct Angio Chest Pe W/cm &/or Wo Cm  07/25/2012  *RADIOLOGY REPORT*  Clinical Data: Short of breath, fever, evaluate for recurrent pulmonary emboli  CT ANGIOGRAPHY CHEST  Technique:  Multidetector CT imaging of the chest using the standard protocol during bolus administration of intravenous contrast. Multiplanar reconstructed images including MIPs were obtained and reviewed to evaluate the vascular anatomy.  Contrast: 80mL OMNIPAQUE IOHEXOL 350 MG/ML SOLN  Comparison: Most recent CT PE study 04/01/2012  Findings:  Mediastinum: Unremarkable CT appearance of the thyroid gland. Nonspecific  mediastinal and predominately right hilar lymphadenopathy appears similar to prior. Index right hilar nodal tissue on image 39 of series 4 measures 15 mm compared to 16 mm previously. Thoracic esophagus is diffusely and mildly patulous and contains frothy debris along its course.  There may be some mild circumferential thickening distally.  Heart/Vascular: Stable cardiomegaly.  No pericardial effusion. Adequate opacification of the  pulmonary artery to the segmental branches.  Evaluation of the subsegmental arterial tree limited secondary to contrast bolus and motion artifact.  No central filling defect to suggest acute pulmonary embolus.  Conventional three-vessel aortic arch.  No acute dissection or aneurysmal dilatation.  Lungs/Pleura: Similar appearance of predominantly peripheral and basilar distribution of subpleural reticulation, honeycombing, bronchiectasis bibasilar ground-glass attenuation and architectural distortion consistent with pulmonary fibrosis. No new focal airspace consolidation or areas of ground-glass opacification to suggest active inflammation. No pleural effusion or pneumothorax. No suspicious pulmonary nodules.  Upper Abdomen:  Limited imaging of the  upper abdomen is unremarkable.  Bones: No acute fracture or aggressive appearing lytic or blastic osseous lesion.  IMPRESSION:  1.  Negative for acute pulmonary embolus or other acute cardiopulmonary process.  2.  Similar appearance of peripheral and basilar predominant pulmonary fibrosis and interstitial lung disease without significant interval progression compared to 04/01/2012.  3.  Diffusely patulous esophagus containing frothy debris with mild circumferential thickening distally.  This may be related to the patient's underlying autoimmune disorder or gastroesophageal reflux disease.  4.  Stable mediastinal, right hilar and axillary adenopathy.  5.  Stable cardiomegaly.   Original Report Authenticated By: Malachy Moan, M.D.    Dg Chest Port 1 View  07/25/2012  *RADIOLOGY REPORT*  Clinical Data: Chest pain  PORTABLE CHEST - 1 VIEW  Comparison: Chest radiograph 04/01/2012, CT thorax 04/01/2012  Findings: Stable enlarged heart silhouette.  There are bilateral pleural effusions and interstitial lung disease which is increased are increased prior.  Bibasilar atelectasis similar to prior.  No pneumothorax.  IMPRESSION:  1.  Stable cardiomegaly. 2.  Increase in  bilateral pleural effusions and persistent bibasilar interstitial lung disease.   Original Report Authenticated By: Genevive Bi, M.D.     Medications: Scheduled Meds:    . ciprofloxacin  400 mg Intravenous Q12H  . enoxaparin (LOVENOX) injection  40 mg Subcutaneous Q24H  . feeding supplement  237 mL Oral BID BM  . fentaNYL  75 mcg Transdermal Q72H  . hydrocortisone sod succinate (SOLU-CORTEF) injection  50 mg Intravenous Q8H  . hydroxychloroquine  200 mg Oral BID  . metronidazole  500 mg Intravenous Q8H  . mycophenolate  1,500 mg Oral BID  . oxycodone  30 mg Oral TID  . pantoprazole  40 mg Oral Daily      LOS: 2 days   RAI,RIPUDEEP M.D. [facility] 07/30/2012, 12:20 PM Pager: [phone]  If 7PM-7AM, please contact night-coverage [email or web address] Password [email or web address]

## 2012-07-31 DIAGNOSIS — D649 Anemia, unspecified: Secondary | ICD-10-CM

## 2012-07-31 LAB — BASIC METABOLIC PANEL
CO2: 24 mEq/L (ref 19–32)
Calcium: 8.2 mg/dL — ABNORMAL LOW (ref 8.4–10.5)
GFR calc non Af Amer: >90 mL/min (ref >90)
Sodium: 140 mEq/L (ref 135–145)

## 2012-07-31 LAB — CULTURE, BLOOD (ROUTINE X 2): Culture: NO GROWTH

## 2012-07-31 MED ORDER — HYDROMORPHONE HCL PF 1 MG/ML IJ SOLN
1.0000 mg | INTRAMUSCULAR | Status: DC | PRN
  Administered 2012-07-31 – 2012-08-02 (×7): 1 mg via INTRAVENOUS
  Filled 2012-07-31 (×9): qty 1

## 2012-07-31 MED ORDER — ACETAMINOPHEN 325 MG PO TABS
650.0000 mg | ORAL_TABLET | Freq: Four times a day (QID) | ORAL | Status: DC | PRN
  Administered 2012-08-03: 650 mg via ORAL
  Filled 2012-07-31: qty 2

## 2012-07-31 NOTE — Progress Notes (Signed)
[facility] PROGRESS NOTE  OLINE BELK ZOX:[id number] DOB: [birth year] DOA: 07/28/2012 PCP: Gwynneth Aliment, MD  Assessment/Plan: 1. Acute diffuse colitis--presumed infectious. No further evaluation per GI unless fails to improve. History not consistent with pseudomembranous colitis or ulcerative colitis. If stool studies negative and symptoms have resolved, then would not recommend a colonoscopy/flex sig but if symptoms persisting despite negative stool studies then may need a flex.  2. Elevated lipase--symptoms not suggestive of pancreatitis, no abnormalities seen on CT and U/s. No further evaluation. Asymptomatic. 3. Minimally elevated AST--follow-up as outpatient.  4. Chronic normocytic anemia--stable.  5. Chronic pain--stable. 6. Thickening and inflammatory change of the gallbladder wall without stones--significance unclear, asymptomatic, follow-up as outpatient with surgery. 7. Enlarged left ovary with cystic changes--Underlying cystic mass is not excluded. Consider pelvic ultrasound as outpatient to further characterize.  8. Lupus--stable. Continue CellCept, Plaquenil and IV steroids 9. History of pulmonary embolism--Recently completed xarelto 10. Hypotension--resolved, cortisol unremarkable. Likely secondary to GI losses.  Code Status: Full code Family Communication: none present Disposition Plan: home when improved, likely 12/22  Brendia Sacks, MD  [facility] Team 4  Pager [phone] If 7PM-7AM, please contact night-coverage at [email or web address], password [facility] 07/31/2012, 12:53 PM  LOS: 3 days   Brief narrative: 36 yo female h/o sle was just [facility] today [facility] with presumed viral gastroenteritis sent home today. Shortly after she got home she started having n/v again several episodes nonbloody after eating a sandwich at [employer]. No diarrhea. No abd pain. No cp no sob. Not able to keep down any of her pain meds and so now her pain all over is  worse.  Consultants:  [facility]  Procedures:  None  Antibiotics:  Cipro 12/19 >>  Flagyl 12/19 >>  HPI/Subjective: Feels better, no n/v, minimal lower abdominal pain, no epigastric/RUQ pain. Eating food without difficulty. Still with multiple episodes of diarrhea.  Objective: Filed Vitals:   07/30/12 1337 07/30/12 1758 07/30/12 2200 07/31/12 0600  BP: 112/78 135/85 141/90 101/63  Pulse: 56 106 53 63  Temp: 97.3 F (36.3 C) 97.6 F (36.4 C) 97.7 F (36.5 C) 98 F (36.7 C)  TempSrc: Oral Oral    Resp: 16 18 18 18   Height:      Weight:      SpO2: 56% 99% 98% 96%    Intake/Output Summary (Last 24 hours) at 07/31/12 1253 Last data filed at 07/31/12 0900  Gross per 24 hour  Intake    360 ml  Output      0 ml  Net    360 ml   Filed Weights   07/29/12 0326  Weight: 90.266 kg (199 lb)    Exam:  General:  Appears calm and comfortable Cardiovascular: RRR, no m/r/g. No LE edema. Respiratory: CTA bilaterally, no w/r/r. Normal respiratory effort. Abdomen: soft, positive bowel sounds, ntnd Psychiatric: grossly normal mood and affect, speech fluent and appropriate  Data Reviewed: Basic Metabolic Panel:  Lab 07/31/12 8295 07/30/12 0450 07/29/12 1427 07/29/12 1000 07/29/12 0440 07/28/12 2005 07/28/12 0550  NA 140 137 -- -- 138 137 138  K 3.5 3.4* 3.4* -- 2.6* 2.9* --  CL 104 104 -- -- 107 105 109  CO2 24 25 -- -- 25 26 23   GLUCOSE 101* 86 -- -- 81 90 107*  BUN 4* 5* -- -- 5* 6 7  CREATININE 0.59 0.65 -- -- 0.65 0.64 0.54  CALCIUM 8.2* 7.5* -- -- 7.3* 7.8* 7.3*  MG -- -- -- 1.9 -- -- --  PHOS -- -- -- -- -- -- --   Liver Function Tests:  Lab 07/28/12 2005 07/25/12 0546  AST 49* 42*  ALT 17 10  ALKPHOS 42 41  BILITOT 0.3 0.3  PROT 7.3 8.0  ALBUMIN 2.7* 2.9*    Lab 07/30/12 0450 07/29/12 1000 07/28/12 2005 07/25/12 1758  LIPASE 107* 257* 181* 36  AMYLASE -- -- -- --   CBC:  Lab 07/29/12 0440 07/28/12 2005 07/27/12 0500 07/26/12 0958 07/25/12 0546   WBC 8.1 7.3 3.6* 3.2* 7.1  NEUTROABS -- 4.7 -- -- 5.1  HGB 8.9* 9.9* 9.9* 10.2* 11.3*  HCT 28.8* 32.1* 31.9* 33.7* 36.7  MCV 80.2 81.3 81.6 80.8 81.0  PLT 284 305 280 248 281   CBG:  Lab 07/28/12 0825 07/27/12 2030 07/27/12 1709 07/27/12 1207 07/27/12 0805  GLUCAP 103* 104* 114* 111* 105*    Recent Results (from the past 240 hour(s))  URINE CULTURE     Status: Normal   Collection Time   07/25/12  6:28 AM      Component Value Range Status Comment   Specimen Description URINE, RANDOM   Final    Special Requests ADDED 0709   Final    Culture  Setup Time 07/25/2012 16:04   Final    Colony Count 60,000 COLONIES/ML   Final    Culture     Final    Value: Multiple bacterial morphotypes present, none predominant. Suggest appropriate recollection if clinically indicated.   Report Status 07/26/2012 FINAL   Final   CULTURE, BLOOD (ROUTINE X 2)     Status: Normal (Preliminary result)   Collection Time   07/25/12  7:55 AM      Component Value Range Status Comment   Specimen Description BLOOD LEFT ARM   Final    Special Requests BOTTLES DRAWN AEROBIC AND ANAEROBIC 10CC   Final    Culture  Setup Time 07/25/2012 16:06   Final    Culture     Final    Value:        BLOOD CULTURE RECEIVED NO GROWTH TO DATE CULTURE WILL BE HELD FOR 5 DAYS BEFORE ISSUING A FINAL NEGATIVE REPORT   Report Status PENDING   Incomplete   CULTURE, BLOOD (ROUTINE X 2)     Status: Normal (Preliminary result)   Collection Time   07/25/12  8:05 AM      Component Value Range Status Comment   Specimen Description BLOOD RIGHT HAND   Final    Special Requests BOTTLES DRAWN AEROBIC AND ANAEROBIC 10CC   Final    Culture  Setup Time 07/25/2012 16:06   Final    Culture     Final    Value:        BLOOD CULTURE RECEIVED NO GROWTH TO DATE CULTURE WILL BE HELD FOR 5 DAYS BEFORE ISSUING A FINAL NEGATIVE REPORT   Report Status PENDING   Incomplete   MRSA PCR SCREENING     Status: Normal   Collection Time   07/25/12  9:50 AM       Component Value Range Status Comment   MRSA by PCR NEGATIVE  NEGATIVE Final   OVA AND PARASITE EXAMINATION     Status: Normal   Collection Time   07/29/12  3:44 PM      Component Value Range Status Comment   Specimen Description STOOL   Final    Special Requests NONE   Final    Ova and parasites NO OVA OR PARASITES SEEN  Final    Report Status 07/30/2012 FINAL   Final   CLOSTRIDIUM DIFFICILE BY PCR     Status: Normal   Collection Time   07/29/12  3:44 PM      Component Value Range Status Comment   C difficile by pcr NEGATIVE  NEGATIVE Final      Studies: US Abdomen Complete  07/30/2012  *RADIOLOGY REPORT*  Clinical Data:  Evaluate gallbladder for stones.  ABDOMINAL ULTRASOUND COMPLETE  Comparison:  CT abdomen yesterday  Findings: Small left pleural effusion is noted.  Gallbladder:  There are no gallstones.  Sludge is present.  There is significant gallbladder wall thickening measuring nearly 1 cm.  Common Bile Duct:  Within normal limits in caliber.  Liver: No focal mass lesion identified.  Within normal limits in parenchymal echogenicity.  IVC:  Appears normal.  Pancreas:  No abnormality identified.  Spleen:  Within normal limits in size and echotexture.  Right kidney:  Normal in size and parenchymal echogenicity.  No evidence of mass or hydronephrosis.  Left kidney:  Normal in size and parenchymal echogenicity.  No evidence of mass or hydronephrosis.  Abdominal Aorta:  No aneurysm identified.  IMPRESSION: No evidence of gallstones.  The appearance on CT reflects wall thickening and inflammatory change of the gallbladder wall without stones.   Original Report Authenticated By: Jolaine Click, M.D.    Ct Abdomen Pelvis W Contrast  07/29/2012  *RADIOLOGY REPORT*  Clinical Data: Vomiting  CT ABDOMEN AND PELVIS WITH CONTRAST  Technique:  Multidetector CT imaging of the abdomen and pelvis was performed following the standard protocol during bolus administration of intravenous contrast.  Contrast:  80mL OMNIPAQUE IOHEXOL 300 MG/ML  SOLN  Comparison: None.  Findings: Small bilateral pleural effusions have developed. Basilar pulmonary fibrosis again noted. Esophageal wall thickening is again identified in the lower thorax.  The gallbladder has an unusual appearance with a lamellated appearance.  This may represent unusual enhancement of the wall or a gallstone.  There is wall thickening and inflammatory change involving nearly all of the colon in a continuous fashion beginning in the mid ascending colon, throughout its course and involving the rectosigmoid.  There is no pneumatosis.  There is stranding in the adjacent fat.  No extraluminal bowel gas.  Liver, spleen, pancreas, adrenal glands, kidneys are within normal limits.  1.5 cm of left para-aortic lymph node conglomerate.  Several small para-aortic nodes are noted.  Normal appendix.  Terminal ileum is within normal limits.  Small amount of free fluid adjacent to the tip of the liver, left paracolic gutter, and in the pelvis.  Uterus and right adnexa are within normal limits.  The left ovary is enlarged with cystic changes.  Underlying cystic mass is not excluded.  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  IMPRESSION: There is wall thickening of nearly the entire colon in a continuous fashion.  Differential diagnosis includes pseudomembranous colitis, ulcerative colitis, infection.  Mild neoplasm and malignancy are less likely.  Bilateral pleural effusions.  Abnormal wall thickening of the gallbladder versus gallstone. Ultrasound is recommended.  Borderline left periaortic retroperitoneal adenopathy.  Enlarged left ovary with cystic changes.  Underlying cystic mass is not excluded.  Pelvic ultrasound is recommended to further characterize.  Prominent L4-5 disc herniation.   Original Report Authenticated By: Jolaine Click, M.D.     Scheduled Meds:   . ciprofloxacin  400 mg Intravenous Q12H  . enoxaparin (LOVENOX) injection  40 mg  Subcutaneous Q24H  . feeding supplement  237 mL Oral BID BM  . fentaNYL  75 mcg Transdermal Q72H  . hydrocortisone sod succinate (SOLU-CORTEF) injection  50 mg Intravenous Q12H  . hydroxychloroquine  200 mg Oral BID  . metronidazole  500 mg Intravenous Q8H  . mycophenolate  1,500 mg Oral BID  . oxycodone  30 mg Oral TID  . pantoprazole  40 mg Oral Daily   Continuous Infusions:   . 0.9 % NaCl with KCl 40 mEq / L 100 mL/hr at 07/31/12 0431    Principal Problem:  *Colitis, acute Active Problems:  Nausea and vomiting  Chronic pain  History of pulmonary embolism  Hypokalemia  Lupus erythematosus  Pancreatitis, acute     Brendia Sacks, MD  [facility] Team 4  Pager [phone] If 7PM-7AM, please contact night-coverage at [email or web address], password [facility] 07/31/2012, 12:53 PM  LOS: 3 days   Time spent: 25 minutes

## 2012-08-01 LAB — COMPREHENSIVE METABOLIC PANEL
ALT: 14 U/L (ref 0–35)
AST: 33 U/L (ref 0–37)
Albumin: 2.9 g/dL — ABNORMAL LOW (ref 3.5–5.2)
Alkaline Phosphatase: 43 U/L (ref 39–117)
Glucose, Bld: 97 mg/dL (ref 70–99)
Potassium: 2.8 mEq/L — ABNORMAL LOW (ref 3.5–5.1)
Sodium: 137 mEq/L (ref 135–145)
Total Protein: 7.6 g/dL (ref 6.0–8.3)

## 2012-08-01 LAB — CBC
HCT: 33.1 % — ABNORMAL LOW (ref 36.0–46.0)
Hemoglobin: 10.1 g/dL — ABNORMAL LOW (ref 12.0–15.0)
MCH: 24.5 pg — ABNORMAL LOW (ref 26.0–34.0)
MCHC: 30.5 g/dL (ref 30.0–36.0)
MCV: 80.1 fL (ref 78.0–100.0)

## 2012-08-01 MED ORDER — SODIUM CHLORIDE 0.9 % IV SOLN
INTRAVENOUS | Status: DC
  Administered 2012-08-01: 150 mL/h via INTRAVENOUS
  Administered 2012-08-01 – 2012-08-02 (×2): via INTRAVENOUS
  Administered 2012-08-03: 1000 mL via INTRAVENOUS

## 2012-08-01 MED ORDER — POTASSIUM CHLORIDE CRYS ER 20 MEQ PO TBCR
40.0000 meq | EXTENDED_RELEASE_TABLET | Freq: Three times a day (TID) | ORAL | Status: AC
  Administered 2012-08-01 (×3): 40 meq via ORAL
  Filled 2012-08-01 (×3): qty 2

## 2012-08-01 NOTE — Progress Notes (Signed)
Patient c/o dizziness when standing. Orthostatic vitals done. Lying 100/57, sitting 98/58, standing 73/42. Dr. Irene Limbo informed of these vitals and that patients potassium level from this AM was only 2.8. NS at 150 and PO KCl ordered. Will continue to monitor.

## 2012-08-01 NOTE — Progress Notes (Signed)
[facility] PROGRESS NOTE  Elizabeth Baxter ZOX:[id number] DOB: June 27, 1976 DOA: 07/28/2012 PCP: Gwynneth Aliment, MD  Assessment/Plan: 1. Acute diffuse colitis--presumed infectious. No further evaluation per GI. History not consistent with pseudomembranous colitis or ulcerative colitis.  2. Elevated lipase--symptoms not suggestive of pancreatitis, no abnormalities seen on CT and U/s. No further evaluation.  3. Chronic normocytic anemia--stable.  4. Chronic pain--stable. 5. Thickening and inflammatory change of the gallbladder wall without stones--significance unclear, asymptomatic, follow-up as outpatient with surgery. 6. Enlarged left ovary with cystic changes--Underlying cystic mass is not excluded. Consider pelvic ultrasound as outpatient to further characterize.  7. Lupus--stable. Continue CellCept, Plaquenil, stop steroids 8. History of pulmonary embolism--Recently completed xarelto 9. Orthostatic hypotension--resolved, cortisol unremarkable. Likely secondary to GI losses. IVF.  Code Status: Full code Family Communication: none present Disposition Plan: home when improved, likely 12/22  Brendia Sacks, MD  [facility] Team 4  Pager [phone] If 7PM-7AM, please contact night-coverage at [email or web address], password [email or web address] 08/01/2012, 5:55 PM  LOS: 4 days   Brief narrative: 36 yo female h/o sle was just d/c today [facility] with presumed viral gastroenteritis sent home today. Shortly after she got home she started having n/v again several episodes nonbloody after eating a sandwich at [place]. No diarrhea. No abd pain. No cp no sob. Not able to keep down any of her pain meds and so now her pain all over is worse.  Consultants:  [facility]  Procedures:  None  Antibiotics:  Cipro 12/19 >>  Flagyl 12/19 >>  HPI/Subjective: Feels better, no n/v/abdominal pain. Eating well. Was dizzy this am when ambulating. Orthostatics were grossly positive.  Objective: Filed  Vitals:   08/01/12 1037 08/01/12 1039 08/01/12 1040 08/01/12 1753  BP: 100/57 98/58 73/42  113/67  Pulse: 70 71 100 56  Temp:    97.4 F (36.3 C)  TempSrc:    Oral  Resp:    18  Height:      Weight:      SpO2:    99%   No intake or output data in the 24 hours ending 08/01/12 1755 Filed Weights   07/29/12 0326  Weight: 90.266 kg (199 lb)    Exam:  General:  Appears calm and comfortable Cardiovascular: RRR, no m/r/g. No LE edema. Respiratory: CTA bilaterally, no w/r/r. Normal respiratory effort. Abdomen: soft, ntnd Psychiatric: grossly normal mood and affect, speech fluent and appropriate  Exam current 12/22  Data Reviewed: Basic Metabolic Panel:  Lab 08/01/12 8295 07/31/12 0500 07/30/12 0450 07/29/12 1427 07/29/12 1000 07/29/12 0440 07/28/12 2005  NA 137 140 137 -- -- 138 137  K 2.8* 3.5 3.4* 3.4* -- 2.6* --  CL 99 104 104 -- -- 107 105  CO2 27 24 25  -- -- 25 26  GLUCOSE 97 101* 86 -- -- 81 90  BUN 4* 4* 5* -- -- 5* 6  CREATININE 0.63 0.59 0.65 -- -- 0.65 0.64  CALCIUM 8.5 8.2* 7.5* -- -- 7.3* 7.8*  MG -- -- -- -- 1.9 -- --  PHOS -- -- -- -- -- -- --   Liver Function Tests:  Lab 08/01/12 0625 07/28/12 2005  AST 33 49*  ALT 14 17  ALKPHOS 43 42  BILITOT 0.3 0.3  PROT 7.6 7.3  ALBUMIN 2.9* 2.7*    Lab 08/01/12 0625 07/30/12 0450 07/29/12 1000 07/28/12 2005 07/25/12 1758  LIPASE 138* 107* 257* 181* 36  AMYLASE -- -- -- -- --   CBC:  Lab 08/01/12 0625 07/29/12 0440  07/28/12 2005 07/27/12 0500 07/26/12 0958  WBC 6.8 8.1 7.3 3.6* 3.2*  NEUTROABS -- -- 4.7 -- --  HGB 10.1* 8.9* 9.9* 9.9* 10.2*  HCT 33.1* 28.8* 32.1* 31.9* 33.7*  MCV 80.1 80.2 81.3 81.6 80.8  PLT 365 284 305 280 248   CBG:  Lab 07/28/12 0825 07/27/12 2030 07/27/12 1709 07/27/12 1207 07/27/12 0805  GLUCAP 103* 104* 114* 111* 105*    Recent Results (from the past 240 hour(s))  URINE CULTURE     Status: Normal   Collection Time   07/25/12  6:28 AM      Component Value Range Status  Comment   Specimen Description URINE, RANDOM   Final    Special Requests ADDED 0709   Final    Culture  Setup Time 07/25/2012 16:04   Final    Colony Count 60,000 COLONIES/ML   Final    Culture     Final    Value: Multiple bacterial morphotypes present, none predominant. Suggest appropriate recollection if clinically indicated.   Report Status 07/26/2012 FINAL   Final   CULTURE, BLOOD (ROUTINE X 2)     Status: Normal   Collection Time   07/25/12  7:55 AM      Component Value Range Status Comment   Specimen Description BLOOD LEFT ARM   Final    Special Requests BOTTLES DRAWN AEROBIC AND ANAEROBIC 10CC   Final    Culture  Setup Time 07/25/2012 16:06   Final    Culture NO GROWTH 5 DAYS   Final    Report Status 07/31/2012 FINAL   Final   CULTURE, BLOOD (ROUTINE X 2)     Status: Normal   Collection Time   07/25/12  8:05 AM      Component Value Range Status Comment   Specimen Description BLOOD RIGHT HAND   Final    Special Requests BOTTLES DRAWN AEROBIC AND ANAEROBIC 10CC   Final    Culture  Setup Time 07/25/2012 16:06   Final    Culture NO GROWTH 5 DAYS   Final    Report Status 07/31/2012 FINAL   Final   MRSA PCR SCREENING     Status: Normal   Collection Time   07/25/12  9:50 AM      Component Value Range Status Comment   MRSA by PCR NEGATIVE  NEGATIVE Final   STOOL CULTURE     Status: Normal (Preliminary result)   Collection Time   07/29/12  3:44 PM      Component Value Range Status Comment   Specimen Description STOOL   Final    Special Requests NONE   Final    Culture     Final    Value: NO SUSPICIOUS COLONIES, CONTINUING TO HOLD     Note: REDUCED NORMAL FLORA PRESENT   Report Status PENDING   Incomplete   OVA AND PARASITE EXAMINATION     Status: Normal   Collection Time   07/29/12  3:44 PM      Component Value Range Status Comment   Specimen Description STOOL   Final    Special Requests NONE   Final    Ova and parasites NO OVA OR PARASITES SEEN   Final    Report Status  07/30/2012 FINAL   Final   CLOSTRIDIUM DIFFICILE BY PCR     Status: Normal   Collection Time   07/29/12  3:44 PM      Component Value Range Status Comment   C difficile  by pcr NEGATIVE  NEGATIVE Final      Studies: No results found.  Scheduled Meds:    . ciprofloxacin  400 mg Intravenous Q12H  . enoxaparin (LOVENOX) injection  40 mg Subcutaneous Q24H  . feeding supplement  237 mL Oral BID BM  . fentaNYL  75 mcg Transdermal Q72H  . hydrocortisone sod succinate (SOLU-CORTEF) injection  50 mg Intravenous Q12H  . hydroxychloroquine  200 mg Oral BID  . metronidazole  500 mg Intravenous Q8H  . mycophenolate  1,500 mg Oral BID  . oxycodone  30 mg Oral TID  . pantoprazole  40 mg Oral Daily  . potassium chloride  40 mEq Oral TID   Continuous Infusions:    . sodium chloride 150 mL/hr (08/01/12 1320)    Principal Problem:  *Colitis, acute Active Problems:  Nausea and vomiting  Chronic pain  History of pulmonary embolism  Hypokalemia  Lupus erythematosus  Pancreatitis, acute  Normocytic anemia     Brendia Sacks, MD  [facility] Team 4  Pager [phone] If 7PM-7AM, please contact night-coverage at [email or web address], password [facility] 08/01/2012, 5:55 PM  LOS: 4 days   Time spent: 15 minutes

## 2012-08-02 DIAGNOSIS — I951 Orthostatic hypotension: Secondary | ICD-10-CM

## 2012-08-02 DIAGNOSIS — R109 Unspecified abdominal pain: Secondary | ICD-10-CM

## 2012-08-02 LAB — COMPREHENSIVE METABOLIC PANEL
BUN: 4 mg/dL — ABNORMAL LOW (ref 6–23)
Calcium: 7.8 mg/dL — ABNORMAL LOW (ref 8.4–10.5)
GFR calc Af Amer: >90 mL/min (ref >90)
GFR calc non Af Amer: >90 mL/min (ref >90)
Glucose, Bld: 82 mg/dL (ref 70–99)
Total Protein: 6.5 g/dL (ref 6.0–8.3)

## 2012-08-02 LAB — STOOL CULTURE

## 2012-08-02 LAB — LIPASE, BLOOD: Lipase: 169 U/L — ABNORMAL HIGH (ref 11–59)

## 2012-08-02 MED ORDER — METRONIDAZOLE 500 MG PO TABS
500.0000 mg | ORAL_TABLET | Freq: Four times a day (QID) | ORAL | Status: DC
  Filled 2012-08-02 (×4): qty 1

## 2012-08-02 MED ORDER — METRONIDAZOLE 500 MG PO TABS
500.0000 mg | ORAL_TABLET | Freq: Four times a day (QID) | ORAL | Status: DC
  Administered 2012-08-02 – 2012-08-03 (×4): 500 mg via ORAL
  Filled 2012-08-02 (×7): qty 1

## 2012-08-02 MED ORDER — HYDROMORPHONE HCL PF 1 MG/ML IJ SOLN
1.5000 mg | INTRAMUSCULAR | Status: DC | PRN
  Administered 2012-08-02 – 2012-08-03 (×5): 1.5 mg via INTRAVENOUS
  Filled 2012-08-02 (×5): qty 2

## 2012-08-02 MED ORDER — CIPROFLOXACIN HCL 500 MG PO TABS
500.0000 mg | ORAL_TABLET | Freq: Two times a day (BID) | ORAL | Status: DC
  Administered 2012-08-02 – 2012-08-03 (×2): 500 mg via ORAL
  Filled 2012-08-02 (×4): qty 1

## 2012-08-02 MED ORDER — PREDNISONE 20 MG PO TABS
40.0000 mg | ORAL_TABLET | Freq: Every day | ORAL | Status: DC
  Administered 2012-08-02 – 2012-08-03 (×2): 40 mg via ORAL
  Filled 2012-08-02 (×3): qty 2

## 2012-08-02 MED ORDER — PREDNISONE 20 MG PO TABS
20.0000 mg | ORAL_TABLET | Freq: Every day | ORAL | Status: DC
  Filled 2012-08-02 (×2): qty 1

## 2012-08-02 MED ORDER — HYDROMORPHONE HCL PF 1 MG/ML IJ SOLN
1.0000 mg | INTRAMUSCULAR | Status: DC | PRN
  Administered 2012-08-02 (×2): 1 mg via INTRAVENOUS
  Filled 2012-08-02 (×2): qty 1

## 2012-08-02 NOTE — Progress Notes (Signed)
NUTRITION FOLLOW UP  Intervention:   Continue Ensure Complete po BID, each supplement provides 350 kcal and 13 grams of protein.  NUTRITION DIAGNOSIS:  Inadequate oral intake related to GI distress as evidenced by persistent N/V; ongoing.   Goal:  Pt to meet >/= 90% of their estimated nutrition needs; met.  Monitor:  weight trends, lab trends, I/O's, PO intake, supplement tolerance  Assessment:   Pt with diffuse colitis, presumed infectious. Per pt she is feeling better from last week. She is eating more than 1/2 of her meal and she likes the ensure and is consistently drinking at least one per day.   Height: Ht Readings from Last 1 Encounters:  07/29/12 5\' 7"  (1.702 m)    Weight Status:   Wt Readings from Last 1 Encounters:  07/29/12 199 lb (90.266 kg)    Estimated Nutritional Needs:  Kcal: 1600 - 1800 kcal  Protein: 80 - 90 grams  Fluid: 1.6 - 1.8 liters daily  Skin: no issues noted  Diet Order: General Per pt meal completion > 50%   Intake/Output Summary (Last 24 hours) at 08/02/12 1345 Last data filed at 08/02/12 0700  Gross per 24 hour  Intake    360 ml  Output      0 ml  Net    360 ml    Last BM: 12/21   Labs:   Lab 08/02/12 0742 08/01/12 0625 07/31/12 0500 07/29/12 1000  NA 136 137 140 --  K 3.5 2.8* 3.5 --  CL 102 99 104 --  CO2 27 27 24  --  BUN 4* 4* 4* --  CREATININE 0.72 0.63 0.59 --  CALCIUM 7.8* 8.5 8.2* --  MG -- -- -- 1.9  PHOS -- -- -- --  GLUCOSE 82 97 101* --    CBG (last 3)  No results found for this basename: GLUCAP:3 in the last 72 hours  Scheduled Meds:   . ciprofloxacin  500 mg Oral BID  . enoxaparin (LOVENOX) injection  40 mg Subcutaneous Q24H  . feeding supplement  237 mL Oral BID BM  . fentaNYL  75 mcg Transdermal Q72H  . hydroxychloroquine  200 mg Oral BID  . metroNIDAZOLE  500 mg Oral Q6H  . mycophenolate  1,500 mg Oral BID  . oxycodone  30 mg Oral TID  . pantoprazole  40 mg Oral Daily  . predniSONE  40 mg Oral  Q breakfast    Continuous Infusions:   . sodium chloride 150 mL/hr at 08/02/12 4098    [facility] RD, LDN, CNSC [phone] Pager [phone] After Hours Pager

## 2012-08-02 NOTE — Consult Note (Signed)
Agree with A&P of JD,NP. Her abdomen is soft and not tender and I don't think she has a surgical indication at this time

## 2012-08-02 NOTE — Consult Note (Signed)
Reason for Consult:abdominal pain Referring Physician: [name] is an 36 y.o. female.  HPI: 36 yo female h/o sle was just d/c today from [facility] with presumed viral gastroenteritis sent home today. Shortly after she got home she started having n/v again several episodes nonbloody. No diarrhea. No abd pain. No cp no sob. Not able to keep down any of her pain meds and so now her pain all over is worse.  CT of the abdomen and pelvis was done on 07/29/12 Results: The gallbladder has an unusual appearance with a lamellated  appearance. This may represent unusual enhancement of the wall or  a gallstone.  There is wall thickening and inflammatory change involving nearly  all of the colon in a continuous fashion beginning in the mid  ascending colon, throughout its course and involving the  rectosigmoid. There is no pneumatosis. There is stranding in the  adjacent fat. No extraluminal bowel gas. US of the abdomen: IMPRESSION: done 07/30/12 No evidence of gallstones. The appearance on CT reflects wall  thickening and inflammatory change of the gallbladder wall without  stones. We are asked to see the patient on consult for further evaluation and recommendation concerning her abdominal pain. Patient's exam was benign, no tenderness or guarding to deep plapation, tolerating regular diet w/o exacerbation of her presenting c/o. Her AST is only slightly elevated, other LFT's are wnl. T bili is 0.3 Patient's affect is flat, w/o eye contact, she will only give short answers to questions. Appears to be in NAD at time of exam.   Past Medical History  Diagnosis Date  . Lupus   . Pulmonary embolism   . GERD (gastroesophageal reflux disease)   . Chronic pain     Past Surgical History  Procedure Date  . Pericardial window     in 2007    No family history on file.  Social History:  reports that she has never smoked. She does not have any smokeless tobacco history on file. She  reports that she does not drink alcohol or use illicit drugs.  Allergies:  Allergies  Allergen Reactions  . Imuran (Azathioprine) Other (See Comments)    Ears burned  . Tramadol Other (See Comments)    Burned ears    Medications: I have reviewed the patient's current medications.  Results for orders placed during the hospital encounter of 07/28/12 (from the past 48 hour(s))  COMPREHENSIVE METABOLIC PANEL     Status: Abnormal   Collection Time   08/01/12  6:25 AM      Component Value Range Comment   Sodium 137  135 - 145 mEq/L    Potassium 2.8 (*) 3.5 - 5.1 mEq/L    Chloride 99  96 - 112 mEq/L    CO2 27  19 - 32 mEq/L    Glucose, Bld 97  70 - 99 mg/dL    BUN 4 (*) 6 - 23 mg/dL    Creatinine, Ser 1.61  0.50 - 1.10 mg/dL    Calcium 8.5  8.4 - 09.6 mg/dL    Total Protein 7.6  6.0 - 8.3 g/dL    Albumin 2.9 (*) 3.5 - 5.2 g/dL    AST 33  0 - 37 U/L    ALT 14  0 - 35 U/L    Alkaline Phosphatase 43  39 - 117 U/L    Total Bilirubin 0.3  0.3 - 1.2 mg/dL    GFR calc non Af Amer >90  >90 mL/min  GFR calc Af Amer >90  >90 mL/min   LIPASE, BLOOD     Status: Abnormal   Collection Time   08/01/12  6:25 AM      Component Value Range Comment   Lipase 138 (*) 11 - 59 U/L   CBC     Status: Abnormal   Collection Time   08/01/12  6:25 AM      Component Value Range Comment   WBC 6.8  4.0 - 10.5 K/uL    RBC 4.13  3.87 - 5.11 MIL/uL    Hemoglobin 10.1 (*) 12.0 - 15.0 g/dL    HCT 40.9 (*) 81.1 - 46.0 %    MCV 80.1  78.0 - 100.0 fL    MCH 24.5 (*) 26.0 - 34.0 pg    MCHC 30.5  30.0 - 36.0 g/dL    RDW 91.4  78.2 - 95.6 %    Platelets 365  150 - 400 K/uL   COMPREHENSIVE METABOLIC PANEL     Status: Abnormal   Collection Time   08/02/12  7:42 AM      Component Value Range Comment   Sodium 136  135 - 145 mEq/L    Potassium 3.5  3.5 - 5.1 mEq/L    Chloride 102  96 - 112 mEq/L    CO2 27  19 - 32 mEq/L    Glucose, Bld 82  70 - 99 mg/dL    BUN 4 (*) 6 - 23 mg/dL    Creatinine, Ser 2.13   0.50 - 1.10 mg/dL    Calcium 7.8 (*) 8.4 - 10.5 mg/dL    Total Protein 6.5  6.0 - 8.3 g/dL    Albumin 2.6 (*) 3.5 - 5.2 g/dL    AST 45 (*) 0 - 37 U/L    ALT 18  0 - 35 U/L    Alkaline Phosphatase 50  39 - 117 U/L    Total Bilirubin 0.3  0.3 - 1.2 mg/dL    GFR calc non Af Amer >90  >90 mL/min    GFR calc Af Amer >90  >90 mL/min   LIPASE, BLOOD     Status: Abnormal   Collection Time   08/02/12  7:42 AM      Component Value Range Comment   Lipase 169 (*) 11 - 59 U/L     No results found.  Review of Systems  Constitutional: Negative.   HENT: Negative.   Eyes: Negative.   Respiratory: Negative.   Cardiovascular: Negative.   Skin: Negative.    Blood pressure 96/52, pulse 72, temperature 98.8 F (37.1 C), temperature source Oral, resp. rate 17, height 5\' 7"  (1.702 m), weight 199 lb (90.266 kg), last menstrual period 07/04/2012, SpO2 97.00%. Physical Exam  Assessment/Plan: Patient Active Problem List  Diagnosis  . Dehydration  . Nausea and vomiting  . Hypotension  . SLE exacerbation  . Chronic pain  . GERD (gastroesophageal reflux disease)  . History of pulmonary embolism  . Hypokalemia  . Lupus erythematosus  . Pancreatitis, acute  . Colitis, acute  . Normocytic anemia  . Orthostatic hypotension  Her abdominal pain appears to be most likely related to her acute colitis which appears to be rapidly resolving vs any gallbladder involvement, at this time.  Agree with continuing IVF, ABX It does not appear that she has a surgical issue at this time, given her physical exam, labs and progress since admission; however I will discuss this case with Dr. Jamey Ripa as to a final  surgical opinion.  Thank you for this consult   Elizabeth Baxter [facility] Pager # [phone] 08/02/2012, 1:58 PM

## 2012-08-02 NOTE — Progress Notes (Signed)
[facility] PROGRESS NOTE  Elizabeth Baxter JXB:[id number] DOB: October 01, 1975 DOA: 07/28/2012 PCP: Gwynneth Aliment, MD  Assessment/Plan: 1. Acute diffuse colitis--presumed infectious. No further evaluation per GI. No diarrhea today. Improving. Continue Cipro/Flagyl, change to oral. History not consistent with pseudomembranous colitis or ulcerative colitis.  2. Orthostatic hypotension--cortisol unremarkable. Patient reports she is chronically on prednisone 20 mg daily (not on MAR). This is likely etiology of orthostasis. Will start double dose today, continue IVF. Recheck orthostatics in AM. 3. Elevated lipase--completely asymptomatic. Symptoms not suggestive of pancreatitis, no abnormalities seen on CT and U/s. Significance unclear. Monitor. 4. Chronic normocytic anemia--stable.  5. Chronic pain--worse secondary to Lupus per patient. Adjust pain medication. 6. Thickening and inflammatory change of the gallbladder wall without stones--significance unclear, asymptomatic, no gallstones, asymptomatic, etiology and significance unclear, does not appear to be connected to other acute issues. Surgery evaluation requested for recommendations. 7. Enlarged left ovary with cystic changes--Underlying cystic mass is not excluded. Consider pelvic ultrasound as outpatient to further characterize.  8. Lupus--stable. Continue CellCept, Plaquenil, resume prednisone. 9. History of pulmonary embolism--Recently completed xarelto  Code Status: Full code Family Communication: none present Disposition Plan: home when improved, possibly 12/24.  Brendia Sacks, MD  [facility] Team 4  Pager [phone] If 7PM-7AM, please contact night-coverage at [email or web address], password [facility] 08/02/2012, 12:08 PM  LOS: 5 days   Brief narrative: 36 yo female h/o sle was just d/c today from [facility] with presumed viral gastroenteritis sent home today. Shortly after she got home she started having n/v again several episodes  nonbloody after eating a sandwich at [place]. No diarrhea. No abd pain. No cp no sob. Not able to keep down any of her pain meds and so now her pain all over is worse.  Consultants:  [facility]  Procedures:  None  Antibiotics:  Cipro 12/19 >>  Flagyl 12/19 >>  HPI/Subjective: Feels poorly, complains of whole body muscle aches and pain. However, eating well. No n/v, no abdominal pain, specifically no RUQ abdominal pain. Still orthostatic.  Objective: Filed Vitals:   08/01/12 1040 08/01/12 1753 08/01/12 2233 08/02/12 0539  BP: 73/42 113/67 88/54 96/52   Pulse: 100 56 80 72  Temp:  97.4 F (36.3 C) 98 F (36.7 C) 98.8 F (37.1 C)  TempSrc:  Oral Oral Oral  Resp:  18 16 17   Height:      Weight:      SpO2:  99% 100% 97%    Intake/Output Summary (Last 24 hours) at 08/02/12 1208 Last data filed at 08/02/12 0700  Gross per 24 hour  Intake    360 ml  Output      0 ml  Net    360 ml   Filed Weights   07/29/12 0326  Weight: 90.266 kg (199 lb)    Exam:  General:  Appears calm and mildly uncomfortable Cardiovascular: RRR, no m/r/g. No LE edema. Respiratory: CTA bilaterally, no w/r/r. Normal respiratory effort. Abdomen: soft, ntnd, no RUQ tenderness Psychiatric: grossly normal mood and affect, speech fluent and appropriate  Data Reviewed: Basic Metabolic Panel:  Lab 08/02/12 8469 08/01/12 0625 07/31/12 0500 07/30/12 0450 07/29/12 1427 07/29/12 1000 07/29/12 0440  NA 136 137 140 137 -- -- 138  K 3.5 2.8* 3.5 3.4* 3.4* -- --  CL 102 99 104 104 -- -- 107  CO2 27 27 24 25  -- -- 25  GLUCOSE 82 97 101* 86 -- -- 81  BUN 4* 4* 4* 5* -- -- 5*  CREATININE 0.72  0.63 0.59 0.65 -- -- 0.65  CALCIUM 7.8* 8.5 8.2* 7.5* -- -- 7.3*  MG -- -- -- -- -- 1.9 --  PHOS -- -- -- -- -- -- --   Liver Function Tests:  Lab 08/02/12 0742 08/01/12 0625 07/28/12 2005  AST 45* 33 49*  ALT 18 14 17   ALKPHOS 50 43 42  BILITOT 0.3 0.3 0.3  PROT 6.5 7.6 7.3  ALBUMIN 2.6* 2.9* 2.7*    Lab  08/02/12 0742 08/01/12 0625 07/30/12 0450 07/29/12 1000 07/28/12 2005  LIPASE 169* 138* 107* 257* 181*  AMYLASE -- -- -- -- --   CBC:  Lab 08/01/12 0625 07/29/12 0440 07/28/12 2005 07/27/12 0500  WBC 6.8 8.1 7.3 3.6*  NEUTROABS -- -- 4.7 --  HGB 10.1* 8.9* 9.9* 9.9*  HCT 33.1* 28.8* 32.1* 31.9*  MCV 80.1 80.2 81.3 81.6  PLT 365 284 305 280   CBG:  Lab 07/28/12 0825 07/27/12 2030 07/27/12 1709 07/27/12 1207 07/27/12 0805  GLUCAP 103* 104* 114* 111* 105*    Recent Results (from the past 240 hour(s))  URINE CULTURE     Status: Normal   Collection Time   07/25/12  6:28 AM      Component Value Range Status Comment   Specimen Description URINE, RANDOM   Final    Special Requests ADDED 0709   Final    Culture  Setup Time 07/25/2012 16:04   Final    Colony Count 60,000 COLONIES/ML   Final    Culture     Final    Value: Multiple bacterial morphotypes present, none predominant. Suggest appropriate recollection if clinically indicated.   Report Status 07/26/2012 FINAL   Final   CULTURE, BLOOD (ROUTINE X 2)     Status: Normal   Collection Time   07/25/12  7:55 AM      Component Value Range Status Comment   Specimen Description BLOOD LEFT ARM   Final    Special Requests BOTTLES DRAWN AEROBIC AND ANAEROBIC 10CC   Final    Culture  Setup Time 07/25/2012 16:06   Final    Culture NO GROWTH 5 DAYS   Final    Report Status 07/31/2012 FINAL   Final   CULTURE, BLOOD (ROUTINE X 2)     Status: Normal   Collection Time   07/25/12  8:05 AM      Component Value Range Status Comment   Specimen Description BLOOD RIGHT HAND   Final    Special Requests BOTTLES DRAWN AEROBIC AND ANAEROBIC 10CC   Final    Culture  Setup Time 07/25/2012 16:06   Final    Culture NO GROWTH 5 DAYS   Final    Report Status 07/31/2012 FINAL   Final   MRSA PCR SCREENING     Status: Normal   Collection Time   07/25/12  9:50 AM      Component Value Range Status Comment   MRSA by PCR NEGATIVE  NEGATIVE Final   STOOL  CULTURE     Status: Normal (Preliminary result)   Collection Time   07/29/12  3:44 PM      Component Value Range Status Comment   Specimen Description STOOL   Final    Special Requests NONE   Final    Culture     Final    Value: NO SUSPICIOUS COLONIES, CONTINUING TO HOLD     Note: REDUCED NORMAL FLORA PRESENT   Report Status PENDING   Incomplete   OVA AND  PARASITE EXAMINATION     Status: Normal   Collection Time   07/29/12  3:44 PM      Component Value Range Status Comment   Specimen Description STOOL   Final    Special Requests NONE   Final    Ova and parasites NO OVA OR PARASITES SEEN   Final    Report Status 07/30/2012 FINAL   Final   CLOSTRIDIUM DIFFICILE BY PCR     Status: Normal   Collection Time   07/29/12  3:44 PM      Component Value Range Status Comment   C difficile by pcr NEGATIVE  NEGATIVE Final      Studies: No results found.  Scheduled Meds:    . ciprofloxacin  400 mg Intravenous Q12H  . enoxaparin (LOVENOX) injection  40 mg Subcutaneous Q24H  . feeding supplement  237 mL Oral BID BM  . fentaNYL  75 mcg Transdermal Q72H  . hydroxychloroquine  200 mg Oral BID  . metronidazole  500 mg Intravenous Q8H  . mycophenolate  1,500 mg Oral BID  . oxycodone  30 mg Oral TID  . pantoprazole  40 mg Oral Daily  . predniSONE  40 mg Oral Q breakfast   Continuous Infusions:    . sodium chloride 150 mL/hr at 08/02/12 2440    Principal Problem:  *Colitis, acute Active Problems:  Nausea and vomiting  Chronic pain  History of pulmonary embolism  Hypokalemia  Lupus erythematosus  Pancreatitis, acute  Normocytic anemia     Brendia Sacks, MD  [facility] Team 4  Pager [phone] If 7PM-7AM, please contact night-coverage at [email or web address], password [facility] 08/02/2012, 12:08 PM  LOS: 5 days   Time spent: 20 minutes

## 2012-08-03 DIAGNOSIS — M329 Systemic lupus erythematosus, unspecified: Secondary | ICD-10-CM

## 2012-08-03 LAB — LIPASE, BLOOD: Lipase: 119 U/L — ABNORMAL HIGH (ref 11–59)

## 2012-08-03 MED ORDER — CIPROFLOXACIN HCL 500 MG PO TABS: 500.0000 mg | ORAL_TABLET | Freq: Two times a day (BID) | ORAL | Status: DC

## 2012-08-03 MED ORDER — SACCHAROMYCES BOULARDII 250 MG PO CAPS: 250.0000 mg | ORAL_CAPSULE | Freq: Two times a day (BID) | ORAL | Status: DC

## 2012-08-03 MED ORDER — METRONIDAZOLE 500 MG PO TABS: 500.0000 mg | ORAL_TABLET | Freq: Four times a day (QID) | ORAL | Status: DC

## 2012-08-03 MED ORDER — PROMETHAZINE HCL 12.5 MG PO TABS: 12.5000 mg | ORAL_TABLET | Freq: Four times a day (QID) | ORAL | Status: DC | PRN

## 2012-08-03 NOTE — Discharge Summary (Signed)
Physician Discharge Summary  Patient ID: Elizabeth Baxter MRN: [id number] DOB/AGE: Dec 12, 1975 36 y.o.  Admit date: 07/28/2012 Discharge date: 08/03/2012  Primary Care Physician:  Gwynneth Aliment, MD  Discharge Diagnoses:    . Nausea and vomiting . Chronic pain . Hypokalemia . Lupus erythematosus with mild flare and hypotension  . Pancreatitis, acute . Colitis, acute  Consults:  Gastroenterology, Dr. Bosie Clos                     General surgery, Dr. Jamey Ripa   Discharge Medications:   Medication List     As of 08/03/2012 10:50 AM    TAKE these medications         albuterol 108 (90 BASE) MCG/ACT inhaler   Commonly known as: PROVENTIL HFA;VENTOLIN HFA   Inhale 2 puffs into the lungs every 4 (four) hours as needed. For shortness of breath      CELLCEPT 500 MG tablet   Generic drug: mycophenolate   Take 1,500 mg by mouth 2 (two) times daily.      ciprofloxacin 500 MG tablet   Commonly known as: CIPRO   Take 1 tablet (500 mg total) by mouth 2 (two) times daily. X 8 more days      esomeprazole 20 MG capsule   Commonly known as: NEXIUM   Take 20 mg by mouth daily before breakfast.      feeding supplement Liqd   Take 237 mLs by mouth daily as needed (per pt request, likes vanilla).      fentaNYL 75 MCG/HR   Commonly known as: DURAGESIC - dosed mcg/hr   Place 1 patch onto the skin every 3 (three) days.      hydroxychloroquine 200 MG tablet   Commonly known as: PLAQUENIL   Take 200 mg by mouth 2 (two) times daily.      metroNIDAZOLE 500 MG tablet   Commonly known as: FLAGYL   Take 1 tablet (500 mg total) by mouth every 6 (six) hours. X 8 more days      oxycodone 30 MG immediate release tablet   Commonly known as: ROXICODONE   Take 30 mg by mouth 3 (three) times daily.      predniSONE 20 MG tablet   Commonly known as: DELTASONE   Take 20 mg by mouth daily.      promethazine 12.5 MG tablet   Commonly known as: PHENERGAN   Take 1-2 tablets (12.5-25 mg total) by  mouth every 6 (six) hours as needed for nausea.      saccharomyces boulardii 250 MG capsule   Commonly known as: FLORASTOR   Take 1 capsule (250 mg total) by mouth 2 (two) times daily.         Brief H and P: For complete details please refer to admission H and P, but in brief 36 yo female who was just discharged on 07/28/12 from hospital after being admitted from 07/25/2012 to 07/28/2012 with presumed viral gastroenteritis. Shortly after she got home she started having nausea and vomiting  several episodes nonbloody. No diarrhea. No abd pain. Patient was unable to keep anything down and returned back to the ED.   Hospital Course:   1. Acute diffuse colitis--presumed infectious. During hospitalization patient reported that her symptoms had actually started after eating [employer] sandwiches daily last week and her husband also had similar symptoms. CT abdomen and pelvis was done which was consistent with acute diffuse colitis presumed infectious. Stool cultures were negative including C. difficile. Patient  was started on IV ciprofloxacin and Flagyl. Gastroenterology was consulted and recommended though further evaluation as her history was more consistent with infectious rather than pseudomembranous colitis or ulcerative colitis. At this time patient is tolerating regular diet and her symptoms have resolved. 2. Orthostatic hypotension--cortisol unremarkable. Patient reported she is chronically on prednisone 20 mg daily (which was not on MAR). This is likely etiology of orthostasis. The patient was started on initially IV hydrocortisone and then transitioned to oral prednisone.  3. Elevated lipase--completely asymptomatic. Symptoms not suggestive of pancreatitis, no abnormalities seen on CT and U/s. Significance unclear. Monitor. 4. Chronic normocytic anemia--stable.  5. Chronic pain--worse secondary to Lupus per patient. Adjust pain medication. 6. Thickening and inflammatory change of the gallbladder  wall without stones--significance unclear, asymptomatic, no gallstones, asymptomatic, etiology and significance unclear, does not appear to be connected to other acute issues. Surgery evaluation was requested for recommendations but did not recommend any acute surgical intervention.. 7. Enlarged left ovary with cystic changes--Underlying cystic mass is not excluded. Consider pelvic ultrasound as outpatient to further characterize.  8. Lupus--stable. Continue CellCept, Plaquenil, resume prednisone. 9. History of pulmonary embolism--Recently completed xarelto. Patient was placed on Lovenox while inpatient.  Day of Discharge BP 122/80  Pulse 65  Temp 98.7 F (37.1 C) (Oral)  Resp 19  Ht 5\' 7"  (1.702 m)  Wt 90.266 kg (199 lb)  BMI 31.17 kg/m2  SpO2 96%  LMP 07/04/2012  Physical Exam: General: Alert and awake oriented x3 not in any acute distress. HEENT: anicteric sclera, pupils reactive to light and accommodation CVS: S1-S2 clear no murmur rubs or gallops Chest: clear to auscultation bilaterally, no wheezing rales or rhonchi Abdomen: soft nontender, nondistended, normal bowel sounds, no organomegaly Extremities: no cyanosis, clubbing or edema noted bilaterally    The results of significant diagnostics from this hospitalization (including imaging, microbiology, ancillary and laboratory) are listed below for reference.    LAB RESULTS: Basic Metabolic Panel:  Lab 08/02/12 1610 08/01/12 0625 07/29/12 1000  NA 136 137 --  K 3.5 2.8* --  CL 102 99 --  CO2 27 27 --  GLUCOSE 82 97 --  BUN 4* 4* --  CREATININE 0.72 0.63 --  CALCIUM 7.8* 8.5 --  MG -- -- 1.9  PHOS -- -- --   Liver Function Tests:  Lab 08/02/12 0742 08/01/12 0625  AST 45* 33  ALT 18 14  ALKPHOS 50 43  BILITOT 0.3 0.3  PROT 6.5 7.6  ALBUMIN 2.6* 2.9*    Lab 08/03/12 0710 08/02/12 0742  LIPASE 119* 169*  AMYLASE -- --   CBC:  Lab 08/01/12 0625 07/29/12 0440 07/28/12 2005  WBC 6.8 8.1 --  NEUTROABS -- --  4.7  HGB 10.1* 8.9* --  HCT 33.1* 28.8* --  MCV 80.1 -- --  PLT 365 284 --   CBG:  Lab 07/28/12 0825 07/27/12 2030  GLUCAP 103* 104*    Significant Diagnostic Studies:  Ct Abdomen Pelvis W Contrast  07/29/2012  *RADIOLOGY REPORT*  Clinical Data: Vomiting  CT ABDOMEN AND PELVIS WITH CONTRAST  Technique:  Multidetector CT imaging of the abdomen and pelvis was performed following the standard protocol during bolus administration of intravenous contrast.  Contrast: 80mL OMNIPAQUE IOHEXOL 300 MG/ML  SOLN  Comparison: None.  Findings: Small bilateral pleural effusions have developed. Basilar pulmonary fibrosis again noted. Esophageal wall thickening is again identified in the lower thorax.  The gallbladder has an unusual appearance with a lamellated appearance.  This may represent unusual  enhancement of the wall or a gallstone.  There is wall thickening and inflammatory change involving nearly all of the colon in a continuous fashion beginning in the mid ascending colon, throughout its course and involving the rectosigmoid.  There is no pneumatosis.  There is stranding in the adjacent fat.  No extraluminal bowel gas.  Liver, spleen, pancreas, adrenal glands, kidneys are within normal limits.  1.5 cm of left para-aortic lymph node conglomerate.  Several small para-aortic nodes are noted.  Normal appendix.  Terminal ileum is within normal limits.  Small amount of free fluid adjacent to the tip of the liver, left paracolic gutter, and in the pelvis.  Uterus and right adnexa are within normal limits.  The left ovary is enlarged with cystic changes.  Underlying cystic mass is not excluded.  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  IMPRESSION: There is wall thickening of nearly the entire colon in a continuous fashion.  Differential diagnosis includes pseudomembranous colitis, ulcerative colitis, infection.  Mild neoplasm and malignancy are less likely.  Bilateral pleural effusions.  Abnormal  wall thickening of the gallbladder versus gallstone. Ultrasound is recommended.  Borderline left periaortic retroperitoneal adenopathy.  Enlarged left ovary with cystic changes.  Underlying cystic mass is not excluded.  Pelvic ultrasound is recommended to further characterize.  Prominent L4-5 disc herniation.   Original Report Authenticated By: Jolaine Click, M.D.      Disposition and Follow-up:     Discharge Orders    Future Orders Please Complete By Expires   Diet - low sodium heart healthy      Increase activity slowly      Discharge instructions      Comments:   Please continue prednisone 40mg  (2 tabs) for 2 days, then resume your regular dose of 20mg        DISPOSITION: Home DIET: Heart healthy diet ACTIVITY: As tolerated   DISCHARGE FOLLOW-UP Follow-up Information    Follow up with Gwynneth Aliment, MD. Schedule an appointment as soon as possible for a visit in 10 days. (for hospital follow-up)    Contact information:   [address] [place] [address] [phone]          Time spent on Discharge: 37 minutes  Signed:   Bandy Honaker M.D. [facility] 08/03/2012, 10:50 AM Pager: [phone]

## 2012-08-03 NOTE — Progress Notes (Signed)
  Subjective: Appears to be feeling much better this morning affect is more engaged, + eye contact, smiling. No c/o offered.  Objective: Vital signs in last 24 hours: Temp:  [98.4 F (36.9 C)-98.7 F (37.1 C)] 98.4 F (36.9 C) (12/24 0600) Pulse Rate:  [61-112] 112  (12/24 0606) Resp:  [18] 18  (12/24 0600) BP: (87-115)/(40-71) 87/49 mmHg (12/24 0606) SpO2:  [96 %-100 %] 96 % (12/24 0600) Last BM Date: 07/31/12  Intake/Output from previous day:   Intake/Output this shift: Total I/O In: 120 [P.O.:120] Out: -   General appearance: alert, cooperative, appears stated age and no distress Abdomen: soft, non tender, No N/V/D. + BS tolerating diet w/o exacerbation of abdominal pain.  Lab Results:   [facility] 08/01/12 0625  WBC 6.8  HGB 10.1*  HCT 33.1*  PLT 365   BMET  Basename 08/02/12 0742 08/01/12 0625  NA 136 137  K 3.5 2.8*  CL 102 99  CO2 27 27  GLUCOSE 82 97  BUN 4* 4*  CREATININE 0.72 0.63  CALCIUM 7.8* 8.5   PT/INR No results found for this basename: LABPROT:2,INR:2 in the last 72 hours ABG No results found for this basename: PHART:2,PCO2:2,PO2:2,HCO3:2 in the last 72 hours  Studies/Results: No results found.  Anti-infectives: Anti-infectives     Start     Dose/Rate Route Frequency Ordered Stop   08/02/12 2000   ciprofloxacin (CIPRO) tablet 500 mg        500 mg Oral 2 times daily 08/02/12 1215     08/02/12 1600   metroNIDAZOLE (FLAGYL) tablet 500 mg        500 mg Oral 4 times per day 08/02/12 1347     08/02/12 1215   metroNIDAZOLE (FLAGYL) tablet 500 mg  Status:  Discontinued        500 mg Oral 4 times per day 08/02/12 1215 08/02/12 1347   07/29/12 1900   metroNIDAZOLE (FLAGYL) IVPB 500 mg  Status:  Discontinued        500 mg 100 mL/hr over 60 Minutes Intravenous Every 8 hours 07/29/12 1650 08/02/12 1215   07/29/12 1800   ciprofloxacin (CIPRO) IVPB 400 mg  Status:  Discontinued        400 mg 200 mL/hr over 60 Minutes Intravenous Every 12 hours  07/29/12 1650 08/02/12 1215   07/29/12 0315   hydroxychloroquine (PLAQUENIL) tablet 200 mg        200 mg Oral 2 times daily 07/29/12 1610            Assessment/Plan: s/p * No surgery found *  Patient Active Problem List  Diagnosis  . Dehydration  . Nausea and vomiting  . Hypotension  . SLE exacerbation  . Chronic pain  . GERD (gastroesophageal reflux disease)  . History of pulmonary embolism  . Hypokalemia  . Lupus erythematosus  . Pancreatitis, acute  . Colitis, acute  . Normocytic anemia  . Orthostatic hypotension  Management per medicine team At this time it appears she is improving rapidly, and that her abdominal pain was most likely colitis vs GBD.  No surgical intervention indicated at this time, so we will sign off at this time.  LOS: 6 days    [facility] Pager # [phone] 08/03/2012

## 2012-08-03 NOTE — Care Management Note (Signed)
    Page 1 of 1   08/03/2012     1:49:49 PM   CARE MANAGEMENT NOTE 08/03/2012  Patient:  Ferdig,Elize N   Account Number:  [id number]  Date Initiated:  08/03/2012  Documentation initiated by:  [facility]  Subjective/Objective Assessment:   Admitted pancreatitis, colitis     Action/Plan:   Anticipated DC Date:  08/03/2012   Anticipated DC Plan:  HOME/SELF CARE      DC Planning Services  CM consult      Choice offered to / List presented to:             Status of service:  Completed, signed off Medicare Important Message given?   (If response is "NO", the following Medicare IM given date fields will be blank) Date Medicare IM given:   Date Additional Medicare IM given:    Discharge Disposition:  HOME/SELF CARE  Per UR Regulation:  Reviewed for med. necessity/level of care/duration of stay  If discussed at Long Length of Stay Meetings, dates discussed:   08/03/2012    Comments:

## 2012-08-03 NOTE — Progress Notes (Signed)
Patient discharged, awaiting on transportation, prescriptions given and all questions answered.assessments remaining unchanged as at now.

## 2012-08-05 NOTE — Progress Notes (Signed)
Patient was given 1.5mg  of dilaudid after pulling 2mg  from pyxis, could not waste it in pyxis because patient had left, there wasted it before an RN(esther)

## 2013-01-20 DIAGNOSIS — M329 Systemic lupus erythematosus, unspecified: Secondary | ICD-10-CM | POA: Insufficient documentation

## 2014-03-14 ENCOUNTER — Emergency Department (HOSPITAL_COMMUNITY)

## 2014-03-14 ENCOUNTER — Encounter (HOSPITAL_COMMUNITY): Payer: Self-pay | Admitting: Emergency Medicine

## 2014-03-14 ENCOUNTER — Emergency Department (HOSPITAL_COMMUNITY)
Admission: EM | Admit: 2014-03-14 | Discharge: 2014-03-15 | Disposition: A | Discharge status: Home / Self Care | Attending: Emergency Medicine | Admitting: Emergency Medicine

## 2014-03-14 DIAGNOSIS — R197 Diarrhea, unspecified: Secondary | ICD-10-CM | POA: Insufficient documentation

## 2014-03-14 DIAGNOSIS — G8929 Other chronic pain: Secondary | ICD-10-CM | POA: Insufficient documentation

## 2014-03-14 DIAGNOSIS — R21 Rash and other nonspecific skin eruption: Secondary | ICD-10-CM | POA: Insufficient documentation

## 2014-03-14 DIAGNOSIS — R059 Cough, unspecified: Secondary | ICD-10-CM | POA: Insufficient documentation

## 2014-03-14 DIAGNOSIS — R000 Tachycardia, unspecified: Secondary | ICD-10-CM | POA: Insufficient documentation

## 2014-03-14 DIAGNOSIS — R110 Nausea: Secondary | ICD-10-CM | POA: Insufficient documentation

## 2014-03-14 DIAGNOSIS — J40 Bronchitis, not specified as acute or chronic: Secondary | ICD-10-CM | POA: Insufficient documentation

## 2014-03-14 DIAGNOSIS — Z86711 Personal history of pulmonary embolism: Secondary | ICD-10-CM | POA: Insufficient documentation

## 2014-03-14 DIAGNOSIS — Z3202 Encounter for pregnancy test, result negative: Secondary | ICD-10-CM | POA: Insufficient documentation

## 2014-03-14 DIAGNOSIS — M329 Systemic lupus erythematosus, unspecified: Secondary | ICD-10-CM | POA: Insufficient documentation

## 2014-03-14 DIAGNOSIS — K219 Gastro-esophageal reflux disease without esophagitis: Secondary | ICD-10-CM | POA: Insufficient documentation

## 2014-03-14 DIAGNOSIS — Z79899 Other long term (current) drug therapy: Secondary | ICD-10-CM | POA: Insufficient documentation

## 2014-03-14 DIAGNOSIS — R05 Cough: Secondary | ICD-10-CM

## 2014-03-14 DIAGNOSIS — IMO0002 Reserved for concepts with insufficient information to code with codable children: Secondary | ICD-10-CM | POA: Insufficient documentation

## 2014-03-14 LAB — URINALYSIS, ROUTINE W REFLEX MICROSCOPIC
Glucose, UA: NEGATIVE mg/dL
Hgb urine dipstick: NEGATIVE
KETONES UR: 15 mg/dL — AB
LEUKOCYTES UA: NEGATIVE
NITRITE: NEGATIVE
PROTEIN: 30 mg/dL — AB
Specific Gravity, Urine: 1.024 (ref 1.005–1.030)
UROBILINOGEN UA: 0.2 mg/dL (ref 0.0–1.0)
pH: 5.5 (ref 5.0–8.0)

## 2014-03-14 LAB — COMPREHENSIVE METABOLIC PANEL
ALT: 9 U/L (ref 0–35)
ANION GAP: 12 (ref 5–15)
AST: 27 U/L (ref 0–37)
Albumin: 3.5 g/dL (ref 3.5–5.2)
Alkaline Phosphatase: 51 U/L (ref 39–117)
BUN: 8 mg/dL (ref 6–23)
CALCIUM: 8.9 mg/dL (ref 8.4–10.5)
CO2: 27 meq/L (ref 19–32)
CREATININE: 0.88 mg/dL (ref 0.50–1.10)
Chloride: 97 mEq/L (ref 96–112)
GFR, EST NON AFRICAN AMERICAN: 84 mL/min — AB (ref >90)
GLUCOSE: 85 mg/dL (ref 70–99)
Potassium: 4.3 mEq/L (ref 3.7–5.3)
Sodium: 136 mEq/L — ABNORMAL LOW (ref 137–147)
Total Bilirubin: 0.4 mg/dL (ref 0.3–1.2)
Total Protein: 8.7 g/dL — ABNORMAL HIGH (ref 6.0–8.3)

## 2014-03-14 LAB — CBC WITH DIFFERENTIAL/PLATELET
BASOS ABS: 0 10*3/uL (ref 0.0–0.1)
Basophils Relative: 0 % (ref 0–1)
EOS PCT: 4 % (ref 0–5)
Eosinophils Absolute: 0.3 10*3/uL (ref 0.0–0.7)
HEMATOCRIT: 37 % (ref 36.0–46.0)
Hemoglobin: 11.7 g/dL — ABNORMAL LOW (ref 12.0–15.0)
LYMPHS PCT: 16 % (ref 12–46)
Lymphs Abs: 1.3 10*3/uL (ref 0.7–4.0)
MCH: 27.3 pg (ref 26.0–34.0)
MCHC: 31.6 g/dL (ref 30.0–36.0)
MCV: 86.2 fL (ref 78.0–100.0)
MONO ABS: 0.4 10*3/uL (ref 0.1–1.0)
MONOS PCT: 4 % (ref 3–12)
Neutro Abs: 6.5 10*3/uL (ref 1.7–7.7)
Neutrophils Relative %: 76 % (ref 43–77)
Platelets: 300 10*3/uL (ref 150–400)
RBC: 4.29 MIL/uL (ref 3.87–5.11)
RDW: 13.2 % (ref 11.5–15.5)
WBC: 8.5 10*3/uL (ref 4.0–10.5)

## 2014-03-14 LAB — URINE MICROSCOPIC-ADD ON

## 2014-03-14 LAB — PREGNANCY, URINE: Preg Test, Ur: NEGATIVE

## 2014-03-14 LAB — TROPONIN I

## 2014-03-14 MED ORDER — ONDANSETRON HCL 4 MG/2ML IJ SOLN
4.0000 mg | INTRAMUSCULAR | Status: AC
  Administered 2014-03-14: 4 mg via INTRAVENOUS
  Filled 2014-03-14: qty 2

## 2014-03-14 MED ORDER — AEROCHAMBER PLUS FLO-VU LARGE MISC
1.0000 | Freq: Once | Status: AC
  Administered 2014-03-15: 1
  Filled 2014-03-14: qty 1

## 2014-03-14 MED ORDER — IOHEXOL 350 MG/ML SOLN
100.0000 mL | Freq: Once | INTRAVENOUS | Status: AC | PRN
  Administered 2014-03-14: 100 mL via INTRAVENOUS

## 2014-03-14 MED ORDER — PREDNISONE 20 MG PO TABS: 20.0000 mg | ORAL_TABLET | Freq: Every day | ORAL | Status: DC

## 2014-03-14 MED ORDER — HYDROCORTISONE NA SUCCINATE PF 100 MG IJ SOLR
100.0000 mg | Freq: Once | INTRAMUSCULAR | Status: AC
  Administered 2014-03-14: 100 mg via INTRAVENOUS
  Filled 2014-03-14: qty 2

## 2014-03-14 MED ORDER — KETOROLAC TROMETHAMINE 30 MG/ML IJ SOLN
30.0000 mg | Freq: Once | INTRAMUSCULAR | Status: AC
  Administered 2014-03-14: 30 mg via INTRAVENOUS
  Filled 2014-03-14: qty 1

## 2014-03-14 MED ORDER — SODIUM CHLORIDE 0.9 % IV BOLUS (SEPSIS)
1000.0000 mL | Freq: Once | INTRAVENOUS | Status: AC
  Administered 2014-03-14: 1000 mL via INTRAVENOUS

## 2014-03-14 MED ORDER — MORPHINE SULFATE 4 MG/ML IJ SOLN
4.0000 mg | Freq: Once | INTRAMUSCULAR | Status: AC
  Administered 2014-03-14: 4 mg via INTRAVENOUS
  Filled 2014-03-14: qty 1

## 2014-03-14 MED ORDER — ALBUTEROL SULFATE HFA 108 (90 BASE) MCG/ACT IN AERS
2.0000 | INHALATION_SPRAY | RESPIRATORY_TRACT | Status: DC | PRN
  Filled 2014-03-14: qty 6.7

## 2014-03-14 MED ORDER — AZITHROMYCIN 250 MG PO TABS: 250.0000 mg | ORAL_TABLET | Freq: Every day | ORAL | Status: DC

## 2014-03-14 NOTE — Discharge Instructions (Signed)
Take azithromycin as prescribed. Also recommend you use an albuterol inhaler, 2 puffs every 4 hours, as needed for shortness of breath. Take prednisone as prescribed. Followup with your primary care doctor to ensure that symptoms resolve. Return to the emergency department if symptoms worsen.  Acute Bronchitis Bronchitis is inflammation of the airways that extend from the windpipe into the lungs (bronchi). The inflammation often causes mucus to develop. This leads to a cough, which is the most common symptom of bronchitis.  In acute bronchitis, the condition usually develops suddenly and goes away over time, usually in a couple weeks. Smoking, allergies, and asthma can make bronchitis worse. Repeated episodes of bronchitis may cause further lung problems.  CAUSES Acute bronchitis is most often caused by the same virus that causes a cold. The virus can spread from person to person (contagious) through coughing, sneezing, and touching contaminated objects. SIGNS AND SYMPTOMS   Cough.   Fever.   Coughing up mucus.   Body aches.   Chest congestion.   Chills.   Shortness of breath.   Sore throat.  DIAGNOSIS  Acute bronchitis is usually diagnosed through a physical exam. Your health care provider will also ask you questions about your medical history. Tests, such as chest X-rays, are sometimes done to rule out other conditions.  TREATMENT  Acute bronchitis usually goes away in a couple weeks. Oftentimes, no medical treatment is necessary. Medicines are sometimes given for relief of fever or cough. Antibiotic medicines are usually not needed but may be prescribed in certain situations. In some cases, an inhaler may be recommended to help reduce shortness of breath and control the cough. A cool mist vaporizer may also be used to help thin bronchial secretions and make it easier to clear the chest.  HOME CARE INSTRUCTIONS  Get plenty of rest.   Drink enough fluids to keep your urine  clear or pale yellow (unless you have a medical condition that requires fluid restriction). Increasing fluids may help thin your respiratory secretions (sputum) and reduce chest congestion, and it will prevent dehydration.   Take medicines only as directed by your health care provider.  If you were prescribed an antibiotic medicine, finish it all even if you start to feel better.  Avoid smoking and secondhand smoke. Exposure to cigarette smoke or irritating chemicals will make bronchitis worse. If you are a smoker, consider using nicotine gum or skin patches to help control withdrawal symptoms. Quitting smoking will help your lungs heal faster.   Reduce the chances of another bout of acute bronchitis by washing your hands frequently, avoiding people with cold symptoms, and trying not to touch your hands to your mouth, nose, or eyes.   Keep all follow-up visits as directed by your health care provider.  SEEK MEDICAL CARE IF: Your symptoms do not improve after 1 week of treatment.  SEEK IMMEDIATE MEDICAL CARE IF:  You develop an increased fever or chills.   You have chest pain.   You have severe shortness of breath.  You have bloody sputum.   You develop dehydration.  You faint or repeatedly feel like you are going to pass out.  You develop repeated vomiting.  You develop a severe headache. MAKE SURE YOU:   Understand these instructions.  Will watch your condition.  Will get help right away if you are not doing well or get worse. Document Released: 09/04/2004 Document Revised: 12/12/2013 Document Reviewed: 01/18/2013 [facility] Patient Information 2015 [place], [place]. This information is not intended to replace  advice given to you by your health care provider. Make sure you discuss any questions you have with your health care provider.

## 2014-03-14 NOTE — ED Notes (Signed)
Pt presents for evaluation of lupus flare up- pt ran out of Prednisone 3 weeks ago and hasn't been able to get to PCP.  Pt reports rash, nausea, and shortness of breath associated with symptoms, denies CP.  Pt also having a productive cough yellow in color for the past 3 days, took Tylenol at home without relief.

## 2014-03-14 NOTE — ED Provider Notes (Signed)
CSN: [id number]     Arrival date & time 03/14/14  1738 History   First MD Initiated Contact with Patient 03/14/14 2010     Chief Complaint  Patient presents with  . Cough  . Pain    (Consider location/radiation/quality/duration/timing/severity/associated sxs/prior Treatment) HPI Comments: Patient is a 38 y/o female with a hx of lupus (on chronic prednisone), PE (off anticoagulants), reflux, and chest pain who presents to the ED today for cough x 1 week. Patient states she has had a cough productive of yellow/green sputum. Patient endorses associated SOB, nausea, and subjective fever as she has been experiencing chills and sweats. She states her SOB is worse with exertion. Patient has taken Tylenol at home without relief. She states she has been out of her prednisone for 3 weeks. As a result she has noticed a macular, erythematous rash to her extremities and trunk c/w a lupus flare. Patient also endorsing a few loose stools. She denies associated CP, hemoptysis, syncope, vomiting, extremity numbness/paresthesias, and extremity weakness.  PCP - Dr. Allyne Gee Rheumatologist - Dr. Herma Carson of [facility]  Patient is a 38 y.o. female presenting with cough. The history is provided by the patient. No language interpreter was used.  Cough Associated symptoms: chills, diaphoresis, fever (subjective), rash and shortness of breath   Associated symptoms: no chest pain     Past Medical History  Diagnosis Date  . Lupus   . Pulmonary embolism   . GERD (gastroesophageal reflux disease)   . Chronic pain    Past Surgical History  Procedure Laterality Date  . Pericardial window      in 2007   No family history on file. History  Substance Use Topics  . Smoking status: Never Smoker   . Smokeless tobacco: Not on file  . Alcohol Use: No   OB History   Grav Para Term Preterm Abortions TAB SAB Ect Mult Living                  Review of Systems  Constitutional: Positive for fever (subjective), chills and  diaphoresis.  Respiratory: Positive for cough and shortness of breath.   Cardiovascular: Negative for chest pain.  Gastrointestinal: Positive for nausea and diarrhea. Negative for vomiting and blood in stool.  Skin: Positive for rash.  Neurological: Negative for syncope, weakness and numbness.  All other systems reviewed and are negative.    Allergies  Imuran and Tramadol  Home Medications   Prior to Admission medications   Medication Sig Start Date End Date Taking? Authorizing Provider  acetaminophen (TYLENOL) 325 MG tablet Take 650 mg by mouth every 6 (six) hours as needed for mild pain.   Yes Historical Provider, MD  esomeprazole (NEXIUM) 20 MG capsule Take 20 mg by mouth daily before breakfast.    Yes Historical Provider, MD  feeding supplement (ENSURE COMPLETE) LIQD Take 237 mLs by mouth daily as needed (per pt request, likes vanilla). 07/28/12  Yes Leroy Sea, MD  fentaNYL (DURAGESIC - DOSED MCG/HR) 75 MCG/HR Place 1 patch onto the skin every 3 (three) days.    Yes Historical Provider, MD  hydroxychloroquine (PLAQUENIL) 200 MG tablet Take 200 mg by mouth 2 (two) times daily.    Yes Historical Provider, MD  mycophenolate (CELLCEPT) 500 MG tablet Take 1,500 mg by mouth 2 (two) times daily.    Yes Historical Provider, MD  oxycodone (ROXICODONE) 30 MG immediate release tablet Take 30 mg by mouth 3 (three) times daily.   Yes Historical Provider, MD  saccharomyces boulardii (FLORASTOR) 250 MG capsule Take 250 mg by mouth 2 (two) times daily.   Yes Historical Provider, MD  azithromycin (ZITHROMAX Z-PAK) 250 MG tablet Take 1 tablet (250 mg total) by mouth daily. 500mg  PO day 1, then 250mg  PO days 205 03/14/14   Antony Madura, PA-C  predniSONE (DELTASONE) 20 MG tablet Take 1 tablet (20 mg total) by mouth daily. 03/14/14   Antony Madura, PA-C   BP 102/60  Pulse 84  Temp(Src) 98 F (36.7 C) (Oral)  Resp 16  SpO2 99%  LMP 03/01/2014  Physical Exam  Nursing note and vitals  reviewed. Constitutional: She is oriented to person, place, and time. She appears well-developed and well-nourished. No distress.  Nontoxic appearing  HENT:  Head: Normocephalic and atraumatic.  Mouth/Throat: Oropharynx is clear and moist. No oropharyngeal exudate.  Eyes: Conjunctivae and EOM are normal. No scleral icterus.  Neck: Normal range of motion.  Cardiovascular: Regular rhythm.  Tachycardia present.   Mild tachycardia  Pulmonary/Chest: Effort normal. No respiratory distress. She has no wheezes. She has no rales.  Dyspnea noted with prolonged speaking. No tachypnea or retractions. Chest expansion symmetric.   Musculoskeletal: Normal range of motion.  Neurological: She is alert and oriented to person, place, and time. She exhibits normal muscle tone. Coordination normal.  GCS 15. Patient moves extremities without ataxia.  Skin: Skin is warm and dry. Rash noted. She is not diaphoretic. No erythema. No pallor.  Erythematous, macular rash appreciated to extremities. No skin peeling, weeping/drainage, or plaques. No butterfly/malar rash to face.  Psychiatric: She has a normal mood and affect. Her behavior is normal.    ED Course  Procedures (including critical care time) Labs Review Labs Reviewed  CBC WITH DIFFERENTIAL - Abnormal; Notable for the following:    Hemoglobin 11.7 (*)    All other components within normal limits  COMPREHENSIVE METABOLIC PANEL - Abnormal; Notable for the following:    Sodium 136 (*)    Total Protein 8.7 (*)    GFR calc non Af Amer 84 (*)    All other components within normal limits  URINALYSIS, ROUTINE W REFLEX MICROSCOPIC - Abnormal; Notable for the following:    Color, Urine GREEN (*)    APPearance CLOUDY (*)    Bilirubin Urine SMALL (*)    Ketones, ur 15 (*)    Protein, ur 30 (*)    All other components within normal limits  URINE MICROSCOPIC-ADD ON - Abnormal; Notable for the following:    Casts HYALINE CASTS (*)    All other components  within normal limits  TROPONIN I  PREGNANCY, URINE   Imaging Review Dg Chest 2 View  03/14/2014   CLINICAL DATA:  Cough, shortness of breath  EXAM: CHEST  2 VIEW  COMPARISON:  Chest radiograph 07/25/2012  FINDINGS: Stable cardiomegaly. Low lung volumes. Unchanged bilateral coarse lower lung interstitial pulmonary opacities. No pleural effusion or pneumothorax. Regional skeleton is unremarkable.  IMPRESSION: Stable cardiomegaly.  Unchanged bilateral lower lung interstitial opacities.   Electronically Signed   By: Annia Belt M.D.   On: 03/14/2014 18:57   Ct Angio Chest Pe W/cm &/or Wo Cm  03/14/2014   CLINICAL DATA:  r/o PE; SOB; tachycardia  EXAM: CT ANGIOGRAPHY CHEST WITH CONTRAST  TECHNIQUE: Multidetector CT imaging of the chest was performed using the standard protocol during bolus administration of intravenous contrast. Multiplanar CT image reconstructions and MIPs were obtained to evaluate the vascular anatomy.  CONTRAST:  OMNIPAQUE IOHEXOL 350 MG/ML  SOLN  COMPARISON:  Prior radiograph from earlier the same day as well as prior CT from 07/25/2012.  FINDINGS: Visualized portions of the thyroid gland are within normal limits.  Nonspecific mediastinal and right hilar lymph nodes again seen, not significantly changed relative to prior exam. Index right hilar node measures 13 mm in short axis, previously 15 mm. Axillary adenopathy grossly stable as well.  Intrathoracic aorta are normal caliber and appearance. Great vessels within normal limits.  Thoracic esophagus is mildly patulous without acute inflammatory changes.  Cardiomegaly is stable.  No pericardial effusion.  Pulmonary arterial tree is well opacified, although evaluation of the peripheral segmental arteries is somewhat limited due to respiratory motion artifact, protruded early within the lung bases. No definite filling defect to suggest acute pulmonary embolism identified. Re-formatted imaging confirms these findings.  Basilar predominant  subpleural reticulation with honeycombing in again seen within the lungs. There is bibasilar bronchiectasis with ground-glass opacity in architectural distortion, compatible with underlying fibrosis. Overall, these findings are similar as compared to prior study. No definite superimposed opacities to suggest active infection. No pulmonary edema. No worrisome pulmonary nodules identified.  Visualized portions of the upper abdomen are unremarkable.  No acute osseous abnormality. No worrisome lytic or blastic osseous lesions.  Review of the MIP images confirms the above findings.  IMPRESSION: 1. No acute pulmonary embolus or other acute cardiopulmonary abnormality. 2. Similar appearance of peripheral and basilar predominant pulmonary fibrosis and interstitial lung disease without significant interval progression as compared to most recent CT from 07/25/2012. 3. Similar mediastinal, hilar, and axillary adenopathy. 4. Stable cardiomegaly. 5. Patulous esophagus.   Electronically Signed   By: Rise Mu M.D.   On: 03/14/2014 23:00     EKG Interpretation   Date/Time:  Tuesday March 14 2014 18:23:01 EDT Ventricular Rate:  107 PR Interval:  164 QRS Duration: 76 QT Interval:  300 QTC Calculation: 400 R Axis:   19 Text Interpretation:  Sinus tachycardia Possible Left atrial enlargement  Low voltage QRS Septal infarct , age undetermined Abnormal ECG Confirmed  by Rhunette Croft, MD, Janey Genta [phone]) on 03/14/2014 10:33:14 PM      MDM   Final diagnoses:  Bronchitis  Cough    Patient with a hx of lupus and PE presents to the ED for cough, SOB, subjective fever/chills, and nausea. She is on chronic prednisone, but has been out of this medication x 3 weeks. Patient noted to be tachycardic on arrival.  Physical exam today significant for mild dyspnea with prolonged speaking. No tachypnea noted. VSS without hypoxia. No hypotension. Work up today revealed no leukocytosis, anemia, or electrolyte imbalance.  CXR stable compared to prior study in December.   Given tachycardia and respiratory symptoms, CT PE study pursued to r/o pulmonary embolus. CT imaging today revealed no PE. No evidence of superimposed opacities to suggest active infection. CT also stable compared to prior studies.  Doubt ACS given infectious nature of symptoms such as subjective fever/chills; no chest pain. Cardiac work up today is negative for signs of acute ischemia. Suspect symptoms secondary to bronchitis.   Patient has been able to ambulate in the ED without hypoxia. Her vitals have normalized with IVF. Pain improved with Toradol and morphine. Patient also given Solu-Cortef given her hx of chronic prednisone use. Believe patient is stable for outpatient management at this time with PCP f/u. Will manage symptoms with Z-pack, prednisone, and albuterol inhaler. Return precautions discussed and provided. Patient agreeable to plan with no unaddressed concerns.   Filed  Vitals:   03/14/14 1807 03/14/14 2229 03/14/14 2339  BP: 100/60 102/60 109/72  Pulse: 109 84 88  Temp: 97.5 F (36.4 C) 98 F (36.7 C)   TempSrc: Oral Oral   Resp: 16 16 16   SpO2: 100% 99% 100%     , PA-C 03/14/14 2349

## 2014-03-14 NOTE — ED Notes (Signed)
Pt ambulated unassisted with steady gait around nurses station. Oxygen saturation decreased to 92% on room air with good pleth. Pt reported a slight increase in shortness of breath, respiration rate increased to 22 breaths per minutes, respirations became more shallow.

## 2014-03-14 NOTE — ED Notes (Signed)
Patient transported to CT 

## 2014-03-14 NOTE — ED Notes (Signed)
Pt returned from CT °

## 2014-03-15 NOTE — ED Provider Notes (Signed)
Medical screening examination/treatment/procedure(s) were performed by non-physician practitioner and as supervising physician I was immediately available for consultation/collaboration.   EKG Interpretation   Date/Time:  Tuesday March 14 2014 18:23:01 EDT Ventricular Rate:  107 PR Interval:  164 QRS Duration: 76 QT Interval:  300 QTC Calculation: 400 R Axis:   19 Text Interpretation:  Sinus tachycardia Possible Left atrial enlargement  Low voltage QRS Septal infarct , age undetermined Abnormal ECG Confirmed  by Rhunette Croft, MD, Janey Genta [phone]) on 03/14/2014 10:33:14 PM       Derwood Kaplan, MD 03/15/14 2010

## 2014-11-24 ENCOUNTER — Encounter (HOSPITAL_COMMUNITY): Payer: Self-pay | Admitting: *Deleted

## 2014-11-24 ENCOUNTER — Emergency Department (HOSPITAL_COMMUNITY)
Admission: EM | Admit: 2014-11-24 | Discharge: 2014-11-24 | Disposition: A | Discharge status: Home / Self Care | Attending: Emergency Medicine | Admitting: Emergency Medicine

## 2014-11-24 DIAGNOSIS — Z86711 Personal history of pulmonary embolism: Secondary | ICD-10-CM | POA: Diagnosis not present

## 2014-11-24 DIAGNOSIS — Z79899 Other long term (current) drug therapy: Secondary | ICD-10-CM | POA: Insufficient documentation

## 2014-11-24 DIAGNOSIS — G8929 Other chronic pain: Secondary | ICD-10-CM | POA: Insufficient documentation

## 2014-11-24 DIAGNOSIS — K219 Gastro-esophageal reflux disease without esophagitis: Secondary | ICD-10-CM | POA: Insufficient documentation

## 2014-11-24 DIAGNOSIS — M329 Systemic lupus erythematosus, unspecified: Secondary | ICD-10-CM | POA: Insufficient documentation

## 2014-11-24 DIAGNOSIS — R000 Tachycardia, unspecified: Secondary | ICD-10-CM | POA: Insufficient documentation

## 2014-11-24 DIAGNOSIS — R52 Pain, unspecified: Secondary | ICD-10-CM

## 2014-11-24 MED ORDER — KETOROLAC TROMETHAMINE 30 MG/ML IJ SOLN: 30.0000 mg | Freq: Once | INTRAMUSCULAR | Status: DC

## 2014-11-24 MED ORDER — PREDNISONE 20 MG PO TABS: 20.0000 mg | ORAL_TABLET | Freq: Every day | ORAL | Status: DC

## 2014-11-24 MED ORDER — KETOROLAC TROMETHAMINE 30 MG/ML IJ SOLN
30.0000 mg | Freq: Once | INTRAMUSCULAR | Status: AC
  Administered 2014-11-24: 30 mg via INTRAVENOUS

## 2014-11-24 MED ORDER — KETOROLAC TROMETHAMINE 30 MG/ML IJ SOLN
INTRAMUSCULAR | Status: AC
  Filled 2014-11-24: qty 1

## 2014-11-24 MED ORDER — METHYLPREDNISOLONE SODIUM SUCC 125 MG IJ SOLR
125.0000 mg | Freq: Once | INTRAMUSCULAR | Status: AC
  Administered 2014-11-24: 125 mg via INTRAVENOUS
  Filled 2014-11-24: qty 2

## 2014-11-24 MED ORDER — HYDROMORPHONE HCL 1 MG/ML IJ SOLN
1.0000 mg | Freq: Once | INTRAMUSCULAR | Status: AC
  Administered 2014-11-24: 1 mg via INTRAVENOUS
  Filled 2014-11-24: qty 1

## 2014-11-24 MED ORDER — SODIUM CHLORIDE 0.9 % IV BOLUS (SEPSIS)
1000.0000 mL | Freq: Once | INTRAVENOUS | Status: AC
  Administered 2014-11-24: 1000 mL via INTRAVENOUS

## 2014-11-24 NOTE — Discharge Instructions (Signed)
Lupus Lupus (also called systemic lupus erythematosus, SLE) is a disorder of the body's natural defense system (immune system). In lupus, the immune system attacks various areas of the body (autoimmune disease). CAUSES The cause is unknown. However, lupus runs in families. Certain genes can make you more likely to develop lupus. It is 10 times more common in women than in men. Lupus is also more common in African Americans and Asians. Other factors also play a role, such as viruses (Epstein-Barr virus, EBV), stress, hormones, cigarette smoke, and certain drugs. SYMPTOMS Lupus can affect many parts of the body, including the joints, skin, kidneys, lungs, heart, nervous system, and blood vessels. The signs and symptoms of lupus differ from person to person. The disease can range from mild to life-threatening. Typical features of lupus include:  Butterfly-shaped rash over the face.  Arthritis involving one or more joints.  Kidney disease.  Fever, weight loss, hair loss, fatigue.  Poor circulation in the fingers and toes (Raynaud's disease).  Chest pain when taking deep breaths. Abdominal pain may also occur.  Skin rash in areas exposed to the sun.  Sores in the mouth and nose. DIAGNOSIS Diagnosing lupus can take a long time and is often difficult. An exam and an accurate account of your symptoms and health problems is very important. Blood tests are necessary, though no single test can confirm or rule out lupus. Most people with lupus test positive for antinuclear antibodies (ANA) on a blood test. Additional blood tests, a urine test (urinalysis), and sometimes a kidney or skin tissue sample (biopsy) can help to confirm or rule out lupus. TREATMENT There is no cure for lupus. Your caregiver will develop a treatment plan based on your age, sex, health, symptoms, and lifestyle. The goals are to prevent flares, to treat them when they do occur, and to minimize organ damage and complications. How  the disease may affect each person varies widely. Most people with lupus can live normal lives, but this disorder must be carefully monitored. Treatment must be adjusted as necessary to prevent serious complications. Medicines used for treatment:  Nonsteroidal anti-inflammatory drugs (NSAIDs) decrease inflammation and can help with chest pain, joint pain, and fevers. Examples include ibuprofen and naproxen.  Antimalarial drugs were designed to treat malaria. They also treat fatigue, joint pain, skin rashes, and inflammation of the lungs in patients with lupus.  Corticosteroids are powerful hormones that rapidly suppress inflammation. The lowest dose with the highest benefit will be chosen. They can be given by cream, pills, injections, and through the vein (intravenously).  Immunosuppressive drugs block the making of immune cells. They may be used for kidney or nerve disease. HOME CARE INSTRUCTIONS  Exercise. Low-impact activities can usually help keep joints flexible without being too strenuous.  Rest after periods of exercise.  Avoid excessive sun exposure.  Follow proper nutrition and take supplements as recommended by your caregiver.  Stress management can be helpful. SEEK MEDICAL CARE IF:  You have increased fatigue.  You develop pain.  You develop a rash.  You have an oral temperature above 102 F (38.9 C).  You develop abdominal discomfort.  You develop a headache.  You experience dizziness. [place] of Neurological Disorders and Stroke: [employer] of Rheumatology: www.rheumatology.[facility] of Arthritis and Musculoskeletal and Skin Diseases: www.niams.[employer] Document Released: 07/18/2002 Document Revised: 10/20/2011 Document Reviewed: 11/08/2009 [facility] Patient Information 2015 [place], [place]. This information is not intended to replace advice given to you by your health  care provider. Make sure  you discuss any questions you have with your health care provider.  Please take your medications as directed. It is important to follow up with rheumatologist and your primary care for further evaluation and management of your symptoms. Return to ED for new or worsening symptoms.

## 2014-11-24 NOTE — ED Notes (Signed)
Pt reports having lupus flare up x 5 days pain to entire body but more severe to head and legs. Is out of her prednisone, no relief with pain meds.

## 2014-11-24 NOTE — ED Provider Notes (Signed)
CSN: [id number]     Arrival date & time 11/24/14  1738 History  This chart was scribed for Joycie Peek, PA-C with No att. providers found by Tonye Royalty, ED Scribe. This patient was seen in room TR02C/TR02C and the patient's care was started at 6:21 PM.    Chief Complaint  Patient presents with  . Lupus  . Pain   HPI  HPI Comments: Elizabeth Baxter is a 39 y.o. female with history of PE, lupus who presents to the Emergency Department complaining of diffuse aching with gradual onset 5 days ago. She rates pain at 9/10. She states this feels typical of her lupus. She reports associated headaches, dizziness, muscle spasms, and decreased appetite and fluid intake. She has not seen her rheumatologist; she called his office and spoke with a Diplomatic Services operational officer, but the doctor has not called her back yet. She states lupus is typically controlled with Prednisone but states she has ben out for 5 days. She states her rheumatologist called in a prescription as usual, but the pharmacy denied it. Her rheumatologist is Dr. Sharmon Revere in [facility], and her last visit was last year. She reports feeling warm 2 days ago, but denies any measured fevers. Denies any chest pain, shortness of breath, nausea or vomiting, abdominal pain, numbness or weakness, syncope. She denies other symptoms. No other aggravating or modifying factors.  Past Medical History  Diagnosis Date  . Lupus   . Pulmonary embolism   . GERD (gastroesophageal reflux disease)   . Chronic pain    Past Surgical History  Procedure Laterality Date  . Pericardial window      in 2007   History reviewed. No pertinent family history. History  Substance Use Topics  . Smoking status: Never Smoker   . Smokeless tobacco: Not on file  . Alcohol Use: No   OB History    No data available     Review of Systems  Constitutional: Positive for appetite change.  Gastrointestinal: Negative for nausea, vomiting and abdominal pain.  Musculoskeletal: Positive for  myalgias.  Skin: Positive for rash (baseline).  Neurological: Positive for dizziness and headaches.  All other systems reviewed and are negative.     Allergies  Imuran and Tramadol  Home Medications   Prior to Admission medications   Medication Sig Start Date End Date Taking? Authorizing Provider  acetaminophen (TYLENOL) 325 MG tablet Take 650 mg by mouth every 6 (six) hours as needed for mild pain.    Historical Provider, MD  azithromycin (ZITHROMAX Z-PAK) 250 MG tablet Take 1 tablet (250 mg total) by mouth daily. 500mg  PO day 1, then 250mg  PO days 205 03/14/14   , PA-C  esomeprazole (NEXIUM) 20 MG capsule Take 20 mg by mouth daily before breakfast.     Historical Provider, MD  feeding supplement (ENSURE COMPLETE) LIQD Take 237 mLs by mouth daily as needed (per pt request, likes vanilla). 07/28/12   Antony Madura, MD  fentaNYL (DURAGESIC - DOSED MCG/HR) 75 MCG/HR Place 1 patch onto the skin every 3 (three) days.     Historical Provider, MD  hydroxychloroquine (PLAQUENIL) 200 MG tablet Take 200 mg by mouth 2 (two) times daily.     Historical Provider, MD  mycophenolate (CELLCEPT) 500 MG tablet Take 1,500 mg by mouth 2 (two) times daily.     Historical Provider, MD  oxycodone (ROXICODONE) 30 MG immediate release tablet Take 30 mg by mouth 3 (three) times daily.    Historical Provider, MD  predniSONE (  DELTASONE) 20 MG tablet Take 1 tablet (20 mg total) by mouth daily. 11/24/14   Joycie Peek, PA-C  saccharomyces boulardii (FLORASTOR) 250 MG capsule Take 250 mg by mouth 2 (two) times daily.    Historical Provider, MD   BP 102/67 mmHg  Pulse 107  Temp(Src) 97.5 F (36.4 C) (Oral)  Resp 16  Ht 5\' 6"  (1.676 m)  Wt 220 lb (99.791 kg)  BMI 35.53 kg/m2  SpO2 97%  LMP 11/17/2014 Physical Exam  Constitutional: She is oriented to person, place, and time. She appears well-developed and well-nourished.  HENT:  Head: Normocephalic and atraumatic.  Eyes: Conjunctivae are  normal. Right eye exhibits no discharge. Left eye exhibits no discharge.  Neck: Normal range of motion. Neck supple.  Cardiovascular: Regular rhythm and normal heart sounds.   No murmur heard. Mildly tachycardic  Pulmonary/Chest: Effort normal and breath sounds normal. No respiratory distress. She has no wheezes. She has no rales.  Abdominal: Soft. There is no tenderness.  Musculoskeletal: Normal range of motion.  Able to move all extremities with baseline range of motion and without discomfort. No overt warmth, erythema or edema to any joints.  Neurological: She is alert and oriented to person, place, and time.  Skin: Skin is warm and dry.  Diffuse maculopapular rash to upper extremities, this is baseline for her. There is no purpura or petechial rash.  Psychiatric: She has a normal mood and affect.  Nursing note and vitals reviewed.   ED Course  Procedures (including critical care time)  COORDINATION OF CARE: 6:29 PM Discussed treatment plan with patient at beside, the patient agrees with the plan and has no further questions at this time.   Labs Review Labs Reviewed - No data to display  Imaging Review No results found.   EKG Interpretation None     Meds given in ED:  Medications  sodium chloride 0.9 % bolus 1,000 mL (0 mLs Intravenous Stopped 11/24/14 2054)  HYDROmorphone (DILAUDID) injection 1 mg (1 mg Intravenous Given 11/24/14 1853)  methylPREDNISolone sodium succinate (SOLU-MEDROL) 125 mg/2 mL injection 125 mg (125 mg Intravenous Given 11/24/14 1851)  ketorolac (TORADOL) 30 MG/ML injection 30 mg (30 mg Intravenous Given 11/24/14 2008)    Discharge Medication List as of 11/24/2014  8:36 PM     Filed Vitals:   11/24/14 1759 11/24/14 1953 11/24/14 2049  BP: 107/70 118/63 102/67  Pulse: 99 112 107  Temp: 98.4 F (36.9 C) 98.8 F (37.1 C) 97.5 F (36.4 C)  TempSrc: Oral Oral Oral  Resp: 18 16 16   Height: 5\' 6"  (1.676 m)    Weight: 220 lb (99.791 kg)    SpO2:   98% 97%    MDM  Vitals stable - improving in ED -afebrile Looking at previous visits, patient maintains a mild tachycardia. Pt resting comfortably in ED. Feels better after administration of IV fluids and pain medicines. Given 125 mg IV Solu-Medrol. Drinking and eating graham crackers in ED. PE--diffuse maculopapular rash to bilateral upper extremities, which is baseline for patient. No evidence of septic joint.  DDX--we'll discharge with prednisone and instructions to follow-up with rheumatologist for further medication management and evaluation of symptoms. No evidence of other acute or emergent pathology at this time.  I discussed all relevant lab findings and imaging results with pt and they verbalized understanding. Discussed f/u with PCP within 48 hrs and return precautions, pt very amenable to plan.  Final diagnoses:  Lupus  Pain    I personally performed the  services described in this documentation, which was scribed in my presence. The recorded information has been reviewed and is accurate.    Joycie Peek, PA-C 11/25/14 0149  Linwood Dibbles, MD 11/25/14 [phone]

## 2014-11-24 NOTE — ED Notes (Signed)
Patient denies pain and is resting comfortably.  

## 2014-11-24 NOTE — ED Notes (Signed)
Pt st's she is having a lupus flare up.  Pt c/o pain and not being able to eat.  Rash noted on bil arms

## 2015-03-25 ENCOUNTER — Observation Stay (HOSPITAL_COMMUNITY)
Admission: EM | Admit: 2015-03-25 | Discharge: 2015-03-27 | Disposition: A | Discharge status: Home / Self Care | Attending: Internal Medicine | Admitting: Internal Medicine

## 2015-03-25 ENCOUNTER — Emergency Department (HOSPITAL_COMMUNITY)

## 2015-03-25 ENCOUNTER — Encounter (HOSPITAL_COMMUNITY): Payer: Self-pay | Admitting: *Deleted

## 2015-03-25 DIAGNOSIS — L930 Discoid lupus erythematosus: Secondary | ICD-10-CM | POA: Diagnosis present

## 2015-03-25 DIAGNOSIS — G8929 Other chronic pain: Secondary | ICD-10-CM | POA: Diagnosis not present

## 2015-03-25 DIAGNOSIS — Z23 Encounter for immunization: Secondary | ICD-10-CM | POA: Insufficient documentation

## 2015-03-25 DIAGNOSIS — M329 Systemic lupus erythematosus, unspecified: Secondary | ICD-10-CM | POA: Diagnosis not present

## 2015-03-25 DIAGNOSIS — M791 Myalgia, unspecified site: Secondary | ICD-10-CM | POA: Diagnosis present

## 2015-03-25 DIAGNOSIS — I776 Arteritis, unspecified: Secondary | ICD-10-CM | POA: Diagnosis not present

## 2015-03-25 DIAGNOSIS — R000 Tachycardia, unspecified: Secondary | ICD-10-CM | POA: Diagnosis present

## 2015-03-25 DIAGNOSIS — M25559 Pain in unspecified hip: Secondary | ICD-10-CM | POA: Insufficient documentation

## 2015-03-25 DIAGNOSIS — Z86711 Personal history of pulmonary embolism: Secondary | ICD-10-CM | POA: Insufficient documentation

## 2015-03-25 DIAGNOSIS — K219 Gastro-esophageal reflux disease without esophagitis: Secondary | ICD-10-CM | POA: Diagnosis present

## 2015-03-25 DIAGNOSIS — M069 Rheumatoid arthritis, unspecified: Secondary | ICD-10-CM | POA: Insufficient documentation

## 2015-03-25 DIAGNOSIS — Z79899 Other long term (current) drug therapy: Secondary | ICD-10-CM | POA: Diagnosis not present

## 2015-03-25 DIAGNOSIS — M25551 Pain in right hip: Secondary | ICD-10-CM | POA: Diagnosis present

## 2015-03-25 DIAGNOSIS — Z7951 Long term (current) use of inhaled steroids: Secondary | ICD-10-CM | POA: Insufficient documentation

## 2015-03-25 DIAGNOSIS — I2699 Other pulmonary embolism without acute cor pulmonale: Secondary | ICD-10-CM

## 2015-03-25 DIAGNOSIS — I7300 Raynaud's syndrome without gangrene: Secondary | ICD-10-CM | POA: Diagnosis not present

## 2015-03-25 DIAGNOSIS — E876 Hypokalemia: Secondary | ICD-10-CM | POA: Diagnosis not present

## 2015-03-25 DIAGNOSIS — Z7952 Long term (current) use of systemic steroids: Secondary | ICD-10-CM | POA: Insufficient documentation

## 2015-03-25 DIAGNOSIS — IMO0002 Reserved for concepts with insufficient information to code with codable children: Secondary | ICD-10-CM

## 2015-03-25 DIAGNOSIS — M79645 Pain in left finger(s): Principal | ICD-10-CM | POA: Insufficient documentation

## 2015-03-25 LAB — CBC WITH DIFFERENTIAL/PLATELET
Basophils Absolute: 0 10*3/uL (ref 0.0–0.1)
Basophils Relative: 0 % (ref 0–1)
EOS ABS: 0.2 10*3/uL (ref 0.0–0.7)
Eosinophils Relative: 3 % (ref 0–5)
HCT: 43.6 % (ref 36.0–46.0)
Hemoglobin: 14.1 g/dL (ref 12.0–15.0)
LYMPHS PCT: 17 % (ref 12–46)
Lymphs Abs: 1.4 10*3/uL (ref 0.7–4.0)
MCH: 28.1 pg (ref 26.0–34.0)
MCHC: 32.3 g/dL (ref 30.0–36.0)
MCV: 87 fL (ref 78.0–100.0)
Monocytes Absolute: 0.7 10*3/uL (ref 0.1–1.0)
Monocytes Relative: 8 % (ref 3–12)
NEUTROS ABS: 5.9 10*3/uL (ref 1.7–7.7)
Neutrophils Relative %: 72 % (ref 43–77)
PLATELETS: 285 10*3/uL (ref 150–400)
RBC: 5.01 MIL/uL (ref 3.87–5.11)
RDW: 13.7 % (ref 11.5–15.5)
WBC: 8.1 10*3/uL (ref 4.0–10.5)

## 2015-03-25 LAB — BASIC METABOLIC PANEL
Anion gap: 10 (ref 5–15)
BUN: 8 mg/dL (ref 6–20)
CO2: 28 mmol/L (ref 22–32)
Calcium: 9 mg/dL (ref 8.9–10.3)
Chloride: 100 mmol/L — ABNORMAL LOW (ref 101–111)
Creatinine, Ser: 0.91 mg/dL (ref 0.44–1.00)
Glucose, Bld: 100 mg/dL — ABNORMAL HIGH (ref 65–99)
Potassium: 3.4 mmol/L — ABNORMAL LOW (ref 3.5–5.1)
SODIUM: 138 mmol/L (ref 135–145)

## 2015-03-25 LAB — I-STAT CG4 LACTIC ACID, ED
Lactic Acid, Venous: 1.66 mmol/L (ref 0.5–2.0)
Lactic Acid, Venous: 1.49 mmol/L (ref 0.5–2.0)

## 2015-03-25 MED ORDER — ONDANSETRON HCL 4 MG/2ML IJ SOLN
4.0000 mg | Freq: Once | INTRAMUSCULAR | Status: AC
  Administered 2015-03-25: 4 mg via INTRAVENOUS
  Filled 2015-03-25: qty 2

## 2015-03-25 MED ORDER — SODIUM CHLORIDE 0.9 % IV BOLUS (SEPSIS)
1000.0000 mL | Freq: Once | INTRAVENOUS | Status: AC
  Administered 2015-03-25: 1000 mL via INTRAVENOUS

## 2015-03-25 MED ORDER — MORPHINE SULFATE 4 MG/ML IJ SOLN
4.0000 mg | Freq: Once | INTRAMUSCULAR | Status: AC
  Administered 2015-03-25: 4 mg via INTRAVENOUS
  Filled 2015-03-25: qty 1

## 2015-03-25 MED ORDER — IOHEXOL 350 MG/ML SOLN
80.0000 mL | Freq: Once | INTRAVENOUS | Status: AC | PRN
  Administered 2015-03-25: 80 mL via INTRAVENOUS

## 2015-03-25 NOTE — ED Notes (Signed)
Pt reports having a lupus "flare" pt reports pain all over and general weakness and rt hip pain. Pt also reports discoloration to finger tips. Pt states that she was diagnosed with Raynauds and that her dr was going to prescribe medication but has not. Pt noted to have discoloration and wound under left pointer finger.

## 2015-03-25 NOTE — ED Provider Notes (Signed)
CSN: [id number]     Arrival date & time 03/25/15  1709 History   First MD Initiated Contact with Patient 03/25/15 2004     Chief Complaint  Patient presents with  . Lupus     (Consider location/radiation/quality/duration/timing/severity/associated sxs/prior Treatment) HPI Comments: Patient is a 39 year old female with a past medical history of lupus who presents with generalized pain that started 3 days ago. Symptoms started gradually and progressively worsened since the onset. The pain is aching and severe without radiation. She reports taking prednisone chronically for lupus but has not had it in 1 week which she thinks is contributing to her symptoms. She denies fever, chest pain, abdominal pain or any other associated symptoms. Patient is concerned about a wound on left index finger. Any movement makes her pain worse. No alleviating factors.    Past Medical History  Diagnosis Date  . Lupus   . Pulmonary embolism   . GERD (gastroesophageal reflux disease)   . Chronic pain    Past Surgical History  Procedure Laterality Date  . Pericardial window      in 2007   No family history on file. Social History  Substance Use Topics  . Smoking status: Never Smoker   . Smokeless tobacco: None  . Alcohol Use: No   OB History    No data available     Review of Systems  Constitutional: Negative for fever, chills and fatigue.  HENT: Negative for trouble swallowing.   Eyes: Negative for visual disturbance.  Respiratory: Negative for shortness of breath.   Cardiovascular: Negative for chest pain and palpitations.  Gastrointestinal: Negative for nausea, vomiting, abdominal pain and diarrhea.  Genitourinary: Negative for dysuria and difficulty urinating.  Musculoskeletal: Positive for myalgias. Negative for arthralgias and neck pain.  Skin: Positive for wound. Negative for color change.  Neurological: Negative for dizziness and weakness.  Psychiatric/Behavioral: Negative for dysphoric  mood.      Allergies  Imuran and Tramadol  Home Medications   Prior to Admission medications   Medication Sig Start Date End Date Taking? Authorizing Provider  Buprenorphine HCl-Naloxone HCl (SUBOXONE) 8-2 MG FILM Place 1 Film under the tongue 3 (three) times daily.   Yes Historical Provider, MD  esomeprazole (NEXIUM) 40 MG capsule Take 40 mg by mouth daily at 12 noon.   Yes Historical Provider, MD  fluticasone (FLONASE) 50 MCG/ACT nasal spray Place 1 spray into both nostrils daily as needed for allergies or rhinitis.   Yes Historical Provider, MD  hydroxychloroquine (PLAQUENIL) 200 MG tablet Take 200 mg by mouth 2 (two) times daily.    Yes Historical Provider, MD  lactose free nutrition (BOOST) LIQD Take 237 mLs by mouth daily.   Yes Historical Provider, MD  mycophenolate (CELLCEPT) 500 MG tablet Take 1,500 mg by mouth 2 (two) times daily.    Yes Historical Provider, MD  predniSONE (DELTASONE) 20 MG tablet Take 1 tablet (20 mg total) by mouth daily. 11/24/14  Yes Joycie Peek, PA-C  feeding supplement (ENSURE COMPLETE) LIQD Take 237 mLs by mouth daily as needed (per pt request, likes vanilla). Patient not taking: Reported on 03/25/2015 07/28/12   Leroy Sea, MD   BP 108/73 mmHg  Pulse 119  Temp(Src) 99.2 F (37.3 C) (Oral)  Resp 18  Ht 5\' 7"  (1.702 m)  Wt 220 lb (99.791 kg)  BMI 34.45 kg/m2  SpO2 98%  LMP 03/25/2015 Physical Exam  Constitutional: She is oriented to person, place, and time. She appears well-developed and  well-nourished. No distress.  HENT:  Head: Normocephalic and atraumatic.  Eyes: Conjunctivae and EOM are normal.  Neck: Normal range of motion.  Cardiovascular: Normal rate and regular rhythm.  Exam reveals no gallop and no friction rub.   No murmur heard. Pulmonary/Chest: Effort normal and breath sounds normal. She has no wheezes. She has no rales. She exhibits no tenderness.  Abdominal: Soft. She exhibits no distension. There is no tenderness. There  is no rebound.  Musculoskeletal: Normal range of motion.  Neurological: She is alert and oriented to person, place, and time. Coordination normal.  Speech is goal-oriented. Moves limbs without ataxia.   Skin: Skin is warm and dry.  Psychiatric: She has a normal mood and affect. Her behavior is normal.  Nursing note and vitals reviewed.   ED Course  Procedures (including critical care time) Labs Review Labs Reviewed  BASIC METABOLIC PANEL - Abnormal; Notable for the following:    Potassium 3.4 (*)    Chloride 100 (*)    Glucose, Bld 100 (*)    All other components within normal limits  CBC WITH DIFFERENTIAL/PLATELET  I-STAT CG4 LACTIC ACID, ED  I-STAT CG4 LACTIC ACID, ED    Imaging Review Dg Chest 2 View  03/25/2015   CLINICAL DATA:  Tachycardia  EXAM: CHEST - 2 VIEW  COMPARISON:  03/14/2014  FINDINGS: Cardiac shadow is mildly enlarged but stable. Patchy fibrotic changes are noted throughout both lungs stable from previous exam. No focal infiltrate or sizable effusion is seen. No acute bony abnormality is noted.  IMPRESSION: No change from the prior CT examination. Chronic changes in both lungs are again seen.   Electronically Signed   By: Alcide Clever M.D.   On: 03/25/2015 21:51   Ct Angio Chest Pe W/cm &/or Wo Cm  03/26/2015   CLINICAL DATA:  Acute onset of tachycardia. Lupus flare up. Initial encounter.  EXAM: CT ANGIOGRAPHY CHEST WITH CONTRAST  TECHNIQUE: Multidetector CT imaging of the chest was performed using the standard protocol during bolus administration of intravenous contrast. Multiplanar CT image reconstructions and MIPs were obtained to evaluate the vascular anatomy.  CONTRAST:  72mL OMNIPAQUE IOHEXOL 350 MG/ML SOLN  COMPARISON:  Chest radiograph performed earlier today at 9:27 p.m., and CTA of the chest performed 03/14/2014  FINDINGS: There is no evidence of pulmonary embolus.  Diffuse peripheral and bibasilar pulmonary fibrosis and interstitial lung disease appear perhaps  mildly worsened from 2015, particularly at the right lung base. Underlying peripheral honeycombing is relatively stable. No superimposed focal airspace consolidation is seen. No pleural effusion or pneumothorax is identified. No masses are identified; no abnormal focal contrast enhancement is seen.  A single 1.4 cm right hilar node may be related to the patient's underlying systemic process, and is stable from the prior study. No pericardial effusion is identified. The great vessels are grossly unremarkable in appearance. The esophagus is partially filled with fluid and air, raising question for mild dysmotility. No axillary lymphadenopathy is seen. The visualized portions of the thyroid gland are unremarkable in appearance.  The visualized portions of the liver and spleen are unremarkable.  No acute osseous abnormalities are seen.  Review of the MIP images confirms the above findings.  IMPRESSION: 1. No evidence of pulmonary embolus. 2. Diffuse peripheral and bibasilar pulmonary fibrosis and interstitial lung disease appear perhaps mildly worsened from 2015, particularly at the right lung base. Underlying stable peripheral honeycombing. No superimposed focal airspace consolidation seen. 3. Stable prominent right hilar node, measuring 1.4 cm in  short axis. 4. Esophagus is partially filled with fluid and air, raising question for mild dysmotility.   Electronically Signed   By: Roanna Raider M.D.   On: 03/26/2015 00:29   I, Emilia Beck, personally reviewed and evaluated these images and lab results as part of my medical decision-making.   EKG Interpretation   Date/Time:  Sunday March 25 2015 17:57:58 EDT Ventricular Rate:  121 PR Interval:  142 QRS Duration: 74 QT Interval:  330 QTC Calculation: 468 R Axis:   18 Text Interpretation:  Sinus tachycardia Biatrial enlargement Septal  infarct , age undetermined  Noted previously 03/2014 Abnormal ECG Confirmed  by Fayrene Fearing  MD, MARK ([id number]) on 03/25/2015  6:00:22 PM      MDM   Final diagnoses:  Myalgia  Tachycardia  Exacerbation of systemic lupus  Lupus  Pulmonary embolism    12:36 AM Patient's is persistently tachycardic. Labs unremarkable for acute changes. CT angio shows no acute PE but show pulmonary fibrosis and interstitial lung disease.   Patient will be admitted for pain control and persistent tachycardia.  Emilia Beck, PA-C 03/26/15 0502  Gerhard Munch, MD 03/29/15 [phone]

## 2015-03-26 ENCOUNTER — Encounter (HOSPITAL_COMMUNITY): Payer: Self-pay | Admitting: Internal Medicine

## 2015-03-26 DIAGNOSIS — IMO0002 Reserved for concepts with insufficient information to code with codable children: Secondary | ICD-10-CM | POA: Insufficient documentation

## 2015-03-26 DIAGNOSIS — I2699 Other pulmonary embolism without acute cor pulmonale: Secondary | ICD-10-CM | POA: Insufficient documentation

## 2015-03-26 DIAGNOSIS — M25551 Pain in right hip: Secondary | ICD-10-CM | POA: Diagnosis present

## 2015-03-26 DIAGNOSIS — R000 Tachycardia, unspecified: Secondary | ICD-10-CM

## 2015-03-26 DIAGNOSIS — K219 Gastro-esophageal reflux disease without esophagitis: Secondary | ICD-10-CM

## 2015-03-26 DIAGNOSIS — L089 Local infection of the skin and subcutaneous tissue, unspecified: Secondary | ICD-10-CM | POA: Diagnosis not present

## 2015-03-26 DIAGNOSIS — L930 Discoid lupus erythematosus: Secondary | ICD-10-CM | POA: Diagnosis not present

## 2015-03-26 DIAGNOSIS — M791 Myalgia, unspecified site: Secondary | ICD-10-CM | POA: Diagnosis present

## 2015-03-26 DIAGNOSIS — M329 Systemic lupus erythematosus, unspecified: Secondary | ICD-10-CM | POA: Insufficient documentation

## 2015-03-26 LAB — CORTISOL-AM, BLOOD: CORTISOL - AM: 3.2 ug/dL — AB (ref 6.7–22.6)

## 2015-03-26 LAB — COMPREHENSIVE METABOLIC PANEL
ALT: 15 U/L (ref 14–54)
AST: 32 U/L (ref 15–41)
Albumin: 3.1 g/dL — ABNORMAL LOW (ref 3.5–5.0)
Alkaline Phosphatase: 38 U/L (ref 38–126)
Anion gap: 10 (ref 5–15)
BUN: 8 mg/dL (ref 6–20)
CHLORIDE: 101 mmol/L (ref 101–111)
CO2: 26 mmol/L (ref 22–32)
CREATININE: 0.74 mg/dL (ref 0.44–1.00)
Calcium: 8.4 mg/dL — ABNORMAL LOW (ref 8.9–10.3)
GFR calc Af Amer: >60 mL/min (ref >60)
GFR calc non Af Amer: >60 mL/min (ref >60)
GLUCOSE: 140 mg/dL — AB (ref 65–99)
Potassium: 4 mmol/L (ref 3.5–5.1)
SODIUM: 137 mmol/L (ref 135–145)
Total Bilirubin: 0.7 mg/dL (ref 0.3–1.2)
Total Protein: 8.2 g/dL — ABNORMAL HIGH (ref 6.5–8.1)

## 2015-03-26 LAB — PROTIME-INR
INR: 1.15 (ref 0.00–1.49)
Prothrombin Time: 14.9 seconds (ref 11.6–15.2)

## 2015-03-26 LAB — CBC
HCT: 38.3 % (ref 36.0–46.0)
Hemoglobin: 12.3 g/dL (ref 12.0–15.0)
MCH: 28 pg (ref 26.0–34.0)
MCHC: 32.1 g/dL (ref 30.0–36.0)
MCV: 87 fL (ref 78.0–100.0)
PLATELETS: 258 10*3/uL (ref 150–400)
RBC: 4.4 MIL/uL (ref 3.87–5.11)
RDW: 13.6 % (ref 11.5–15.5)
WBC: 5.3 10*3/uL (ref 4.0–10.5)

## 2015-03-26 LAB — HEPATIC FUNCTION PANEL
ALBUMIN: 3.2 g/dL — AB (ref 3.5–5.0)
ALT: 15 U/L (ref 14–54)
AST: 32 U/L (ref 15–41)
Alkaline Phosphatase: 39 U/L (ref 38–126)
BILIRUBIN TOTAL: 0.6 mg/dL (ref 0.3–1.2)
Bilirubin, Direct: 0.2 mg/dL (ref 0.1–0.5)
Indirect Bilirubin: 0.4 mg/dL (ref 0.3–0.9)
Total Protein: 8.1 g/dL (ref 6.5–8.1)

## 2015-03-26 LAB — APTT: APTT: 28 s (ref 24–37)

## 2015-03-26 LAB — C-REACTIVE PROTEIN: CRP: 7.3 mg/dL — AB (ref <1.0)

## 2015-03-26 LAB — PROCALCITONIN: Procalcitonin: <0.1 ng/mL

## 2015-03-26 LAB — LACTIC ACID, PLASMA: Lactic Acid, Venous: 1.2 mmol/L (ref 0.5–2.0)

## 2015-03-26 LAB — SEDIMENTATION RATE: Sed Rate: 26 mm/hr — ABNORMAL HIGH (ref 0–22)

## 2015-03-26 MED ORDER — ACETAMINOPHEN 650 MG RE SUPP: 650.0000 mg | Freq: Four times a day (QID) | RECTAL | Status: DC | PRN

## 2015-03-26 MED ORDER — MYCOPHENOLATE MOFETIL 250 MG PO CAPS
1500.0000 mg | ORAL_CAPSULE | Freq: Two times a day (BID) | ORAL | Status: DC
  Administered 2015-03-26 – 2015-03-27 (×4): 1500 mg via ORAL
  Filled 2015-03-26 (×5): qty 6

## 2015-03-26 MED ORDER — ACETAMINOPHEN 325 MG PO TABS: 650.0000 mg | ORAL_TABLET | Freq: Four times a day (QID) | ORAL | Status: DC | PRN

## 2015-03-26 MED ORDER — MYCOPHENOLATE MOFETIL 500 MG PO TABS: 1500.0000 mg | ORAL_TABLET | Freq: Two times a day (BID) | ORAL | Status: DC

## 2015-03-26 MED ORDER — FLUTICASONE PROPIONATE 50 MCG/ACT NA SUSP: 1.0000 | Freq: Every day | NASAL | Status: DC | PRN

## 2015-03-26 MED ORDER — MORPHINE SULFATE 4 MG/ML IJ SOLN
2.0000 mg | INTRAMUSCULAR | Status: DC | PRN
  Administered 2015-03-26 – 2015-03-27 (×5): 2 mg via INTRAVENOUS
  Filled 2015-03-26 (×6): qty 1

## 2015-03-26 MED ORDER — HEPARIN SODIUM (PORCINE) 5000 UNIT/ML IJ SOLN
5000.0000 [IU] | Freq: Three times a day (TID) | INTRAMUSCULAR | Status: DC
  Administered 2015-03-26: 5000 [IU] via SUBCUTANEOUS
  Filled 2015-03-26 (×4): qty 1

## 2015-03-26 MED ORDER — HYDROMORPHONE HCL 1 MG/ML IJ SOLN: 1.0000 mg | INTRAMUSCULAR | Status: AC | PRN

## 2015-03-26 MED ORDER — PANTOPRAZOLE SODIUM 40 MG PO TBEC
40.0000 mg | DELAYED_RELEASE_TABLET | Freq: Every day | ORAL | Status: DC
  Administered 2015-03-26 – 2015-03-27 (×2): 40 mg via ORAL
  Filled 2015-03-26 (×2): qty 1

## 2015-03-26 MED ORDER — OXYCODONE-ACETAMINOPHEN 5-325 MG PO TABS
2.0000 | ORAL_TABLET | Freq: Four times a day (QID) | ORAL | Status: DC | PRN
  Administered 2015-03-26 – 2015-03-27 (×4): 2 via ORAL
  Filled 2015-03-26 (×5): qty 2

## 2015-03-26 MED ORDER — DOXYCYCLINE HYCLATE 100 MG PO TABS
100.0000 mg | ORAL_TABLET | Freq: Two times a day (BID) | ORAL | Status: DC
  Administered 2015-03-26 – 2015-03-27 (×4): 100 mg via ORAL
  Filled 2015-03-26 (×5): qty 1

## 2015-03-26 MED ORDER — SODIUM CHLORIDE 0.9 % IV SOLN
INTRAVENOUS | Status: DC
  Administered 2015-03-26: 03:00:00 via INTRAVENOUS

## 2015-03-26 MED ORDER — HEPARIN SODIUM (PORCINE) 5000 UNIT/ML IJ SOLN
5000.0000 [IU] | Freq: Three times a day (TID) | INTRAMUSCULAR | Status: DC
  Administered 2015-03-26 – 2015-03-27 (×3): 5000 [IU] via SUBCUTANEOUS
  Filled 2015-03-26 (×4): qty 1

## 2015-03-26 MED ORDER — BOOST PO LIQD
237.0000 mL | Freq: Every day | ORAL | Status: DC
  Administered 2015-03-27: 237 mL via ORAL
  Filled 2015-03-26 (×3): qty 237

## 2015-03-26 MED ORDER — AMLODIPINE BESYLATE 10 MG PO TABS
10.0000 mg | ORAL_TABLET | Freq: Every day | ORAL | Status: DC
  Administered 2015-03-26 – 2015-03-27 (×2): 10 mg via ORAL
  Filled 2015-03-26 (×3): qty 1

## 2015-03-26 MED ORDER — HYDROMORPHONE HCL 1 MG/ML IJ SOLN
1.0000 mg | Freq: Once | INTRAMUSCULAR | Status: AC
  Administered 2015-03-26: 1 mg via INTRAVENOUS
  Filled 2015-03-26: qty 1

## 2015-03-26 MED ORDER — PNEUMOCOCCAL VAC POLYVALENT 25 MCG/0.5ML IJ INJ
0.5000 mL | INJECTION | INTRAMUSCULAR | Status: AC
  Administered 2015-03-27: 0.5 mL via INTRAMUSCULAR
  Filled 2015-03-26: qty 0.5

## 2015-03-26 MED ORDER — HYDROXYCHLOROQUINE SULFATE 200 MG PO TABS
200.0000 mg | ORAL_TABLET | Freq: Two times a day (BID) | ORAL | Status: DC
  Administered 2015-03-26 – 2015-03-27 (×3): 200 mg via ORAL
  Filled 2015-03-26 (×4): qty 1

## 2015-03-26 MED ORDER — PREDNISONE 20 MG PO TABS
20.0000 mg | ORAL_TABLET | Freq: Every day | ORAL | Status: DC
  Administered 2015-03-26 – 2015-03-27 (×2): 20 mg via ORAL
  Filled 2015-03-26 (×2): qty 1

## 2015-03-26 MED ORDER — METHYLPREDNISOLONE SODIUM SUCC 125 MG IJ SOLR
125.0000 mg | Freq: Once | INTRAMUSCULAR | Status: AC
  Administered 2015-03-26: 125 mg via INTRAVENOUS
  Filled 2015-03-26: qty 2

## 2015-03-26 MED ORDER — BUPRENORPHINE HCL 2 MG SL SUBL
8.0000 mg | SUBLINGUAL_TABLET | Freq: Three times a day (TID) | SUBLINGUAL | Status: DC
  Administered 2015-03-26 – 2015-03-27 (×4): 8 mg via SUBLINGUAL
  Filled 2015-03-26 (×4): qty 4

## 2015-03-26 MED ORDER — HYDROMORPHONE HCL 1 MG/ML IJ SOLN: 0.5000 mg | INTRAMUSCULAR | Status: AC | PRN

## 2015-03-26 MED ORDER — ONDANSETRON HCL 4 MG/2ML IJ SOLN: 4.0000 mg | Freq: Three times a day (TID) | INTRAMUSCULAR | Status: AC | PRN

## 2015-03-26 MED ORDER — SODIUM CHLORIDE 0.9 % IJ SOLN: 3.0000 mL | Freq: Two times a day (BID) | INTRAMUSCULAR | Status: DC

## 2015-03-26 MED ORDER — BUPRENORPHINE HCL-NALOXONE HCL 8-2 MG SL FILM: 1.0000 | ORAL_FILM | Freq: Three times a day (TID) | SUBLINGUAL | Status: DC

## 2015-03-26 NOTE — H&P (Signed)
[facility] History and Physical  AMEN DARGIS MWN:[id number] DOB: [birth year] DOA: 03/25/2015  Referring physician: ED physician PCP: Maximino Greenland, MD  Specialists:   Chief Complaint: Left index finger pain, right hip pain and generalized pain.  HPI: Elizabeth Baxter is a 39 y.o. female with PMH of lupus, GERD, PE, chronic pain, colitis, pancreatitis, Raynaud syndrome, who presents with left index finger pain, right hip pain and generalized pain.  Patient reports that in the past 3 days, she has been having worsening pain over left index finger tip, right hip and generalized pain. She states that when she had lupus flareup in the past, it always triggered her joint pain, such as pain in shoulders and the hips. She reports that she was told by her rheumatologist that she has Raynaud's syndrome, which causes her finger pain. In the past 3 days, her left index finger tip seems to be infected, and the pain over left index finger tip has been progressively getting worse. She saw some yellow color pus drainage in the left index fingertip. She reports that she has been taking Plaquenil, CellCept and 20 mg of prednisone daily normally for controlling her lupus. In the past 3 days, she has been taking 10 mg of prednisone instead of 20 mg. She has subjective fever and chills. Patient does not have chest pain or shortness of breath, cough, abdominal pain, diarrhea, unilateral weakness. She has lupus rash chronically in multiple areas of her body.  In ED, patient was found to have WBC 8.1, temperature 99.2, tachycardia, lactate 1.66, potassium 3.4, renal function okay, chest x-ray is negative for acute abnormalities. CT angiogram of the chest is negative for pulmonary embolism, but showed bilateral pulmonary fibrosis.   Where does patient live?   At home    Can patient participate in ADLs?  Yes   Review of Systems:   General: has fevers, chills, no changes in body weight, has poor appetite, has  fatigue HEENT: no blurry vision, hearing changes or sore throat Pulm: no dyspnea, coughing, wheezing CV: no chest pain, palpitations Abd: no nausea, vomiting, abdominal pain, diarrhea, constipation GU: no dysuria, burning on urination, increased urinary frequency, hematuria  Ext: no leg edema. Has left index finger tip pain and infection. Neuro: no unilateral weakness, numbness, or tingling, no vision change or hearing loss. Has right hip pain. Skin: no rash MSK: No muscle spasm, no deformity, no limitation of range of movement in spin Heme: No easy bruising.  Travel history: No recent long distant travel.  Allergy:  Allergies  Allergen Reactions  . Imuran [Azathioprine] Other (See Comments)    Ears burned  . Tramadol Other (See Comments)    Burned ears    Past Medical History  Diagnosis Date  . Lupus   . Pulmonary embolism   . GERD (gastroesophageal reflux disease)   . Chronic pain     Past Surgical History  Procedure Laterality Date  . Pericardial window      in 2007    Social History:  reports that she has never smoked. She does not have any smokeless tobacco history on file. She reports that she does not drink alcohol or use illicit drugs.  Family History:  Family History  Problem Relation Age of Onset  . Diabetes Mother      Prior to Admission medications   Medication Sig Start Date End Date Taking? Authorizing Provider  Buprenorphine HCl-Naloxone HCl (SUBOXONE) 8-2 MG FILM Place 1 Film under the tongue 3 (three) times  daily.   Yes Historical Provider, MD  esomeprazole (NEXIUM) 40 MG capsule Take 40 mg by mouth daily at 12 noon.   Yes Historical Provider, MD  fluticasone (FLONASE) 50 MCG/ACT nasal spray Place 1 spray into both nostrils daily as needed for allergies or rhinitis.   Yes Historical Provider, MD  hydroxychloroquine (PLAQUENIL) 200 MG tablet Take 200 mg by mouth 2 (two) times daily.    Yes Historical Provider, MD  lactose free nutrition (BOOST) LIQD  Take 237 mLs by mouth daily.   Yes Historical Provider, MD  mycophenolate (CELLCEPT) 500 MG tablet Take 1,500 mg by mouth 2 (two) times daily.    Yes Historical Provider, MD  predniSONE (DELTASONE) 20 MG tablet Take 1 tablet (20 mg total) by mouth daily. 11/24/14  Yes Comer Locket, PA-C  feeding supplement (ENSURE COMPLETE) LIQD Take 237 mLs by mouth daily as needed (per pt request, likes vanilla). Patient not taking: Reported on 03/25/2015 07/28/12   Thurnell Lose, MD    Physical Exam: Filed Vitals:   03/26/15 0015 03/26/15 0045 03/26/15 0255 03/26/15 0439  BP: 106/65 102/67 95/70 117/70  Pulse: 111 110 99 89  Temp:   98.3 F (36.8 C) 98.2 F (36.8 C)  TempSrc:   Oral Oral  Resp: '22 27 20 18  ' Height:      Weight:   99.2 kg (218 lb 11.1 oz)   SpO2: 95% 95% 100% 95%   General: Not in acute distress HEENT:       Eyes: PERRL, EOMI, no scleral icterus.       ENT: No discharge from the ears and nose, no pharynx injection, no tonsillar enlargement.        Neck: No JVD, no bruit, no mass felt. Heme: No neck lymph node enlargement. Cardiac: S1/S2, RRR, No murmurs, No gallops or rubs. Pulm: No rales, wheezing, rhonchi or rubs. Abd: Soft, nondistended, nontender, no rebound pain, no organomegaly, BS present. Ext: No pitting leg edema bilaterally. 2+DP/PT pulse bilaterally. Left index finger tip seems to be infected, with severe pain, and black necrotic materials under the finger nail. Seem to have abscess formation in the finger tip, but patient refused more detailed exam due to severe pain. Musculoskeletal: there is severe tenderness over Right hip, without redness or swelling.  Skin: No rashes.  Neuro: Alert, oriented X3, cranial nerves II-XII grossly intact, muscle strength 5/5 in all extremities, sensation to light touch intact.  Psych: Patient is not psychotic, no suicidal or hemocidal ideation.  Labs on Admission:  Basic Metabolic Panel:  Recent Labs Lab 03/25/15 1804  NA  138  K 3.4*  CL 100*  CO2 28  GLUCOSE 100*  BUN 8  CREATININE 0.91  CALCIUM 9.0   Liver Function Tests: No results for input(s): AST, ALT, ALKPHOS, BILITOT, PROT, ALBUMIN in the last 168 hours. No results for input(s): LIPASE, AMYLASE in the last 168 hours. No results for input(s): AMMONIA in the last 168 hours. CBC:  Recent Labs Lab 03/25/15 1804 03/26/15 0530  WBC 8.1 5.3  NEUTROABS 5.9  --   HGB 14.1 12.3  HCT 43.6 38.3  MCV 87.0 87.0  PLT 285 258   Cardiac Enzymes: No results for input(s): CKTOTAL, CKMB, CKMBINDEX, TROPONINI in the last 168 hours.  BNP (last 3 results) No results for input(s): BNP in the last 8760 hours.  ProBNP (last 3 results) No results for input(s): PROBNP in the last 8760 hours.  CBG: No results for input(s): GLUCAP in the  last 168 hours.  Radiological Exams on Admission: Dg Chest 2 View  03/25/2015   CLINICAL DATA:  Tachycardia  EXAM: CHEST - 2 VIEW  COMPARISON:  03/14/2014  FINDINGS: Cardiac shadow is mildly enlarged but stable. Patchy fibrotic changes are noted throughout both lungs stable from previous exam. No focal infiltrate or sizable effusion is seen. No acute bony abnormality is noted.  IMPRESSION: No change from the prior CT examination. Chronic changes in both lungs are again seen.   Electronically Signed   By: Inez Catalina M.D.   On: 03/25/2015 21:51   Ct Angio Chest Pe W/cm &/or Wo Cm  03/26/2015   CLINICAL DATA:  Acute onset of tachycardia. Lupus flare up. Initial encounter.  EXAM: CT ANGIOGRAPHY CHEST WITH CONTRAST  TECHNIQUE: Multidetector CT imaging of the chest was performed using the standard protocol during bolus administration of intravenous contrast. Multiplanar CT image reconstructions and MIPs were obtained to evaluate the vascular anatomy.  CONTRAST:  39m OMNIPAQUE IOHEXOL 350 MG/ML SOLN  COMPARISON:  Chest radiograph performed earlier today at 9:27 p.m., and CTA of the chest performed 03/14/2014  FINDINGS: There is no  evidence of pulmonary embolus.  Diffuse peripheral and bibasilar pulmonary fibrosis and interstitial lung disease appear perhaps mildly worsened from 2015, particularly at the right lung base. Underlying peripheral honeycombing is relatively stable. No superimposed focal airspace consolidation is seen. No pleural effusion or pneumothorax is identified. No masses are identified; no abnormal focal contrast enhancement is seen.  A single 1.4 cm right hilar node may be related to the patient's underlying systemic process, and is stable from the prior study. No pericardial effusion is identified. The great vessels are grossly unremarkable in appearance. The esophagus is partially filled with fluid and air, raising question for mild dysmotility. No axillary lymphadenopathy is seen. The visualized portions of the thyroid gland are unremarkable in appearance.  The visualized portions of the liver and spleen are unremarkable.  No acute osseous abnormalities are seen.  Review of the MIP images confirms the above findings.  IMPRESSION: 1. No evidence of pulmonary embolus. 2. Diffuse peripheral and bibasilar pulmonary fibrosis and interstitial lung disease appear perhaps mildly worsened from 2015, particularly at the right lung base. Underlying stable peripheral honeycombing. No superimposed focal airspace consolidation seen. 3. Stable prominent right hilar node, measuring 1.4 cm in short axis. 4. Esophagus is partially filled with fluid and air, raising question for mild dysmotility.   Electronically Signed   By: JGarald BaldingM.D.   On: 03/26/2015 00:29    EKG: Independently reviewed.  Abnormal findings: QTC 468, tachycardia, bilateral atrial enlargement, T-wave inversion in lead III/aVF and V3-V4  Assessment/Plan Principal Problem:   Infection of index finger Active Problems:   Chronic pain   GERD (gastroesophageal reflux disease)   Hypokalemia   Lupus erythematosus   Myalgia   Right hip pain    Tachycardia  Infection of index finger: Seem to have abscess formation in the finger tip, but patient refused more detailed exam due to severe pain. Patient is not septic on admission. -will admit to tele bed given tachycardia -start doxycycline -consult to Wound care team -When necessary Percocet and morphine for pain and Zofran for nausea -will get Procalcitonin and trend lactic acid levels  -IVF: 1L of NS bolus in ED, followed by 100 cc/h -blood culture x 2  Lupus: Not sure whether pt has lupus flare up. She has severe pain over right hip. -will check inflammation marker, ESR and CRP first,  if elevated significantly, will start high dose steroid -will resume her normal dose of prednisone, 20 mg daily now -125 mg of solumedrol was given by ED -check cortisol level -continue Plaquenil and CellCept -check LFT -CT-R hip  GERD: -Protonix  Mild Hypokalemia: -follow up BMP  Tachycardia: Likely due to pain. No PE on CTA.  -tele monitoring -pain control  DVT ppx: SQ Heparin   Code Status: Full code Family Communication: None at bed side.    Disposition Plan: Admit to inpatient   Date of Service 03/26/2015    Ivor Costa [facility] Pager [phone]  If 7PM-7AM, please contact night-coverage [email or web address] Password [email or web address] 03/26/2015, 6:18 AM

## 2015-03-26 NOTE — Progress Notes (Signed)
Utilization review completed.  

## 2015-03-26 NOTE — Progress Notes (Signed)
Pt seen and examined, admitted earlier this am per Dr.Niu 39/F with RA on immunomodulators(cellcept and prednisone), h/o Reynauds disease, just started seeing new Rheum at [facility], presented to ER with L index finer pain, discoloration and swelling of nail bed and intermittent sero-purulent drainage from tip of finger No fever, leukocytosis, lactic acid WNL, but CRP up Now on Doxycycline, ESR only 36 Will ask Hand Surgery to evaluate, keep NPO, DC Hep [place], [place]

## 2015-03-26 NOTE — Consult Note (Signed)
ORTHOPAEDIC CONSULTATION HISTORY & PHYSICAL REQUESTING PHYSICIAN: Zannie Cove, MD  Chief Complaint: Bilateral index finger wounds  HPI: Elizabeth Baxter is a 39 y.o. female who has a history of Raynaud's and lupus, presently somewhat pharmacologically immuno-compromised, and presented to the emergency department with a painful flare in multiple body regions. In addition was noted that she had some crusting necrosis at the distal aspect of the junction between the nailbed and nail, worse on the left index finger than the right. She reports that on the left side she seen a little bit of scant spotting within the last couple days, but now it is dried up. Both fingers are painful  Past Medical History  Diagnosis Date  . Lupus   . Pulmonary embolism   . GERD (gastroesophageal reflux disease)   . Chronic pain    Past Surgical History  Procedure Laterality Date  . Pericardial window      in 2007   Social History   Social History  . Marital Status: Married    Spouse Name: N/A  . Number of Children: N/A  . Years of Education: N/A   Social History Main Topics  . Smoking status: Never Smoker   . Smokeless tobacco: None  . Alcohol Use: No  . Drug Use: No     Comment: no IV Drug use in past  . Sexual Activity: Yes   Other Topics Concern  . None   Social History Narrative   Lives at home with husband and 5 yo daughter         Family History  Problem Relation Age of Onset  . Diabetes Mother    Allergies  Allergen Reactions  . Imuran [Azathioprine] Other (See Comments)    Ears burned  . Tramadol Other (See Comments)    Burned ears   Prior to Admission medications   Medication Sig Start Date End Date Taking? Authorizing Provider  Buprenorphine HCl-Naloxone HCl (SUBOXONE) 8-2 MG FILM Place 1 Film under the tongue 3 (three) times daily.   Yes Historical Provider, MD  esomeprazole (NEXIUM) 40 MG capsule Take 40 mg by mouth daily at 12 noon.   Yes Historical Provider, MD    fluticasone (FLONASE) 50 MCG/ACT nasal spray Place 1 spray into both nostrils daily as needed for allergies or rhinitis.   Yes Historical Provider, MD  hydroxychloroquine (PLAQUENIL) 200 MG tablet Take 200 mg by mouth 2 (two) times daily.    Yes Historical Provider, MD  lactose free nutrition (BOOST) LIQD Take 237 mLs by mouth daily.   Yes Historical Provider, MD  mycophenolate (CELLCEPT) 500 MG tablet Take 1,500 mg by mouth 2 (two) times daily.    Yes Historical Provider, MD  predniSONE (DELTASONE) 20 MG tablet Take 1 tablet (20 mg total) by mouth daily. 11/24/14  Yes Joycie Peek, PA-C  feeding supplement (ENSURE COMPLETE) LIQD Take 237 mLs by mouth daily as needed (per pt request, likes vanilla). Patient not taking: Reported on 03/25/2015 07/28/12   Leroy Sea, MD   Dg Chest 2 View  03/25/2015   CLINICAL DATA:  Tachycardia  EXAM: CHEST - 2 VIEW  COMPARISON:  03/14/2014  FINDINGS: Cardiac shadow is mildly enlarged but stable. Patchy fibrotic changes are noted throughout both lungs stable from previous exam. No focal infiltrate or sizable effusion is seen. No acute bony abnormality is noted.  IMPRESSION: No change from the prior CT examination. Chronic changes in both lungs are again seen.   Electronically Signed   By: Loraine Leriche  Lukens M.D.   On: 03/25/2015 21:51   Ct Angio Chest Pe W/cm &/or Wo Cm  03/26/2015   CLINICAL DATA:  Acute onset of tachycardia. Lupus flare up. Initial encounter.  EXAM: CT ANGIOGRAPHY CHEST WITH CONTRAST  TECHNIQUE: Multidetector CT imaging of the chest was performed using the standard protocol during bolus administration of intravenous contrast. Multiplanar CT image reconstructions and MIPs were obtained to evaluate the vascular anatomy.  CONTRAST:  88mL OMNIPAQUE IOHEXOL 350 MG/ML SOLN  COMPARISON:  Chest radiograph performed earlier today at 9:27 p.m., and CTA of the chest performed 03/14/2014  FINDINGS: There is no evidence of pulmonary embolus.  Diffuse peripheral  and bibasilar pulmonary fibrosis and interstitial lung disease appear perhaps mildly worsened from 2015, particularly at the right lung base. Underlying peripheral honeycombing is relatively stable. No superimposed focal airspace consolidation is seen. No pleural effusion or pneumothorax is identified. No masses are identified; no abnormal focal contrast enhancement is seen.  A single 1.4 cm right hilar node may be related to the patient's underlying systemic process, and is stable from the prior study. No pericardial effusion is identified. The great vessels are grossly unremarkable in appearance. The esophagus is partially filled with fluid and air, raising question for mild dysmotility. No axillary lymphadenopathy is seen. The visualized portions of the thyroid gland are unremarkable in appearance.  The visualized portions of the liver and spleen are unremarkable.  No acute osseous abnormalities are seen.  Review of the MIP images confirms the above findings.  IMPRESSION: 1. No evidence of pulmonary embolus. 2. Diffuse peripheral and bibasilar pulmonary fibrosis and interstitial lung disease appear perhaps mildly worsened from 2015, particularly at the right lung base. Underlying stable peripheral honeycombing. No superimposed focal airspace consolidation seen. 3. Stable prominent right hilar node, measuring 1.4 cm in short axis. 4. Esophagus is partially filled with fluid and air, raising question for mild dysmotility.   Electronically Signed   By: Roanna Raider M.D.   On: 03/26/2015 00:29    Positive ROS: All other systems have been reviewed and were otherwise negative with the exception of those mentioned in the HPI and as above.  Physical Exam: Vitals: Refer to EMR. Constitutional:  WD, WN, NAD HEENT:  NCAT, EOMI Neuro/Psych:  Alert & oriented to person, place, and time; appropriate mood & affect Lymphatic: No generalized extremity edema or lymphadenopathy Extremities / MSK:  The extremities are  normal with respect to appearance, ranges of motion, joint stability, muscle strength/tone, sensation, & perfusion except as otherwise noted:  The left index finger has some dried drainage versus very distal tip necrosis distal aspect of the nail plate. The pulp is not tense, it is tender to palpation, and there is no expressible drainage. The right side a similar but to a lesser degree. 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 If the necrosis extends such that operative debridement or amputation will be necessary, I be happy to evaluate her again. Perhaps rheumatology could provide better guidance regarding management of the underlying vasculitis.  Cliffton Asters Janee Morn, MD      Orthopaedic & Hand Surgery [facility] [address] [place], [place]  [address] Office: [phone] Mobile: [phone]

## 2015-03-26 NOTE — Progress Notes (Signed)
Pt arrived to unit. Report received. VSS. Pt complains of pain. Pt oriented to room. Call bell within reach. Will continue to monitor closely.  Sandrea Hammond RN

## 2015-03-26 NOTE — ED Notes (Signed)
Pt. Stated that "It would be too painful to walk". She also said that she has been  "avoiding walking unless going to the bathroom" for the past week.

## 2015-03-26 NOTE — Consult Note (Addendum)
WOC consult requested for finger wound. Attempted to contact primary team via pager to discuss. Possible finger abscess and necrotic areas are beyond [facility] scope of practice.Please consult hand surgery team for further plan of care. Please re-consult if further assistance is needed.  Thank-you,  Cammie Mcgee MSN, RN, CWOCN, [place], CNS [phone]

## 2015-03-27 ENCOUNTER — Encounter (HOSPITAL_COMMUNITY): Payer: Self-pay | Admitting: General Practice

## 2015-03-27 DIAGNOSIS — M25551 Pain in right hip: Secondary | ICD-10-CM | POA: Diagnosis not present

## 2015-03-27 DIAGNOSIS — M329 Systemic lupus erythematosus, unspecified: Secondary | ICD-10-CM

## 2015-03-27 DIAGNOSIS — L930 Discoid lupus erythematosus: Secondary | ICD-10-CM | POA: Diagnosis not present

## 2015-03-27 DIAGNOSIS — L089 Local infection of the skin and subcutaneous tissue, unspecified: Secondary | ICD-10-CM | POA: Diagnosis not present

## 2015-03-27 DIAGNOSIS — M25559 Pain in unspecified hip: Secondary | ICD-10-CM | POA: Insufficient documentation

## 2015-03-27 DIAGNOSIS — I776 Arteritis, unspecified: Secondary | ICD-10-CM

## 2015-03-27 LAB — BASIC METABOLIC PANEL
Anion gap: 9 (ref 5–15)
BUN: <5 mg/dL — ABNORMAL LOW (ref 6–20)
CALCIUM: 7.8 mg/dL — AB (ref 8.9–10.3)
CO2: 21 mmol/L — AB (ref 22–32)
CREATININE: 0.57 mg/dL (ref 0.44–1.00)
Chloride: 107 mmol/L (ref 101–111)
GFR calc non Af Amer: >60 mL/min (ref >60)
Glucose, Bld: 123 mg/dL — ABNORMAL HIGH (ref 65–99)
Potassium: 4 mmol/L (ref 3.5–5.1)
SODIUM: 137 mmol/L (ref 135–145)

## 2015-03-27 LAB — CBC
HEMATOCRIT: 37.2 % (ref 36.0–46.0)
Hemoglobin: 12 g/dL (ref 12.0–15.0)
MCH: 27.8 pg (ref 26.0–34.0)
MCHC: 32.3 g/dL (ref 30.0–36.0)
MCV: 86.3 fL (ref 78.0–100.0)
Platelets: 248 10*3/uL (ref 150–400)
RBC: 4.31 MIL/uL (ref 3.87–5.11)
RDW: 13.6 % (ref 11.5–15.5)
WBC: 5.4 10*3/uL (ref 4.0–10.5)

## 2015-03-27 MED ORDER — DOXYCYCLINE HYCLATE 100 MG PO TABS: 100.0000 mg | ORAL_TABLET | Freq: Two times a day (BID) | ORAL | Status: DC

## 2015-03-27 MED ORDER — AMLODIPINE BESYLATE 10 MG PO TABS: 10.0000 mg | ORAL_TABLET | Freq: Every day | ORAL | Status: DC

## 2015-03-27 MED ORDER — OXYCODONE-ACETAMINOPHEN 5-325 MG PO TABS: 1.0000 | ORAL_TABLET | Freq: Four times a day (QID) | ORAL | Status: DC | PRN

## 2015-03-27 NOTE — Discharge Summary (Signed)
Physician Discharge Summary  Elizabeth Baxter:[id number] DOB: January 11, 1976 DOA: 03/25/2015  PCP: Maximino Greenland, MD  Admit date: 03/25/2015 Discharge date: 03/27/2015  Time spent: 45 minutes  Recommendations for Outpatient Follow-up:  1. Rheumatologist Dr.Bock tomorrow at 2:45pm 2. Suspected Pulm fibrosis on CT, needs Pulm FU, and referral per PCP or Rheumatology  Discharge Diagnoses:    Pain in R index finger   suspected Vasculitis and ischemic changes in Index finger   Chronic pain   GERD (gastroesophageal reflux disease)   Hypokalemia   Lupus erythematosus   Myalgia   Right hip pain   Tachycardia   Exacerbation of systemic lupus   Hip pain   Vasculitis   Discharge Condition: stable  Diet recommendation: regular  Filed Weights   03/25/15 1747 03/26/15 0255  Weight: 99.791 kg (220 lb) 99.2 kg (218 lb 11.1 oz)    History of present illness:   Chief Complaint: Left index finger pain, right hip pain and generalized pain.  HPI: Elizabeth Baxter is a 39 y.o. female with PMH of lupus, GERD, PE, chronic pain, colitis, pancreatitis, Raynaud syndrome, who presented with left index finger pain, right hip pain and generalized pain. Patient reported that in the past 3 days, she has been having worsening pain over left index finger tip, right hip and generalized pain. She stated that when she had lupus flareup in the past, it always triggered her joint pain, such as pain in shoulders and the hips. She reports that she was told by her rheumatologist that she has Raynaud's syndrome, which causes her finger pain. In the past 3 days, her left index finger tip seems to be infected, and the pain over left index finger tip has been progressively getting worse. She saw some yellow color pus drainage in the left index fingertip. She reports that she has been taking Plaquenil, CellCept and 20 mg of prednisone daily normally for controlling her lupus.  In ED, patient was found to have WBC 8.1,  temperature 99.2, tachycardia, lactate 1.66, potassium 3.4, renal function okay, chest x-ray is negative for acute abnormalities. CT angiogram of the chest negative for pulmonary embolism, but showed bilateral pulmonary fibrosis.   Hospital Course:  39/F with RA on immunomodulators(cellcept and prednisone), h/o Reynauds disease, just started seeing new Rheum at [facility], presented to ER with L index finer pain, discoloration and swelling of nail bed and intermittent sero-purulent drainage from tip of finger. Since admission, no drainage or overt s/s of infection. No fever, leukocytosis, lactic acid WNL, but CRP up, started on Doxycycline, ESR only 36. She was evaluated by Hand Surgery Dr. Grandville Silos who felt that she probably has vasculitis and didn't need I&D, I called and discussed case with her rheumatologist Dr.Bock, recommended adding Amlodipine and continuing the Prednisone at 61m daily and having her FU with her. I made her a FU with Dr.Back tomorrow at 2:45pm, advised her to continue Doxycyline since this finger is at risk of secondary infection.  Consultations:  Hand Surgery Dr.Thompson  Telephone d/w Dr.Bock, Rheumatology  Discharge Exam: Filed Vitals:   03/27/15 0514  BP: 106/70  Pulse: 71  Temp: 97.5 F (36.4 C)  Resp: 18    General: AAOx3 Cardiovascular: S1S2/RRR Respiratory: CTAB  Discharge Instructions   Discharge Instructions    Diet general    Complete by:  As directed      Increase activity slowly    Complete by:  As directed  Current Discharge Medication List    START taking these medications   Details  amLODipine (NORVASC) 10 MG tablet Take 1 tablet (10 mg total) by mouth daily. Qty: 30 tablet, Refills: 0    doxycycline (VIBRA-TABS) 100 MG tablet Take 1 tablet (100 mg total) by mouth every 12 (twelve) hours. For 10days Qty: 20 tablet, Refills: 0    oxyCODONE-acetaminophen (PERCOCET/ROXICET) 5-325 MG per tablet Take 1-2 tablets by  mouth every 6 (six) hours as needed for moderate pain. Qty: 30 tablet, Refills: 0      CONTINUE these medications which have NOT CHANGED   Details  Buprenorphine HCl-Naloxone HCl (SUBOXONE) 8-2 MG FILM Place 1 Film under the tongue 3 (three) times daily.    esomeprazole (NEXIUM) 40 MG capsule Take 40 mg by mouth daily at 12 noon.    fluticasone (FLONASE) 50 MCG/ACT nasal spray Place 1 spray into both nostrils daily as needed for allergies or rhinitis.    hydroxychloroquine (PLAQUENIL) 200 MG tablet Take 200 mg by mouth 2 (two) times daily.     !! lactose free nutrition (BOOST) LIQD Take 237 mLs by mouth daily.    mycophenolate (CELLCEPT) 500 MG tablet Take 1,500 mg by mouth 2 (two) times daily.     predniSONE (DELTASONE) 20 MG tablet Take 1 tablet (20 mg total) by mouth daily. Qty: 10 tablet, Refills: 0    !! feeding supplement (ENSURE COMPLETE) LIQD Take 237 mLs by mouth daily as needed (per pt request, likes vanilla). Qty: 15 Bottle, Refills: 0     !! - Potential duplicate medications found. Please discuss with provider.     Allergies  Allergen Reactions  . Imuran [Azathioprine] Other (See Comments)    Ears burned  . Tramadol Other (See Comments)    Burned ears   Follow-up Information    Follow up with Marlowe Sax, MD On 03/28/2015.   Specialty:  Rheumatology   Contact information:   [address] [place] [address] [phone]        The results of significant diagnostics from this hospitalization (including imaging, microbiology, ancillary and laboratory) are listed below for reference.    Significant Diagnostic Studies: Dg Chest 2 View  03/25/2015   CLINICAL DATA:  Tachycardia  EXAM: CHEST - 2 VIEW  COMPARISON:  03/14/2014  FINDINGS: Cardiac shadow is mildly enlarged but stable. Patchy fibrotic changes are noted throughout both lungs stable from previous exam. No focal infiltrate or sizable effusion is seen. No acute bony abnormality is  noted.  IMPRESSION: No change from the prior CT examination. Chronic changes in both lungs are again seen.   Electronically Signed   By: Inez Catalina M.D.   On: 03/25/2015 21:51   Ct Angio Chest Pe W/cm &/or Wo Cm  03/26/2015   CLINICAL DATA:  Acute onset of tachycardia. Lupus flare up. Initial encounter.  EXAM: CT ANGIOGRAPHY CHEST WITH CONTRAST  TECHNIQUE: Multidetector CT imaging of the chest was performed using the standard protocol during bolus administration of intravenous contrast. Multiplanar CT image reconstructions and MIPs were obtained to evaluate the vascular anatomy.  CONTRAST:  27m OMNIPAQUE IOHEXOL 350 MG/ML SOLN  COMPARISON:  Chest radiograph performed earlier today at 9:27 p.m., and CTA of the chest performed 03/14/2014  FINDINGS: There is no evidence of pulmonary embolus.  Diffuse peripheral and bibasilar pulmonary fibrosis and interstitial lung disease appear perhaps mildly worsened from 2015, particularly at the right lung base. Underlying peripheral honeycombing is relatively stable. No superimposed focal airspace consolidation is  seen. No pleural effusion or pneumothorax is identified. No masses are identified; no abnormal focal contrast enhancement is seen.  A single 1.4 cm right hilar node may be related to the patient's underlying systemic process, and is stable from the prior study. No pericardial effusion is identified. The great vessels are grossly unremarkable in appearance. The esophagus is partially filled with fluid and air, raising question for mild dysmotility. No axillary lymphadenopathy is seen. The visualized portions of the thyroid gland are unremarkable in appearance.  The visualized portions of the liver and spleen are unremarkable.  No acute osseous abnormalities are seen.  Review of the MIP images confirms the above findings.  IMPRESSION: 1. No evidence of pulmonary embolus. 2. Diffuse peripheral and bibasilar pulmonary fibrosis and interstitial lung disease appear  perhaps mildly worsened from 2015, particularly at the right lung base. Underlying stable peripheral honeycombing. No superimposed focal airspace consolidation seen. 3. Stable prominent right hilar node, measuring 1.4 cm in short axis. 4. Esophagus is partially filled with fluid and air, raising question for mild dysmotility.   Electronically Signed   By: Garald Balding M.D.   On: 03/26/2015 00:29    Microbiology: No results found for this or any previous visit (from the past 240 hour(s)).   Labs: Basic Metabolic Panel:  Recent Labs Lab 03/25/15 1804 03/26/15 0530 03/27/15 0538  NA 138 137 137  K 3.4* 4.0 4.0  CL 100* 101 107  CO2 28 26 21*  GLUCOSE 100* 140* 123*  BUN 8 8 <5*  CREATININE 0.91 0.74 0.57  CALCIUM 9.0 8.4* 7.8*   Liver Function Tests:  Recent Labs Lab 03/26/15 0530  AST 32  32  ALT 15  15  ALKPHOS 38  39  BILITOT 0.7  0.6  PROT 8.2*  8.1  ALBUMIN 3.1*  3.2*   No results for input(s): LIPASE, AMYLASE in the last 168 hours. No results for input(s): AMMONIA in the last 168 hours. CBC:  Recent Labs Lab 03/25/15 1804 03/26/15 0530 03/27/15 0538  WBC 8.1 5.3 5.4  NEUTROABS 5.9  --   --   HGB 14.1 12.3 12.0  HCT 43.6 38.3 37.2  MCV 87.0 87.0 86.3  PLT 285 258 248   Cardiac Enzymes: No results for input(s): CKTOTAL, CKMB, CKMBINDEX, TROPONINI in the last 168 hours. BNP: BNP (last 3 results) No results for input(s): BNP in the last 8760 hours.  ProBNP (last 3 results) No results for input(s): PROBNP in the last 8760 hours.  CBG: No results for input(s): GLUCAP in the last 168 hours.     SignedDomenic Polite  [facility] 03/27/2015, 9:19 AM

## 2015-03-27 NOTE — Progress Notes (Signed)
Removed monitor and saline lock. D/C teaching has been performed, allowed patient to ask questions. Patient will be escorted down by a wheelchair.

## 2015-03-31 LAB — CULTURE, BLOOD (ROUTINE X 2)
CULTURE: NO GROWTH
Culture: NO GROWTH

## 2015-04-13 ENCOUNTER — Encounter (HOSPITAL_BASED_OUTPATIENT_CLINIC_OR_DEPARTMENT_OTHER): Attending: Internal Medicine

## 2015-04-13 DIAGNOSIS — S61201A Unspecified open wound of left index finger without damage to nail, initial encounter: Secondary | ICD-10-CM | POA: Insufficient documentation

## 2015-04-13 DIAGNOSIS — M1990 Unspecified osteoarthritis, unspecified site: Secondary | ICD-10-CM | POA: Diagnosis not present

## 2015-04-13 DIAGNOSIS — I7301 Raynaud's syndrome with gangrene: Secondary | ICD-10-CM | POA: Diagnosis not present

## 2015-04-13 DIAGNOSIS — S61200A Unspecified open wound of right index finger without damage to nail, initial encounter: Secondary | ICD-10-CM | POA: Insufficient documentation

## 2015-04-13 DIAGNOSIS — M329 Systemic lupus erythematosus, unspecified: Secondary | ICD-10-CM | POA: Diagnosis not present

## 2015-04-13 DIAGNOSIS — X58XXXA Exposure to other specified factors, initial encounter: Secondary | ICD-10-CM | POA: Insufficient documentation

## 2015-05-02 ENCOUNTER — Emergency Department (HOSPITAL_COMMUNITY)
Admission: EM | Admit: 2015-05-02 | Discharge: 2015-05-02 | Disposition: A | Discharge status: Home / Self Care | Attending: Emergency Medicine | Admitting: Emergency Medicine

## 2015-05-02 ENCOUNTER — Emergency Department (HOSPITAL_COMMUNITY)
Admission: EM | Admit: 2015-05-02 | Discharge: 2015-05-02 | Discharge status: Against Medical Advice | Payer: Self-pay | Attending: Emergency Medicine | Admitting: Emergency Medicine

## 2015-05-02 ENCOUNTER — Encounter (HOSPITAL_COMMUNITY): Payer: Self-pay | Admitting: Emergency Medicine

## 2015-05-02 DIAGNOSIS — R112 Nausea with vomiting, unspecified: Secondary | ICD-10-CM | POA: Diagnosis not present

## 2015-05-02 DIAGNOSIS — K219 Gastro-esophageal reflux disease without esophagitis: Secondary | ICD-10-CM | POA: Insufficient documentation

## 2015-05-02 DIAGNOSIS — M791 Myalgia, unspecified site: Secondary | ICD-10-CM

## 2015-05-02 DIAGNOSIS — Z86711 Personal history of pulmonary embolism: Secondary | ICD-10-CM | POA: Diagnosis not present

## 2015-05-02 DIAGNOSIS — R1084 Generalized abdominal pain: Secondary | ICD-10-CM | POA: Diagnosis not present

## 2015-05-02 DIAGNOSIS — R197 Diarrhea, unspecified: Secondary | ICD-10-CM | POA: Insufficient documentation

## 2015-05-02 DIAGNOSIS — G8929 Other chronic pain: Secondary | ICD-10-CM | POA: Insufficient documentation

## 2015-05-02 DIAGNOSIS — Z79899 Other long term (current) drug therapy: Secondary | ICD-10-CM | POA: Insufficient documentation

## 2015-05-02 DIAGNOSIS — R109 Unspecified abdominal pain: Secondary | ICD-10-CM | POA: Insufficient documentation

## 2015-05-02 LAB — COMPREHENSIVE METABOLIC PANEL
ALBUMIN: 4.2 g/dL (ref 3.5–5.0)
ALK PHOS: 32 U/L — AB (ref 38–126)
ALT: 11 U/L — AB (ref 14–54)
AST: 22 U/L (ref 15–41)
Anion gap: 8 (ref 5–15)
BUN: 15 mg/dL (ref 6–20)
CALCIUM: 9.2 mg/dL (ref 8.9–10.3)
CO2: 26 mmol/L (ref 22–32)
CREATININE: 0.67 mg/dL (ref 0.44–1.00)
Chloride: 103 mmol/L (ref 101–111)
GFR calc Af Amer: >60 mL/min (ref >60)
GFR calc non Af Amer: >60 mL/min (ref >60)
GLUCOSE: 102 mg/dL — AB (ref 65–99)
Potassium: 3.1 mmol/L — ABNORMAL LOW (ref 3.5–5.1)
SODIUM: 137 mmol/L (ref 135–145)
Total Bilirubin: 0.6 mg/dL (ref 0.3–1.2)
Total Protein: 9 g/dL — ABNORMAL HIGH (ref 6.5–8.1)

## 2015-05-02 LAB — CBC
HCT: 42.6 % (ref 36.0–46.0)
Hemoglobin: 13.6 g/dL (ref 12.0–15.0)
MCH: 27.8 pg (ref 26.0–34.0)
MCHC: 31.9 g/dL (ref 30.0–36.0)
MCV: 87.1 fL (ref 78.0–100.0)
PLATELETS: 415 10*3/uL — AB (ref 150–400)
RBC: 4.89 MIL/uL (ref 3.87–5.11)
RDW: 13.4 % (ref 11.5–15.5)
WBC: 9.7 10*3/uL (ref 4.0–10.5)

## 2015-05-02 LAB — URINALYSIS, ROUTINE W REFLEX MICROSCOPIC
Bilirubin Urine: NEGATIVE
GLUCOSE, UA: NEGATIVE mg/dL
HGB URINE DIPSTICK: NEGATIVE
KETONES UR: NEGATIVE mg/dL
Leukocytes, UA: NEGATIVE
Nitrite: NEGATIVE
PROTEIN: 30 mg/dL — AB
Specific Gravity, Urine: 1.036 — ABNORMAL HIGH (ref 1.005–1.030)
Urobilinogen, UA: 1 mg/dL (ref 0.0–1.0)
pH: 6 (ref 5.0–8.0)

## 2015-05-02 LAB — URINE MICROSCOPIC-ADD ON

## 2015-05-02 LAB — LIPASE, BLOOD: Lipase: 142 U/L — ABNORMAL HIGH (ref 22–51)

## 2015-05-02 MED ORDER — PROMETHAZINE HCL 25 MG/ML IJ SOLN
25.0000 mg | Freq: Once | INTRAMUSCULAR | Status: AC
  Administered 2015-05-02: 25 mg via INTRAVENOUS
  Filled 2015-05-02: qty 1

## 2015-05-02 MED ORDER — ONDANSETRON 4 MG PO TBDP: ORAL_TABLET | ORAL | Status: DC

## 2015-05-02 MED ORDER — HYDROMORPHONE HCL 1 MG/ML IJ SOLN
1.0000 mg | Freq: Once | INTRAMUSCULAR | Status: AC
  Administered 2015-05-02: 1 mg via INTRAVENOUS
  Filled 2015-05-02: qty 1

## 2015-05-02 MED ORDER — SODIUM CHLORIDE 0.9 % IV BOLUS (SEPSIS)
1000.0000 mL | Freq: Once | INTRAVENOUS | Status: AC
  Administered 2015-05-02: 1000 mL via INTRAVENOUS

## 2015-05-02 MED ORDER — METHYLPREDNISOLONE SODIUM SUCC 125 MG IJ SOLR
125.0000 mg | Freq: Once | INTRAMUSCULAR | Status: AC
  Administered 2015-05-02: 125 mg via INTRAVENOUS
  Filled 2015-05-02: qty 2

## 2015-05-02 MED ORDER — MORPHINE SULFATE (PF) 4 MG/ML IV SOLN
4.0000 mg | Freq: Once | INTRAVENOUS | Status: AC
  Administered 2015-05-02: 4 mg via INTRAVENOUS
  Filled 2015-05-02: qty 1

## 2015-05-02 MED ORDER — ONDANSETRON HCL 4 MG/2ML IJ SOLN
4.0000 mg | Freq: Once | INTRAMUSCULAR | Status: AC | PRN
  Administered 2015-05-02: 4 mg via INTRAVENOUS
  Filled 2015-05-02: qty 2

## 2015-05-02 NOTE — Discharge Instructions (Signed)
You were evaluated in the ED today and there is not appear to be an emergent cause for her symptoms at this time. It is important to stay well-hydrated at home. Please take your antinausea medicine as needed. Follow-up with your doctor tomorrow for reevaluation. Return to ED for worsening symptoms.  Nausea and Vomiting Nausea is a sick feeling that often comes before throwing up (vomiting). Vomiting is a reflex where stomach contents come out of your mouth. Vomiting can cause severe loss of body fluids (dehydration). Children and elderly adults can become dehydrated quickly, especially if they also have diarrhea. Nausea and vomiting are symptoms of a condition or disease. It is important to find the cause of your symptoms. CAUSES   Direct irritation of the stomach lining. This irritation can result from increased acid production (gastroesophageal reflux disease), infection, food poisoning, taking certain medicines (such as nonsteroidal anti-inflammatory drugs), alcohol use, or tobacco use.  Signals from the brain.These signals could be caused by a headache, heat exposure, an inner ear disturbance, increased pressure in the brain from injury, infection, a tumor, or a concussion, pain, emotional stimulus, or metabolic problems.  An obstruction in the gastrointestinal tract (bowel obstruction).  Illnesses such as diabetes, hepatitis, gallbladder problems, appendicitis, kidney problems, cancer, sepsis, atypical symptoms of a heart attack, or eating disorders.  Medical treatments such as chemotherapy and radiation.  Receiving medicine that makes you sleep (general anesthetic) during surgery. DIAGNOSIS Your caregiver may ask for tests to be done if the problems do not improve after a few days. Tests may also be done if symptoms are severe or if the reason for the nausea and vomiting is not clear. Tests may include:  Urine tests.  Blood tests.  Stool tests.  Cultures (to look for evidence of  infection).  X-rays or other imaging studies. Test results can help your caregiver make decisions about treatment or the need for additional tests. TREATMENT You need to stay well hydrated. Drink frequently but in small amounts.You may wish to drink water, sports drinks, clear broth, or eat frozen ice pops or gelatin dessert to help stay hydrated.When you eat, eating slowly may help prevent nausea.There are also some antinausea medicines that may help prevent nausea. HOME CARE INSTRUCTIONS   Take all medicine as directed by your caregiver.  If you do not have an appetite, do not force yourself to eat. However, you must continue to drink fluids.  If you have an appetite, eat a normal diet unless your caregiver tells you differently.  Eat a variety of complex carbohydrates (rice, wheat, potatoes, bread), lean meats, yogurt, fruits, and vegetables.  Avoid high-fat foods because they are more difficult to digest.  Drink enough water and fluids to keep your urine clear or pale yellow.  If you are dehydrated, ask your caregiver for specific rehydration instructions. Signs of dehydration may include:  Severe thirst.  Dry lips and mouth.  Dizziness.  Dark urine.  Decreasing urine frequency and amount.  Confusion.  Rapid breathing or pulse. SEEK IMMEDIATE MEDICAL CARE IF:   You have blood or brown flecks (like coffee grounds) in your vomit.  You have black or bloody stools.  You have a severe headache or stiff neck.  You are confused.  You have severe abdominal pain.  You have chest pain or trouble breathing.  You do not urinate at least once every 8 hours.  You develop cold or clammy skin.  You continue to vomit for longer than 24 to 48 hours.  You have a fever. MAKE SURE YOU:   Understand these instructions.  Will watch your condition.  Will get help right away if you are not doing well or get worse. Document Released: 07/28/2005 Document Revised: 10/20/2011  Document Reviewed: 12/25/2010 [facility] Patient Information 2015 [place], [place]. This information is not intended to replace advice given to you by your health care provider. Make sure you discuss any questions you have with your health care provider.  Diarrhea Diarrhea is frequent loose and watery bowel movements. It can cause you to feel weak and dehydrated. Dehydration can cause you to become tired and thirsty, have a dry mouth, and have decreased urination that often is dark yellow. Diarrhea is a sign of another problem, most often an infection that will not last long. In most cases, diarrhea typically lasts 2-3 days. However, it can last longer if it is a sign of something more serious. It is important to treat your diarrhea as directed by your caregiver to lessen or prevent future episodes of diarrhea. CAUSES  Some common causes include:  Gastrointestinal infections caused by viruses, bacteria, or parasites.  Food poisoning or food allergies.  Certain medicines, such as antibiotics, chemotherapy, and laxatives.  Artificial sweeteners and fructose.  Digestive disorders. HOME CARE INSTRUCTIONS  Ensure adequate fluid intake (hydration): Have 1 cup (8 oz) of fluid for each diarrhea episode. Avoid fluids that contain simple sugars or sports drinks, fruit juices, whole milk products, and sodas. Your urine should be clear or pale yellow if you are drinking enough fluids. Hydrate with an oral rehydration solution that you can purchase at pharmacies, retail stores, and online. You can prepare an oral rehydration solution at home by mixing the following ingredients together:   - tsp table salt.   tsp baking soda.   tsp salt substitute containing potassium chloride.  1  tablespoons sugar.  1 L (34 oz) of water.  Certain foods and beverages may increase the speed at which food moves through the gastrointestinal (GI) tract. These foods and beverages should be avoided and  include:  Caffeinated and alcoholic beverages.  High-fiber foods, such as raw fruits and vegetables, nuts, seeds, and whole grain breads and cereals.  Foods and beverages sweetened with sugar alcohols, such as xylitol, sorbitol, and mannitol.  Some foods may be well tolerated and may help thicken stool including:  Starchy foods, such as rice, toast, pasta, low-sugar cereal, oatmeal, grits, baked potatoes, crackers, and bagels.  Bananas.  Applesauce.  Add probiotic-rich foods to help increase healthy bacteria in the GI tract, such as yogurt and fermented milk products.  Wash your hands well after each diarrhea episode.  Only take over-the-counter or prescription medicines as directed by your caregiver.  Take a warm bath to relieve any burning or pain from frequent diarrhea episodes. SEEK IMMEDIATE MEDICAL CARE IF:   You are unable to keep fluids down.  You have persistent vomiting.  You have blood in your stool, or your stools are black and tarry.  You do not urinate in 6-8 hours, or there is only a small amount of very dark urine.  You have abdominal pain that increases or localizes.  You have weakness, dizziness, confusion, or light-headedness.  You have a severe headache.  Your diarrhea gets worse or does not get better.  You have a fever or persistent symptoms for more than 2-3 days.  You have a fever and your symptoms suddenly get worse. MAKE SURE YOU:   Understand these instructions.  Will watch  your condition.  Will get help right away if you are not doing well or get worse. Document Released: 07/18/2002 Document Revised: 12/12/2013 Document Reviewed: 04/04/2012 [facility] Patient Information 2015 [place], [place]. This information is not intended to replace advice given to you by your health care provider. Make sure you discuss any questions you have with your health care provider.

## 2015-05-02 NOTE — ED Provider Notes (Signed)
CSN: [id number]     Arrival date & time 05/02/15  1628 History   First MD Initiated Contact with Patient 05/02/15 1636     Chief Complaint  Patient presents with  . Abdominal Pain  . Emesis     (Consider location/radiation/quality/duration/timing/severity/associated sxs/prior Treatment) HPI Elizabeth Baxter is a 39 y.o. female with history of lupus, PE, comes in for evaluation for abdominal pain with nausea and vomiting. Patient states for the past 2 days she has had nausea and vomiting, nonbilious and nonbloody with associated diffuse abdominal pain. She denies any other pelvic pain, vaginal bleeding or discharge, urinary symptoms. Reports a fever yesterday of 101, that resolved spontaneously without medication. She reports feeling this way one year ago but cannot member she was diagnosed with a specific illness. Reports she has been unable to take her lupus medications for the past 2 days due to her vomiting. Nothing seems to make her problem better or worse. No other aggravating or modifying factors.  Past Medical History  Diagnosis Date  . Lupus   . Pulmonary embolism   . GERD (gastroesophageal reflux disease)   . Chronic pain    Past Surgical History  Procedure Laterality Date  . Pericardial window      in 2007   Family History  Problem Relation Age of Onset  . Diabetes Mother    Social History  Substance Use Topics  . Smoking status: Never Smoker   . Smokeless tobacco: Never Used  . Alcohol Use: No   OB History    No data available     Review of Systems A 10 point review of systems was completed and was negative except for pertinent positives and negatives as mentioned in the history of present illness     Allergies  Imuran and Tramadol  Home Medications   Prior to Admission medications   Medication Sig Start Date End Date Taking? Authorizing Provider  amLODipine (NORVASC) 10 MG tablet Take 1 tablet (10 mg total) by mouth daily. 03/27/15  Yes Zannie Cove, MD   esomeprazole (NEXIUM) 40 MG capsule Take 40 mg by mouth daily at 12 noon.   Yes Historical Provider, MD  gabapentin (NEURONTIN) 600 MG tablet Take 300-1,800 mg by mouth 4 (four) times daily - after meals and at bedtime. Take 1/2 tablet at breakfast and lunch, 2 at supper, and 3 at bedtime. 04/30/15  Yes Historical Provider, MD  hydroxychloroquine (PLAQUENIL) 200 MG tablet Take 200 mg by mouth 2 (two) times daily.    Yes Historical Provider, MD  lactose free nutrition (BOOST) LIQD Take 237 mLs by mouth daily.   Yes Historical Provider, MD  mycophenolate (CELLCEPT) 500 MG tablet Take 1,500 mg by mouth 2 (two) times daily.    Yes Historical Provider, MD  oxyCODONE-acetaminophen (PERCOCET/ROXICET) 5-325 MG per tablet Take 1-2 tablets by mouth every 6 (six) hours as needed for moderate pain. 03/27/15  Yes Zannie Cove, MD  predniSONE (DELTASONE) 20 MG tablet Take 1 tablet (20 mg total) by mouth daily. 11/24/14  Yes Joycie Peek, PA-C  doxycycline (VIBRA-TABS) 100 MG tablet Take 1 tablet (100 mg total) by mouth every 12 (twelve) hours. For 10days Patient not taking: Reported on 05/02/2015 03/27/15   Zannie Cove, MD  feeding supplement (ENSURE COMPLETE) LIQD Take 237 mLs by mouth daily as needed (per pt request, likes vanilla). Patient not taking: Reported on 03/25/2015 07/28/12   Leroy Sea, MD  ondansetron (ZOFRAN ODT) 4 MG disintegrating tablet 4mg  ODT q4 hours  prn nausea/vomit 05/02/15   Joycie Peek, PA-C   BP 103/67 mmHg  Pulse 82  Temp(Src) 98.3 F (36.8 C) (Oral)  Resp 18  SpO2 100%  LMP 04/22/2015 Physical Exam  Constitutional: She is oriented to person, place, and time. She appears well-developed and well-nourished.  HENT:  Head: Normocephalic and atraumatic.  Mouth/Throat: Oropharynx is clear and moist.  Eyes: Conjunctivae are normal. Pupils are equal, round, and reactive to light. Right eye exhibits no discharge. Left eye exhibits no discharge. No scleral icterus.  Neck:  Neck supple.  Cardiovascular: Normal rate, regular rhythm and normal heart sounds.   Pulmonary/Chest: Effort normal and breath sounds normal. No respiratory distress. She has no wheezes. She has no rales.  Abdominal: Soft.  Diffuse tenderness in suprapubic region. No other focal tenderness. Abdomen is otherwise soft and nondistended with no lesions or deformities. No peritoneal signs.  Musculoskeletal: She exhibits no tenderness.  Neurological: She is alert and oriented to person, place, and time.  Cranial Nerves II-XII grossly intact  Skin: Skin is warm and dry. No rash noted.  Psychiatric: She has a normal mood and affect.  Nursing note and vitals reviewed.   ED Course  Procedures (including critical care time) Labs Review Labs Reviewed  LIPASE, BLOOD - Abnormal; Notable for the following:    Lipase 142 (*)    All other components within normal limits  COMPREHENSIVE METABOLIC PANEL - Abnormal; Notable for the following:    Potassium 3.1 (*)    Glucose, Bld 102 (*)    Total Protein 9.0 (*)    ALT 11 (*)    Alkaline Phosphatase 32 (*)    All other components within normal limits  CBC - Abnormal; Notable for the following:    Platelets 415 (*)    All other components within normal limits  URINALYSIS, ROUTINE W REFLEX MICROSCOPIC (NOT AT [facility]) - Abnormal; Notable for the following:    APPearance TURBID (*)    Specific Gravity, Urine 1.036 (*)    Protein, ur 30 (*)    All other components within normal limits  URINE MICROSCOPIC-ADD ON - Abnormal; Notable for the following:    Bacteria, UA FEW (*)    All other components within normal limits    Imaging Review No results found. I have personally reviewed and evaluated these images and lab results as part of my medical decision-making.   EKG Interpretation None     Meds given in ED:  Medications  ondansetron (ZOFRAN) injection 4 mg (4 mg Intravenous Given 05/02/15 2008)  sodium chloride 0.9 % bolus 1,000 mL (1,000 mLs  Intravenous New Bag/Given 05/02/15 1906)  methylPREDNISolone sodium succinate (SOLU-MEDROL) 125 mg/2 mL injection 125 mg (125 mg Intravenous Given 05/02/15 1906)  morphine 4 MG/ML injection 4 mg (4 mg Intravenous Given 05/02/15 2006)  HYDROmorphone (DILAUDID) injection 1 mg (1 mg Intravenous Given 05/02/15 2110)  promethazine (PHENERGAN) injection 25 mg (25 mg Intravenous Given 05/02/15 2111)    Discharge Medication List as of 05/02/2015 10:22 PM    START taking these medications   Details  ondansetron (ZOFRAN ODT) 4 MG disintegrating tablet 4mg  ODT q4 hours prn nausea/vomit, Print       Filed Vitals:   05/02/15 1630 05/02/15 1950 05/02/15 2237  BP: 125/85 100/68 103/67  Pulse: 84 64 82  Temp: 97.7 F (36.5 C) 98.6 F (37 C) 98.3 F (36.8 C)  TempSrc: Oral Oral Oral  Resp: 16 18 18   SpO2: 99% 100% 100%  MDM  Elizabeth Baxter is a 39 y.o. female with a history of lupus and chronic pain comes in for evaluation of diffuse myalgias as well as nausea, vomiting and diarrhea. Has not taken her lupus medications in 2 days due to nausea and vomiting.  Vitals stable - WNL -afebrile Pt resting comfortably in ED. PE--physical exam is grossly unremarkable. Labwork labs appear to be baseline for patient. Evidence of possible dehydration on urinalysis with specific gravity of 1.036  Patient given 125 mg IV Solu-Medrol, 1 L normal saline. Patient has had one episode of emesis in the ED before antiemetics. Patient has received pain medicine and antiemetics and has been tolerating oral fluids. Overall, patient appears well, symptoms likely secondary to viral etiology and possible lupus flare due to lack of medication. Will DC with Zofran and instructions for rehydration strategies at home.  I discussed all relevant lab findings and imaging results with pt and they verbalized understanding. Discussed f/u with PCP within 48 hrs and return precautions, pt very amenable to plan.  Final diagnoses:   Myalgia  Nausea vomiting and diarrhea        Joycie Peek, PA-C 05/03/15 [id number]  Elwin Mocha, MD 05/03/15 [phone]

## 2015-05-02 NOTE — ED Notes (Signed)
Pt coming from home w/ hx of lupus. Started having N/V and abdominal pain since yesterday. Recent hx of finger surgery. Alert and oriented.

## 2015-05-02 NOTE — ED Notes (Signed)
Pt coming from home w/ a hx of lupus. N/V and abdominal pain x 2 days. Alert and oriented. Recent hx of L finger surgery.

## 2015-05-02 NOTE — ED Notes (Signed)
Pt actively vomiting in room.  

## 2016-08-03 ENCOUNTER — Emergency Department (HOSPITAL_COMMUNITY)

## 2016-08-03 ENCOUNTER — Emergency Department (HOSPITAL_COMMUNITY)
Admission: EM | Admit: 2016-08-03 | Discharge: 2016-08-03 | Disposition: A | Discharge status: Home / Self Care | Attending: Emergency Medicine | Admitting: Emergency Medicine

## 2016-08-03 ENCOUNTER — Encounter (HOSPITAL_COMMUNITY): Payer: Self-pay | Admitting: *Deleted

## 2016-08-03 DIAGNOSIS — R197 Diarrhea, unspecified: Secondary | ICD-10-CM | POA: Diagnosis not present

## 2016-08-03 DIAGNOSIS — R112 Nausea with vomiting, unspecified: Secondary | ICD-10-CM | POA: Insufficient documentation

## 2016-08-03 DIAGNOSIS — R1084 Generalized abdominal pain: Secondary | ICD-10-CM | POA: Insufficient documentation

## 2016-08-03 LAB — COMPREHENSIVE METABOLIC PANEL
ALBUMIN: 3.7 g/dL (ref 3.5–5.0)
ALT: 14 U/L (ref 14–54)
AST: 18 U/L (ref 15–41)
Alkaline Phosphatase: 52 U/L (ref 38–126)
Anion gap: 8 (ref 5–15)
BILIRUBIN TOTAL: 0.8 mg/dL (ref 0.3–1.2)
BUN: 8 mg/dL (ref 6–20)
CHLORIDE: 104 mmol/L (ref 101–111)
CO2: 27 mmol/L (ref 22–32)
Calcium: 9.2 mg/dL (ref 8.9–10.3)
Creatinine, Ser: 0.8 mg/dL (ref 0.44–1.00)
GFR calc Af Amer: >60 mL/min (ref >60)
GFR calc non Af Amer: >60 mL/min (ref >60)
GLUCOSE: 85 mg/dL (ref 65–99)
POTASSIUM: 3.5 mmol/L (ref 3.5–5.1)
Sodium: 139 mmol/L (ref 135–145)
TOTAL PROTEIN: 7.9 g/dL (ref 6.5–8.1)

## 2016-08-03 LAB — CBC
HEMATOCRIT: 40.2 % (ref 36.0–46.0)
Hemoglobin: 13.1 g/dL (ref 12.0–15.0)
MCH: 27.7 pg (ref 26.0–34.0)
MCHC: 32.6 g/dL (ref 30.0–36.0)
MCV: 85 fL (ref 78.0–100.0)
Platelets: 372 10*3/uL (ref 150–400)
RBC: 4.73 MIL/uL (ref 3.87–5.11)
RDW: 14.2 % (ref 11.5–15.5)
WBC: 17.1 10*3/uL — ABNORMAL HIGH (ref 4.0–10.5)

## 2016-08-03 LAB — URINALYSIS, ROUTINE W REFLEX MICROSCOPIC
Bilirubin Urine: NEGATIVE
Glucose, UA: NEGATIVE mg/dL
Ketones, ur: NEGATIVE mg/dL
Nitrite: NEGATIVE
PROTEIN: 100 mg/dL — AB
Specific Gravity, Urine: 1.024 (ref 1.005–1.030)
pH: 5 (ref 5.0–8.0)

## 2016-08-03 LAB — LIPASE, BLOOD: Lipase: 19 U/L (ref 11–51)

## 2016-08-03 LAB — POC URINE PREG, ED: PREG TEST UR: NEGATIVE

## 2016-08-03 MED ORDER — LOPERAMIDE HCL 2 MG PO CAPS
4.0000 mg | ORAL_CAPSULE | Freq: Once | ORAL | Status: AC
  Administered 2016-08-03: 4 mg via ORAL
  Filled 2016-08-03: qty 2

## 2016-08-03 MED ORDER — ONDANSETRON 4 MG PO TBDP
8.0000 mg | ORAL_TABLET | Freq: Once | ORAL | Status: AC
  Administered 2016-08-03: 8 mg via ORAL
  Filled 2016-08-03: qty 2

## 2016-08-03 MED ORDER — OXYMETAZOLINE HCL 0.05 % NA SOLN: 1.0000 | Freq: Once | NASAL | Status: DC

## 2016-08-03 MED ORDER — HYDROMORPHONE HCL 2 MG/ML IJ SOLN
1.0000 mg | Freq: Once | INTRAMUSCULAR | Status: AC
  Administered 2016-08-03: 1 mg via INTRAVENOUS
  Filled 2016-08-03: qty 1

## 2016-08-03 MED ORDER — MORPHINE SULFATE (PF) 4 MG/ML IV SOLN
4.0000 mg | Freq: Once | INTRAVENOUS | Status: AC
  Administered 2016-08-03: 4 mg via INTRAVENOUS
  Filled 2016-08-03: qty 1

## 2016-08-03 MED ORDER — HYDROCODONE-ACETAMINOPHEN 5-325 MG PO TABS: 1.0000 | ORAL_TABLET | Freq: Four times a day (QID) | ORAL | 0 refills | Status: DC | PRN

## 2016-08-03 MED ORDER — SODIUM CHLORIDE 0.9 % IV BOLUS (SEPSIS)
1000.0000 mL | Freq: Once | INTRAVENOUS | Status: AC
  Administered 2016-08-03: 1000 mL via INTRAVENOUS

## 2016-08-03 MED ORDER — ONDANSETRON 8 MG PO TBDP: 8.0000 mg | ORAL_TABLET | Freq: Three times a day (TID) | ORAL | 0 refills | Status: DC | PRN

## 2016-08-03 MED ORDER — IOPAMIDOL (ISOVUE-300) INJECTION 61%
INTRAVENOUS | Status: AC
  Administered 2016-08-03: 100 mL
  Filled 2016-08-03: qty 100

## 2016-08-03 MED ORDER — ONDANSETRON HCL 4 MG/2ML IJ SOLN
4.0000 mg | Freq: Once | INTRAMUSCULAR | Status: AC
  Administered 2016-08-03: 4 mg via INTRAVENOUS
  Filled 2016-08-03: qty 2

## 2016-08-03 NOTE — ED Notes (Signed)
Contacted CT - pt is next for scan

## 2016-08-03 NOTE — ED Triage Notes (Signed)
The pt is c/o abd pain and  nv and diarrhea after eating  Some chicken  Earlier tonight.  lmp now and she has had for one month  lmp noq

## 2016-08-03 NOTE — ED Provider Notes (Signed)
[facility]-EMERGENCY DEPT Provider Note   CSN: [id number] Arrival date & time: 08/03/16  0117     History   Chief Complaint Chief Complaint  Patient presents with  . Abdominal Pain    HPI ERIK NESSEL is a 40 y.o. female.  She had eaten some chicken and a [employer] yesterday. Later in the day, she developed generalized abdominal pain, nausea, vomiting, diarrhea. She has vomited multiple times and continues to have nausea and continues to have a sense that she will have more diarrhea. She denies fever, chills, sweats. She rates her abdominal pain at 10/10. Nothing makes it better nothing makes it worse. She has had similar episodes in the past from food poisoning.   The history is provided by the patient.  Abdominal Pain      Past Medical History:  Diagnosis Date  . Chronic pain   . GERD (gastroesophageal reflux disease)   . Lupus   . Pulmonary embolism [facility])     Patient Active Problem List   Diagnosis Date Noted  . Vasculitis (HCC) 03/27/2015  . Exacerbation of systemic lupus (HCC)   . Hip pain   . Myalgia 03/26/2015  . Infection of index finger 03/26/2015  . Right hip pain 03/26/2015  . Lupus   . Pulmonary embolism (HCC)   . Tachycardia   . Orthostatic hypotension 08/02/2012  . Normocytic anemia 07/31/2012  . Hypokalemia 07/29/2012  . Lupus erythematosus 07/29/2012  . Pancreatitis, acute 07/29/2012  . Colitis, acute 07/29/2012  . Dehydration 07/25/2012  . Nausea and vomiting 07/25/2012  . Hypotension 07/25/2012  . SLE exacerbation (HCC) 07/25/2012  . Chronic pain 07/25/2012  . GERD (gastroesophageal reflux disease) 07/25/2012  . History of pulmonary embolism 07/25/2012    Past Surgical History:  Procedure Laterality Date  . PERICARDIAL WINDOW     in 2007    OB History    No data available       Home Medications    Prior to Admission medications   Medication Sig Start Date End Date Taking? Authorizing Provider  amLODipine (NORVASC) 10  MG tablet Take 1 tablet (10 mg total) by mouth daily. 03/27/15   Zannie Cove, MD  doxycycline (VIBRA-TABS) 100 MG tablet Take 1 tablet (100 mg total) by mouth every 12 (twelve) hours. For 10days Patient not taking: Reported on 05/02/2015 03/27/15   Zannie Cove, MD  esomeprazole (NEXIUM) 40 MG capsule Take 40 mg by mouth daily at 12 noon.    Historical Provider, MD  feeding supplement (ENSURE COMPLETE) LIQD Take 237 mLs by mouth daily as needed (per pt request, likes vanilla). Patient not taking: Reported on 03/25/2015 07/28/12   Leroy Sea, MD  gabapentin (NEURONTIN) 600 MG tablet Take 300-1,800 mg by mouth 4 (four) times daily - after meals and at bedtime. Take 1/2 tablet at breakfast and lunch, 2 at supper, and 3 at bedtime. 04/30/15   Historical Provider, MD  hydroxychloroquine (PLAQUENIL) 200 MG tablet Take 200 mg by mouth 2 (two) times daily.     Historical Provider, MD  lactose free nutrition (BOOST) LIQD Take 237 mLs by mouth daily.    Historical Provider, MD  mycophenolate (CELLCEPT) 500 MG tablet Take 1,500 mg by mouth 2 (two) times daily.     Historical Provider, MD  ondansetron (ZOFRAN ODT) 4 MG disintegrating tablet 4mg  ODT q4 hours prn nausea/vomit 05/02/15   05/04/15, PA-C  oxyCODONE-acetaminophen (PERCOCET/ROXICET) 5-325 MG per tablet Take 1-2 tablets by mouth every 6 (six) hours as  needed for moderate pain. 03/27/15   Zannie Cove, MD  predniSONE (DELTASONE) 20 MG tablet Take 1 tablet (20 mg total) by mouth daily. 11/24/14   Joycie Peek, PA-C    Family History Family History  Problem Relation Age of Onset  . Diabetes Mother     Social History Social History  Substance Use Topics  . Smoking status: Never Smoker  . Smokeless tobacco: Never Used  . Alcohol use No     Allergies   Imuran [azathioprine] and Tramadol   Review of Systems Review of Systems  Gastrointestinal: Positive for abdominal pain.  All other systems reviewed and are  negative.    Physical Exam Updated Vital Signs BP 108/85   Pulse 94   Temp 97.7 F (36.5 C) (Oral)   Resp 15   LMP 08/03/2016   SpO2 97%   Physical Exam  Nursing note and vitals reviewed.  40 year old female, resting comfortably and in no acute distress. Vital signs are normal. Oxygen saturation is 97%, which is normal. Head is normocephalic and atraumatic. PERRLA, EOMI. Oropharynx is clear. Neck is nontender and supple without adenopathy or JVD. Back is nontender and there is no CVA tenderness. Lungs are clear without rales, wheezes, or rhonchi. Chest is nontender. Heart has regular rate and rhythm without murmur. Abdomen is soft, flat, with mild to moderate tenderness diffusely. There is no rebound or guarding. There are no masses or hepatosplenomegaly and peristalsis is hypoactive. Extremities have no cyanosis or edema, full range of motion is present. Skin is warm and dry without rash. Neurologic: Mental status is normal, cranial nerves are intact, there are no motor or sensory deficits.  ED Treatments / Results  Labs (all labs ordered are listed, but only abnormal results are displayed) Labs Reviewed  CBC - Abnormal; Notable for the following:       Result Value   WBC 17.1 (*)    All other components within normal limits  URINALYSIS, ROUTINE W REFLEX MICROSCOPIC - Abnormal; Notable for the following:    Color, Urine AMBER (*)    APPearance CLOUDY (*)    Hgb urine dipstick LARGE (*)    Protein, ur 100 (*)    Leukocytes, UA TRACE (*)    Bacteria, UA RARE (*)    Squamous Epithelial / LPF 0-5 (*)    All other components within normal limits  LIPASE, BLOOD  COMPREHENSIVE METABOLIC PANEL  POC URINE PREG, ED   Radiology No results found.  Procedures Procedures (including critical care time)  Medications Ordered in ED Medications  ondansetron (ZOFRAN-ODT) disintegrating tablet 8 mg (8 mg Oral Given 08/03/16 0155)  sodium chloride 0.9 % bolus 1,000 mL (0 mLs  Intravenous Stopped 08/03/16 0715)  ondansetron (ZOFRAN) injection 4 mg (4 mg Intravenous Given 08/03/16 0549)  loperamide (IMODIUM) capsule 4 mg (4 mg Oral Given 08/03/16 0546)  morphine 4 MG/ML injection 4 mg (4 mg Intravenous Given 08/03/16 0610)  HYDROmorphone (DILAUDID) injection 1 mg (1 mg Intravenous Given 08/03/16 0827)     Initial Impression / Assessment and Plan / ED Course  I have reviewed the triage vital signs and the nursing notes.  Pertinent labs & imaging results that were available during my care of the patient were reviewed by me and considered in my medical decision making (see chart for details).  Clinical Course    Abdominal pain, vomiting, diarrhea. Pattern is certainly consistent with viral gastroenteritis. Old records are reviewed and she did have a hospitalization for similar  illness 4 years ago. Symptoms could also be due to food poisoning. Laboratory workup shows elevated WBC but is otherwise unremarkable. She did receive a dose of ondansetron oral dissolving tablet and nausea and is slightly improved. She will be given IV hydration, morphine, additional ondansetron and reassessed. Also given a dose of oral loperamide.  Following this, patient stated she was not getting any relief. She is given morphine for pain and still had no relief. Continuing to complain of generalized abdominal pain. WBC is noted to be moderately elevated to 17.1. Arrangements are made to send her for CT of abdomen and pelvis. Case is signed out to Dr. Lynelle Doctor to evaluate that result.  Final Clinical Impressions(s) / ED Diagnoses   Final diagnoses:  Abdominal pain, generalized  Nausea vomiting and diarrhea    New Prescriptions New Prescriptions   No medications on file     Dione Booze, MD 08/03/16 2240

## 2016-08-03 NOTE — ED Provider Notes (Signed)
Ct Abdomen Pelvis W Contrast  Result Date: 08/03/2016 CLINICAL DATA:  Lower pelvic pain, diarrhea, history of lupus EXAM: CT ABDOMEN AND PELVIS WITH CONTRAST TECHNIQUE: Multidetector CT imaging of the abdomen and pelvis was performed using the standard protocol following bolus administration of intravenous contrast. CONTRAST:  ISOVUE-300 IOPAMIDOL (ISOVUE-300) INJECTION 61% COMPARISON:  07/29/2012 FINDINGS: Lower chest: Subpleural reticulation/ fibrosis at the lung bases, reflecting chronic interstitial lung disease. Hepatobiliary: Liver is within normal limits. Gallbladder is unremarkable. No intrahepatic or extrahepatic ductal dilatation. Pancreas: Within normal limits. Spleen: Within normal limits. Adrenals/Urinary Tract: Adrenal glands are within normal limits. Kidneys are within normal limits.  No hydronephrosis. Bladder is within normal limits. Stomach/Bowel: Stomach is within normal limits. No evidence of bowel obstruction. Normal appendix. No colonic wall thickening or inflammatory changes. Left colon is decompressed. Vascular/Lymphatic: No evidence of abdominal aortic aneurysm. No suspicious abdominopelvic lymphadenopathy. Reproductive: Uterus is within normal limits. Bilateral ovaries are within normal limits. Other: No abdominopelvic ascites. Musculoskeletal: Visualized osseous structures are within normal limits. IMPRESSION: Negative CT abdomen/pelvis. No CT findings to account for the patient's lower abdominal pain. Electronically Signed   By: Charline Bills M.D.   On: 08/03/2016 09:50   Pt was seen by Dr Preston Fleeting earlier.  Please see his note for details.  Labs tests without acute process on CT.  Stable for discharge.  Will dc home with medications for pain and nausea   Linwood Dibbles, MD 08/03/16 1046

## 2016-08-03 NOTE — Discharge Instructions (Signed)
Drink plenty of fluids, take the medications as needed for pain and nausea. Follow-up with your doctor if the symptoms have not resolved by next week. Return as needed for worsening symptoms

## 2017-03-18 ENCOUNTER — Encounter (HOSPITAL_COMMUNITY): Payer: Self-pay

## 2017-03-18 ENCOUNTER — Emergency Department (HOSPITAL_COMMUNITY)

## 2017-03-18 DIAGNOSIS — M546 Pain in thoracic spine: Secondary | ICD-10-CM | POA: Insufficient documentation

## 2017-03-18 DIAGNOSIS — M549 Dorsalgia, unspecified: Secondary | ICD-10-CM | POA: Diagnosis present

## 2017-03-18 DIAGNOSIS — R21 Rash and other nonspecific skin eruption: Secondary | ICD-10-CM | POA: Insufficient documentation

## 2017-03-18 DIAGNOSIS — R0602 Shortness of breath: Secondary | ICD-10-CM | POA: Insufficient documentation

## 2017-03-18 LAB — BASIC METABOLIC PANEL
Anion gap: 9 (ref 5–15)
BUN: 8 mg/dL (ref 6–20)
CALCIUM: 8.7 mg/dL — AB (ref 8.9–10.3)
CO2: 26 mmol/L (ref 22–32)
CREATININE: 0.7 mg/dL (ref 0.44–1.00)
Chloride: 100 mmol/L — ABNORMAL LOW (ref 101–111)
GFR calc Af Amer: >60 mL/min (ref >60)
GFR calc non Af Amer: >60 mL/min (ref >60)
Glucose, Bld: 149 mg/dL — ABNORMAL HIGH (ref 65–99)
Potassium: 3.5 mmol/L (ref 3.5–5.1)
Sodium: 135 mmol/L (ref 135–145)

## 2017-03-18 LAB — CBC
HCT: 32.1 % — ABNORMAL LOW (ref 36.0–46.0)
HEMOGLOBIN: 9.3 g/dL — AB (ref 12.0–15.0)
MCH: 20.7 pg — ABNORMAL LOW (ref 26.0–34.0)
MCHC: 29 g/dL — ABNORMAL LOW (ref 30.0–36.0)
MCV: 71.5 fL — ABNORMAL LOW (ref 78.0–100.0)
PLATELETS: 479 10*3/uL — AB (ref 150–400)
RBC: 4.49 MIL/uL (ref 3.87–5.11)
RDW: 18.6 % — ABNORMAL HIGH (ref 11.5–15.5)
WBC: 9.2 10*3/uL (ref 4.0–10.5)

## 2017-03-19 ENCOUNTER — Other Ambulatory Visit: Payer: Self-pay

## 2017-03-19 ENCOUNTER — Emergency Department (HOSPITAL_COMMUNITY)
Admission: EM | Admit: 2017-03-19 | Discharge: 2017-03-19 | Disposition: A | Discharge status: Home / Self Care | Attending: Emergency Medicine | Admitting: Emergency Medicine

## 2017-03-19 ENCOUNTER — Encounter (HOSPITAL_COMMUNITY): Payer: Self-pay | Admitting: *Deleted

## 2017-03-19 ENCOUNTER — Emergency Department (HOSPITAL_COMMUNITY)

## 2017-03-19 DIAGNOSIS — M546 Pain in thoracic spine: Secondary | ICD-10-CM

## 2017-03-19 DIAGNOSIS — R21 Rash and other nonspecific skin eruption: Secondary | ICD-10-CM

## 2017-03-19 LAB — I-STAT TROPONIN, ED: TROPONIN I, POC: 0.01 ng/mL (ref 0.00–0.08)

## 2017-03-19 LAB — I-STAT CG4 LACTIC ACID, ED: Lactic Acid, Venous: 1.55 mmol/L (ref 0.5–1.9)

## 2017-03-19 LAB — I-STAT BETA HCG BLOOD, ED (MC, WL, AP ONLY): I-stat hCG, quantitative: <5 m[IU]/mL (ref <5)

## 2017-03-19 LAB — BRAIN NATRIURETIC PEPTIDE: B NATRIURETIC PEPTIDE 5: 31.2 pg/mL (ref 0.0–100.0)

## 2017-03-19 MED ORDER — HYDROCODONE-ACETAMINOPHEN 5-325 MG PO TABS: 1.0000 | ORAL_TABLET | Freq: Four times a day (QID) | ORAL | 0 refills | Status: DC | PRN

## 2017-03-19 MED ORDER — IOPAMIDOL (ISOVUE-370) INJECTION 76%
100.0000 mL | Freq: Once | INTRAVENOUS | Status: AC | PRN
  Administered 2017-03-19: 100 mL via INTRAVENOUS

## 2017-03-19 MED ORDER — MORPHINE SULFATE (PF) 4 MG/ML IV SOLN
4.0000 mg | Freq: Once | INTRAVENOUS | Status: AC
  Administered 2017-03-19: 4 mg via INTRAVENOUS
  Filled 2017-03-19: qty 1

## 2017-03-19 MED ORDER — ONDANSETRON HCL 4 MG/2ML IJ SOLN
4.0000 mg | Freq: Once | INTRAMUSCULAR | Status: AC
  Administered 2017-03-19: 4 mg via INTRAVENOUS
  Filled 2017-03-19: qty 2

## 2017-03-19 MED ORDER — METHYLPREDNISOLONE SODIUM SUCC 125 MG IJ SOLR
125.0000 mg | Freq: Once | INTRAMUSCULAR | Status: AC
  Administered 2017-03-19: 125 mg via INTRAVENOUS
  Filled 2017-03-19: qty 2

## 2017-03-19 MED ORDER — PREDNISONE 20 MG PO TABS: 60.0000 mg | ORAL_TABLET | Freq: Every day | ORAL | 0 refills | Status: DC

## 2017-03-19 NOTE — ED Provider Notes (Signed)
TIME SEEN: 2:23 AM   CHIEF COMPLAINT: Back Pain  By signing my name below, I, Ny'Kea Lewis, attest that this documentation has been prepared under the direction and in the presence of Riddick Nuon, Layla Maw, DO. Electronically Signed: Karren Cobble, ED Scribe. 03/19/17. 2:29 AM.  HPI:  Elizabeth Baxter is a 41 y.o. female with a PMHx of lupus on CellCept and prednisone, who presents to the Emergency Department complaining of sudden onset, gradually worsening left-sided back pain that began three days ago. She notes associated cough with no production. She endorses a history of pericardial effusion back in 2007, and she had to have a pericardiocentesis. No h/o PE/DVT. She denies fever or chills. No leg swelling or pain. No chest discomfort. She does have a rash diffusely across her chest and arms which is from her lupus.  ROS: See HPI Constitutional: no fever  Eyes: no drainage  ENT: no runny nose   Cardiovascular:  no chest pain  Resp: no SOB, + cough  GI: no vomiting GU: no dysuria Integumentary:  rash  Allergy: no hives  Musculoskeletal: no leg swelling  Neurological: no slurred speech ROS otherwise negative  PAST MEDICAL HISTORY/PAST SURGICAL HISTORY:  Past Medical History:  Diagnosis Date  . Lupus     MEDICATIONS:  Prior to Admission medications   Not on File    ALLERGIES:  Allergies  Allergen Reactions  . Tramadol     SOCIAL HISTORY:  Social History  Substance Use Topics  . Smoking status: Never Smoker  . Smokeless tobacco: Never Used  . Alcohol use No    FAMILY HISTORY: No family history on file.  EXAM: BP 103/73   Pulse 90   Temp 99.1 F (37.3 C) (Oral)   Resp 16   Ht 5\' 6"  (1.676 m)   Wt 240 lb (108.9 kg)   LMP 02/25/2017 (Approximate)   SpO2 97%   BMI 38.74 kg/m  CONSTITUTIONAL: Alert and oriented and responds appropriately to questions. Well-appearing; well-nourished, Appears uncomfortable, afebrile and nontoxic HEAD: Normocephalic EYES: Conjunctivae  clear, PERRL ENT: normal nose; no rhinorrhea; moist mucous membranes; pharynx without lesions noted NECK: Supple, no meningismus, no LAD  CARD: RRR; S1 and S2 appreciated; no murmurs, no clicks, no rubs, no gallops RESP: Normal chest excursion without splinting or tachypnea; breath sounds clear and equal bilaterally; no wheezes, no rhonchi, no rales, no hypoxia or respiratory distress, speaking full sentences ABD/GI: Normal bowel sounds; non-distended; soft, non-tender, no rebound, no guarding BACK:  The back appears normal and is tender over the left posterior thoracic area without erythema, warmth, swelling or ecchymosis. There is no midline step-off or deformity, there is no CVA tenderness EXT: Normal ROM in all joints; non-tender to palpation; no edema; normal capillary refill; no cyanosis    SKIN: Normal color for age and race; warm, patient has a erythematous macular diffuse rash to her chest and arms that she states is consistent with her previous lupus flares NEURO: Moves all extremities equally PSYCH: The patient's mood and manner are appropriate. Grooming and personal hygiene are appropriate.  MEDICAL DECISION MAKING: Patient here with left posterior shoulder pain. States this feels similar to when she had a pericardial effusion and needed a pericardiocentesis. She has no sign of temp and on physiology at this time. Labs unremarkable other than chronic anemia. Chest x-ray shows moderate enlargement of the cardiac silhouette which may represent cardiomegaly versus pericardial effusion. She is also risk for pulmonary embolus given her lupus. Will proceed with CT  of her chest for further evaluation. She has reticular markings seen on chest x-ray that could represent atypical pneumonia versus interstitial edema. She has no known history of CHF. She does report cough and does have a low-grade temperature here of 99.1. Will add on lactate, BNP. We'll give morphine for pain control.  ED PROGRESS:  Patient's lactate is normal. Troponin is negative. BNP is 31. Pregnancy test negative. CT scan pending for further evaluation.  4:30 AM  Pt's CT scan shows no acute pulmonary embolus. She has a enlarged main pulmonary artery that is seen with her chronic pulmonary arterial hypertension and is unchanged. She has stable mild cardiomegaly with no pericardial effusion. She has chronic interstitial lung disease but no edema. There is no sign of pneumonia. I suspect that this is musculoskeletal pain, thoracic strain given pain is worse with movement and reproducible with palpation. Pain is improved with morphine. We'll discharge her short course of Vicodin for pain control. As for her rash, patient states that normally she gets "a shot of steroids" and then a prednisone taper. She is on low-dose prednisone chronically. She will follow-up with her rheumatologist. Will give dose of Solu-Medrol here and discharge with prednisone taper. I have discussed return precautions with patient. I do not think this is ACS especially given constant symptoms for 3 days with a negative troponin and EKG with no ischemic change. I do not think this is a dissection. I feel she is safe to be discharged home and she is comfortable with this plan. At this time I do not feel she needs any antibiotics. She does not appear to be septic or have any sign of acute infection today despite her immunocompromised status.   At this time, I do not feel there is any life-threatening condition present. I have reviewed and discussed all results (EKG, imaging, lab, urine as appropriate) and exam findings with patient/family. I have reviewed nursing notes and appropriate previous records.  I feel the patient is safe to be discharged home without further emergent workup and can continue workup as an outpatient as needed. Discussed usual and customary return precautions. Patient/family verbalize understanding and are comfortable with this plan.  Outpatient  follow-up has been provided if needed. All questions have been answered.     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  PERFORMED TD:SKAJGO IMAGES ARCHIVED?: Yes LIMITATIONS:  Body habitus VIEWS USED: Parasternal long axis and short axis INTERPRETATION: Pericardial effusioin absent      Wonda Goodgame, Layla Maw, DO 03/19/17 [phone]

## 2017-03-19 NOTE — Discharge Instructions (Signed)
Please hold your home dose of prednisone until you have completed your prednisone pack.  When you have finished the steroid taper you may restart your normal dose of prednisone daily.  Please follow-up with your rheumatologist.

## 2017-03-24 LAB — CULTURE, BLOOD (ROUTINE X 2)
CULTURE: NO GROWTH
CULTURE: NO GROWTH
SPECIAL REQUESTS: ADEQUATE
SPECIAL REQUESTS: ADEQUATE

## 2017-10-17 ENCOUNTER — Encounter (HOSPITAL_COMMUNITY): Payer: Self-pay | Admitting: *Deleted

## 2017-10-17 ENCOUNTER — Other Ambulatory Visit: Payer: Self-pay

## 2017-10-17 ENCOUNTER — Ambulatory Visit (HOSPITAL_COMMUNITY)
Admission: EM | Admit: 2017-10-17 | Discharge: 2017-10-17 | Disposition: A | Discharge status: Home / Self Care | Attending: Family Medicine | Admitting: Family Medicine

## 2017-10-17 DIAGNOSIS — M25561 Pain in right knee: Secondary | ICD-10-CM

## 2017-10-17 MED ORDER — KETOROLAC TROMETHAMINE 60 MG/2ML IM SOLN
INTRAMUSCULAR | Status: AC
  Filled 2017-10-17: qty 2

## 2017-10-17 MED ORDER — HYDROCODONE-ACETAMINOPHEN 5-325 MG PO TABS: 2.0000 | ORAL_TABLET | Freq: Four times a day (QID) | ORAL | 0 refills | Status: DC | PRN

## 2017-10-17 MED ORDER — KETOROLAC TROMETHAMINE 60 MG/2ML IM SOLN
60.0000 mg | Freq: Once | INTRAMUSCULAR | Status: AC
  Administered 2017-10-17: 60 mg via INTRAMUSCULAR

## 2017-10-17 NOTE — ED Triage Notes (Signed)
C/O right knee pain R/T lupus x approx 1 day.

## 2017-10-17 NOTE — Discharge Instructions (Addendum)
Ice/cold pack over area for 10-15 min twice daily.  Ibuprofen 400-600 mg (2-3 over the counter strength tabs) every 6 hours as needed for pain.  Do not drink alcohol, do any illicit/street drugs, drive or do anything that requires alertness while on this medicine.

## 2017-10-17 NOTE — ED Provider Notes (Signed)
  [facility] CARE CENTER    CSN: [id number] Arrival date & time: 10/17/17  1442   Musculoskeletal Exam  Patient: Elizabeth Baxter DOB: 1976/07/09  DOS: 10/17/2017  SUBJECTIVE:  Chief Complaint:   Chief Complaint  Patient presents with  . Knee Pain    Elizabeth Baxter is a 42 y.o.  female for evaluation and treatment of R knee pain.   Onset:  1 day ago. No inj or change in activity Location: L med knee Character:  aching and sharp  Progression of issue:  is unchanged Associated symptoms: Swelling Has hx of SLE, says this sometimes happens Treatment: to date has been OTC NSAIDS.   Neurovascular symptoms: no  ROS: Musculoskeletal/Extremities: +R knee pain  Past Medical History:  Diagnosis Date  . Chronic pain   . GERD (gastroesophageal reflux disease)   . Lupus   . Pulmonary embolism (HCC)     Objective: VITAL SIGNS: BP 126/69   Pulse 91   Temp (!) 97.3 F (36.3 C) (Oral)   Resp 16   LMP 10/05/2017 (Approximate)   SpO2 97%  Constitutional: Well formed, well developed. No acute distress. Cardiovascular: Brisk cap refill Thorax & Lungs: No accessory muscle use Musculoskeletal: R knee.   Normal active range of motion: yes.   Normal passive range of motion: yes Tenderness to palpation: yes over anteromedial knee Mild effusion noted, no erythema or excessive warmth over knee Deformity: no Ecchymosis: no Tests positive: none Tests negative: Lachman's, varus/valgus, McMurray's, patellar grind/apprehension Neurologic: Normal sensory function. No focal deficits noted.  Psychiatric: Normal mood. Age appropriate judgment and insight. Alert & oriented x 3.    Assessment:  Acute pain of right knee  Plan: Norco, NSAIDs, ice, activity as tolerated. Toradol injection here. Do not drink alcohol, do any illicit/street drugs, drive or do anything that requires alertness while on this medicine.  F/u with pcp prn. The patient voiced understanding and agreement to the  plan.    Sharlene Dory, [place] 10/17/17 2152

## 2018-03-26 ENCOUNTER — Emergency Department (HOSPITAL_COMMUNITY)
Admission: EM | Admit: 2018-03-26 | Discharge: 2018-03-26 | Disposition: A | Discharge status: Home / Self Care | Attending: Emergency Medicine | Admitting: Emergency Medicine

## 2018-03-26 ENCOUNTER — Encounter (HOSPITAL_COMMUNITY): Payer: Self-pay

## 2018-03-26 ENCOUNTER — Other Ambulatory Visit: Payer: Self-pay

## 2018-03-26 ENCOUNTER — Emergency Department (HOSPITAL_COMMUNITY)

## 2018-03-26 DIAGNOSIS — Z79899 Other long term (current) drug therapy: Secondary | ICD-10-CM | POA: Insufficient documentation

## 2018-03-26 DIAGNOSIS — I471 Supraventricular tachycardia: Secondary | ICD-10-CM | POA: Insufficient documentation

## 2018-03-26 DIAGNOSIS — R079 Chest pain, unspecified: Secondary | ICD-10-CM | POA: Diagnosis present

## 2018-03-26 LAB — BASIC METABOLIC PANEL
ANION GAP: 9 (ref 5–15)
BUN: <5 mg/dL — ABNORMAL LOW (ref 6–20)
CHLORIDE: 108 mmol/L (ref 98–111)
CO2: 24 mmol/L (ref 22–32)
Calcium: 8.8 mg/dL — ABNORMAL LOW (ref 8.9–10.3)
Creatinine, Ser: 0.86 mg/dL (ref 0.44–1.00)
GFR calc non Af Amer: >60 mL/min (ref >60)
Glucose, Bld: 77 mg/dL (ref 70–99)
POTASSIUM: 3.5 mmol/L (ref 3.5–5.1)
SODIUM: 141 mmol/L (ref 135–145)

## 2018-03-26 LAB — CBC
HEMATOCRIT: 39.6 % (ref 36.0–46.0)
Hemoglobin: 11.4 g/dL — ABNORMAL LOW (ref 12.0–15.0)
MCH: 22.1 pg — AB (ref 26.0–34.0)
MCHC: 28.8 g/dL — ABNORMAL LOW (ref 30.0–36.0)
MCV: 76.6 fL — AB (ref 78.0–100.0)
Platelets: 512 10*3/uL — ABNORMAL HIGH (ref 150–400)
RBC: 5.17 MIL/uL — AB (ref 3.87–5.11)
RDW: 18 % — ABNORMAL HIGH (ref 11.5–15.5)
WBC: 15.5 10*3/uL — AB (ref 4.0–10.5)

## 2018-03-26 LAB — I-STAT TROPONIN, ED: Troponin i, poc: 0.01 ng/mL (ref 0.00–0.08)

## 2018-03-26 LAB — I-STAT BETA HCG BLOOD, ED (MC, WL, AP ONLY)

## 2018-03-26 MED ORDER — SODIUM CHLORIDE 0.9 % IV BOLUS
1000.0000 mL | Freq: Once | INTRAVENOUS | Status: AC
  Administered 2018-03-26: 1000 mL via INTRAVENOUS

## 2018-03-26 MED ORDER — MORPHINE SULFATE (PF) 2 MG/ML IV SOLN
2.0000 mg | Freq: Once | INTRAVENOUS | Status: AC
  Administered 2018-03-26: 2 mg via INTRAVENOUS
  Filled 2018-03-26: qty 1

## 2018-03-26 MED ORDER — METOPROLOL SUCCINATE ER 25 MG PO TB24: 12.5000 mg | ORAL_TABLET | Freq: Every day | ORAL | 0 refills | Status: AC

## 2018-03-26 NOTE — ED Triage Notes (Signed)
Pt from home via ems; c/o CP; walking outside, came back into house, began feeling dizzy, having cp, tachycardic, diaphoretic; [id number] on ems arrival; 6 mg adenosine given PTA; 100-109 sinus; 324 asa, 1 nitro given PTA; still c/o cp, dizziness, feels like heart is racing, HA; denies n/v ; hx fluid around heart and lungs, had surgery in 2007  110/67 100-109 100 2L RR 18

## 2018-03-26 NOTE — ED Provider Notes (Signed)
[facility] EMERGENCY DEPARTMENT Provider Note   CSN: [id number] Arrival date & time: 03/26/18  1651     History   Chief Complaint Chief Complaint  Patient presents with  . Chest Pain    HPI Elizabeth Baxter is a 42 y.o. female.  HPI Presents after an episode of palpitations and pain. She states that she is feeling substantially better, after EMS personnel provided adenosine, in route. She has no history of SVT, does have a history of lupus, pulmonary embolism, states that in the past few days, as she ran out of her pain medication she has been feeling generally sore, but otherwise in her usual state of health. Today about 2 hours prior to ED arrival she felt the onset of palpitations, subsequently chest tightness. Symptoms persisted, 3 exertion, and positioning, until she was with EMS providers and received the after mentioned medication. No other recent medication change, diet change, activity change.  Past Medical History:  Diagnosis Date  . Chronic pain   . GERD (gastroesophageal reflux disease)   . Lupus (HCC)   . Pulmonary embolism [facility])     Patient Active Problem List   Diagnosis Date Noted  . Vasculitis (HCC) 03/27/2015  . Exacerbation of systemic lupus (HCC)   . Hip pain   . Myalgia 03/26/2015  . Infection of index finger 03/26/2015  . Right hip pain 03/26/2015  . Lupus (HCC)   . Pulmonary embolism (HCC)   . Tachycardia   . Orthostatic hypotension 08/02/2012  . Normocytic anemia 07/31/2012  . Hypokalemia 07/29/2012  . Lupus erythematosus 07/29/2012  . Pancreatitis, acute 07/29/2012  . Colitis, acute 07/29/2012  . Dehydration 07/25/2012  . Nausea and vomiting 07/25/2012  . Hypotension 07/25/2012  . SLE exacerbation (HCC) 07/25/2012  . Chronic pain 07/25/2012  . GERD (gastroesophageal reflux disease) 07/25/2012  . History of pulmonary embolism 07/25/2012    Past Surgical History:  Procedure Laterality Date  . PERICARDIAL WINDOW       in 2007     OB History   None      Home Medications    Prior to Admission medications   Medication Sig Start Date End Date Taking? Authorizing Provider  hydroxychloroquine (PLAQUENIL) 200 MG tablet Take 200 mg by mouth 2 (two) times daily.   Yes [provider]  lactose free nutrition (BOOST) LIQD Take 237 mLs by mouth daily.   Yes [provider]  amLODipine (NORVASC) 10 MG tablet Take 1 tablet (10 mg total) by mouth daily. Patient not taking: Reported on 03/26/2018 03/27/15   Zannie Cove, MD  HYDROcodone-acetaminophen (NORCO/VICODIN) 5-325 MG tablet Take 2 tablets by mouth every 6 (six) hours as needed (Pain). Patient not taking: Reported on 03/26/2018 10/17/17   Sharlene Dory, DO  mycophenolate (CELLCEPT) 250 MG capsule Take 750 mg by mouth 2 (two) times daily.    [provider]  mycophenolate (CELLCEPT) 500 MG tablet Take 1,500 mg by mouth 2 (two) times daily.     [provider]  predniSONE (DELTASONE) 20 MG tablet Take 20 mg by mouth daily with breakfast.    [provider]  SUBOXONE 8-2 MG FILM Place 0.5-1 Film under the tongue See admin instructions. Dissolve 1 film in the mouth once a day and 0.5 film at bedtime 01/26/18   [provider]    Family History Family History  Problem Relation Age of Onset  . Diabetes Mother     Social History Social History   Tobacco  Use  . Smoking status: Never Smoker  . Smokeless tobacco: Never Used  Substance Use Topics  . Alcohol use: No  . Drug use: No    Comment: no IV Drug use in past     Allergies   Broccoli [brassica oleracea italica]; Imuran [azathioprine]; Tramadol; Tramadol; and Azathioprine   Review of Systems Review of Systems  Constitutional:       Per HPI, otherwise negative  HENT:       Per HPI, otherwise negative  Respiratory:       Per HPI, otherwise negative  Cardiovascular:       Per HPI, otherwise negative  Gastrointestinal: Negative  for vomiting.  Endocrine:       Negative aside from HPI  Genitourinary:       Neg aside from HPI   Musculoskeletal:       Per HPI, otherwise negative  Skin: Negative.   Allergic/Immunologic: Positive for immunocompromised state.  Neurological: Negative for syncope.     Physical Exam Updated Vital Signs BP 108/72 (BP Location: Left Arm)   Pulse 95   Temp 98.1 F (36.7 C)   Resp 20   Ht 5\' 6"  (1.676 m)   Wt 93.9 kg   SpO2 100%   BMI 33.41 kg/m   Physical Exam  Constitutional: She is oriented to person, place, and time. She appears well-developed and well-nourished. No distress.  HENT:  Head: Normocephalic and atraumatic.  Eyes: Conjunctivae and EOM are normal.  Cardiovascular: Regular rhythm. Tachycardia present.  Pulmonary/Chest: Effort normal and breath sounds normal. No stridor. No respiratory distress.  Abdominal: She exhibits no distension.  Musculoskeletal: She exhibits no edema.  Neurological: She is alert and oriented to person, place, and time. No cranial nerve deficit.  Skin: Skin is warm and dry.  Psychiatric: She has a normal mood and affect.  Nursing note and vitals reviewed.    ED Treatments / Results  Labs (all labs ordered are listed, but only abnormal results are displayed) Labs Reviewed  BASIC METABOLIC PANEL - Abnormal; Notable for the following components:      Result Value   BUN <5 (*)    Calcium 8.8 (*)    All other components within normal limits  CBC - Abnormal; Notable for the following components:   WBC 15.5 (*)    RBC 5.17 (*)    Hemoglobin 11.4 (*)    MCV 76.6 (*)    MCH 22.1 (*)    MCHC 28.8 (*)    RDW 18.0 (*)    Platelets 512 (*)    All other components within normal limits  I-STAT TROPONIN, ED  I-STAT BETA HCG BLOOD, ED ([facility], [facility], [facility] ONLY)    Patient's EMS rhythm strip reviewed, discussed with patient and family members, including evidence for SVT. Notable is a pause, followed by resolution of SVT, initiation of sinus  tachycardia.    EKG EKG Interpretation  Date/Time:  Friday March 26 2018 17:11:08 EDT Ventricular Rate:  104 PR Interval:    QRS Duration: 84 QT Interval:  339 QTC Calculation: 446 R Axis:   42 Text Interpretation:  Sinus tachycardia Left atrial enlargement Anterior injury pattern T wave abnormality Abnormal ekg Confirmed by [id number]) on 03/26/2018 5:59:02 PM   Radiology Dg Chest 2 View  Result Date: 03/26/2018 CLINICAL DATA:  Chest pain and dizziness.  Tachycardia EXAM: CHEST - 2 VIEW COMPARISON:  None. FINDINGS: There is interstitial prominence in the lungs fairly diffusely, likely fibrosis. No frank  edema or consolidation. Heart is mildly enlarged with pulmonary vascularity normal. No adenopathy. No bone lesions. IMPRESSION: Apparent fibrotic change in the lungs bilaterally. No frank edema or consolidation. Mild cardiac enlargement. No adenopathy. Electronically Signed   By: Bretta Bang III M.D.   On: 03/26/2018 18:10    Procedures Procedures (including critical care time)  Medications Ordered in ED Medications  sodium chloride 0.9 % bolus 1,000 mL (1,000 mLs Intravenous New Bag/Given 03/26/18 1840)  morphine 2 MG/ML injection 2 mg (2 mg Intravenous Given 03/26/18 1902)     Initial Impression / Assessment and Plan / ED Course  I have reviewed the triage vital signs and the nursing notes.  Pertinent labs & imaging results that were available during my care of the patient were reviewed by me and considered in my medical decision making (see chart for details).     7:50 PM In no distress, no recurrence of SVT. I discussed all findings with the patient her daughter and her mother. We discussed the importance of following with cardiology, initiation of low-dose beta-blocker, return precautions. Absent any recurrence, with no alarming findings on x-ray, labs, EKG, patient appropriate for discharge with close outpatient follow-up.  Final Clinical  Impressions(s) / ED Diagnoses  SVT   Gerhard Munch, MD 03/26/18 [phone]

## 2018-03-26 NOTE — Discharge Instructions (Signed)
Please be sure to follow-up with our cardiology colleagues.  Take all medication as directed.  Return here for concerning changes in your condition.

## 2018-04-13 ENCOUNTER — Ambulatory Visit (INDEPENDENT_AMBULATORY_CARE_PROVIDER_SITE_OTHER): Admitting: Cardiology

## 2018-04-13 ENCOUNTER — Encounter: Payer: Self-pay | Admitting: Cardiology

## 2018-04-13 VITALS — BP 124/68 | HR 84 | Ht 66.0 in | Wt 207.0 lb

## 2018-04-13 DIAGNOSIS — M329 Systemic lupus erythematosus, unspecified: Secondary | ICD-10-CM | POA: Diagnosis not present

## 2018-04-13 DIAGNOSIS — I471 Supraventricular tachycardia: Secondary | ICD-10-CM | POA: Diagnosis not present

## 2018-04-13 NOTE — Patient Instructions (Signed)
Medication Instructions:  Your physician recommends that you continue on your current medications as directed. Please refer to the Current Medication list given to you today.  Labwork: Your physician recommends that you have the following labs drawn: TSH  Testing/Procedures: Your physician has requested that you have an echocardiogram. Echocardiography is a painless test that uses sound waves to create images of your heart. It provides your doctor with information about the size and shape of your heart and how well your heart's chambers and valves are working. This procedure takes approximately one hour. There are no restrictions for this procedure.  Your physician has requested that you have a stress echocardiogram. For further information please visit [email or web address] Please follow instruction sheet as given.  Follow-Up: Your physician recommends that you schedule a follow-up appointment in: 6 months  Any Other Special Instructions Will Be Listed Below (If Applicable).     If you need a refill on your cardiac medications before your next appointment, please call your pharmacy.   [facility]  Garey Ham, RN, BSN

## 2018-04-13 NOTE — Progress Notes (Signed)
Cardiology Office Note:    Date:  04/13/2018   ID:  Elizabeth Baxter, DOB 1975/09/17, MRN [id number]  PCP:  Elizabeth Peng, MD  Cardiologist:  Garwin Brothers, MD   Referring MD: Elizabeth Peng, MD    ASSESSMENT:    1. SVT (supraventricular tachycardia) (HCC)   2. Systemic lupus erythematosus, unspecified SLE type, unspecified organ involvement status (HCC)    PLAN:    In order of problems listed above:  1. I discussed my findings with the patient at extensive length.  Her palpitations and history suggest supraventricular tachycardia.  This is the only episode she is ever had.  She is taking metoprolol 25 mg a day and asked her to continue it.  I told her that she could use an additional dose 25 mg as needed. 2. Echocardiogram will be done to assess murmur heard on auscultation and she will undergo exercise stress testing with stress echocardiogram.  In view of palpitations and tachycardia I will also do a TSH evaluation for completing the work-up. 3. Patient will be seen in follow-up appointment in 6 months or earlier if the patient has any concerns    Medication Adjustments/Labs and Tests Ordered: Current medicines are reviewed at length with the patient today.  Concerns regarding medicines are outlined above.  Orders Placed This Encounter  Procedures  . TSH  . ECHOCARDIOGRAM STRESS TEST  . ECHOCARDIOGRAM COMPLETE   No orders of the defined types were placed in this encounter.    History of Present Illness:    Elizabeth Baxter is a 42 y.o. female who is being seen today for the evaluation of supraventricular tachycardia at the request of Elizabeth Peng, MD.  Patient is a pleasant 42 year old female.  She has past medical history of essential hypertension and systemic lupus erythematosus.  She is overall in fair health.  She mentions to me that she had an episode of palpitations.  EMS arrived and given IV medicines which felt like a flutter sensation.  Upon asking whether the EMS  told her whether it was supraventricular tachycardia and whether she received adenosine she said was mentioning the same words that they told her.  No orthopnea or PND.  This is the only time it has occurred for her.  At the time of my evaluation, the patient is alert awake oriented and in no distress.  Past Medical History:  Diagnosis Date  . Chronic pain   . GERD (gastroesophageal reflux disease)   . Lupus (HCC)   . Pulmonary embolism [facility])     Past Surgical History:  Procedure Laterality Date  . PERICARDIAL WINDOW     in 2007    Current Medications: Current Meds  Medication Sig  . amLODipine (NORVASC) 10 MG tablet Take 1 tablet (10 mg total) by mouth daily.  . cetirizine (ZYRTEC) 10 MG tablet Take by mouth.  Di Kindle SULFATE PO Take by mouth.  . hydroxychloroquine (PLAQUENIL) 200 MG tablet Take 200 mg by mouth 2 (two) times daily.  Marland Kitchen lactose free nutrition (BOOST) LIQD Take 237 mLs by mouth daily.  . metoprolol succinate (TOPROL-XL) 25 MG 24 hr tablet Take 0.5 tablets (12.5 mg total) by mouth daily.  . mycophenolate (CELLCEPT) 500 MG tablet Take 1,500 mg by mouth 2 (two) times daily.   . pantoprazole (PROTONIX) 40 MG tablet Take by mouth.  . predniSONE (DELTASONE) 20 MG tablet Take 20 mg by mouth daily with breakfast.  . SUBOXONE 8-2 MG FILM Place 0.5-1 Film under the  tongue See admin instructions. Dissolve 1 film in the mouth once a day and 0.5 film at bedtime     Allergies:   Broccoli [brassica oleracea italica]; Imuran [azathioprine]; Tramadol; Tramadol; and Azathioprine   Social History   Socioeconomic History  . Marital status: Married    Spouse name: Not on file  . Number of children: Not on file  . Years of education: Not on file  . Highest education level: Not on file  Occupational History  . Not on file  Social Needs  . Financial resource strain: Not on file  . Food insecurity:    Worry: Not on file    Inability: Not on file  . Transportation needs:     Medical: Not on file    Non-medical: Not on file  Tobacco Use  . Smoking status: Never Smoker  . Smokeless tobacco: Never Used  Substance and Sexual Activity  . Alcohol use: No  . Drug use: No    Comment: no IV Drug use in past  . Sexual activity: Yes  Lifestyle  . Physical activity:    Days per week: Not on file    Minutes per session: Not on file  . Stress: Not on file  Relationships  . Social connections:    Talks on phone: Not on file    Gets together: Not on file    Attends religious service: Not on file    Active member of club or organization: Not on file    Attends meetings of clubs or organizations: Not on file    Relationship status: Not on file  Other Topics Concern  . Not on file  Social History Narrative   ** Merged History Encounter **       Lives at home with husband and 81 yo daughter           Family History: The patient's family history includes Diabetes in her mother.  ROS:   Please see the history of present illness.    All other systems reviewed and are negative.  EKGs/Labs/Other Studies Reviewed:    The following studies were reviewed today: I reviewed hospital records including EKG.  Discussed this with the patient.   Recent Labs: 03/26/2018: BUN <5; Creatinine, Ser 0.86; Hemoglobin 11.4; Platelets 512; Potassium 3.5; Sodium 141  Recent Lipid Panel    Component Value Date/Time   CHOL 156 04/02/2012 0013   TRIG 198 (H) 04/02/2012 0013   HDL 35 (L) 04/02/2012 0013   CHOLHDL 4.5 04/02/2012 0013   VLDL 40 04/02/2012 0013   LDLCALC 81 04/02/2012 0013    Physical Exam:    VS:  BP 124/68 (BP Location: Right Arm, Patient Position: Sitting, Cuff Size: Normal)   Pulse 84   Ht 5\' 6"  (1.676 m)   Wt 207 lb (93.9 kg)   SpO2 95%   BMI 33.41 kg/m     Wt Readings from Last 3 Encounters:  04/13/18 207 lb (93.9 kg)  03/26/18 207 lb (93.9 kg)  03/18/17 240 lb (108.9 kg)     GEN: Patient is in no acute distress HEENT: Normal NECK: No JVD;  No carotid bruits LYMPHATICS: No lymphadenopathy CARDIAC: S1 S2 regular, 2/6 systolic murmur at the apex. RESPIRATORY:  Clear to auscultation without rales, wheezing or rhonchi  ABDOMEN: Soft, non-tender, non-distended MUSCULOSKELETAL:  No edema; No deformity  SKIN: Warm and dry NEUROLOGIC:  Alert and oriented x 3 PSYCHIATRIC:  Normal affect    Signed, Garwin Brothers, MD  04/13/2018  4:22 PM    [facility]

## 2018-04-14 LAB — TSH: TSH: 0.682 u[IU]/mL (ref 0.450–4.500)

## 2018-04-15 ENCOUNTER — Ambulatory Visit (HOSPITAL_BASED_OUTPATIENT_CLINIC_OR_DEPARTMENT_OTHER)
Admission: RE | Admit: 2018-04-15 | Discharge: 2018-04-15 | Disposition: A | Discharge status: Home / Self Care | Source: Ambulatory Visit | Attending: Cardiology | Admitting: Cardiology

## 2018-04-15 ENCOUNTER — Telehealth: Payer: Self-pay

## 2018-04-15 DIAGNOSIS — I471 Supraventricular tachycardia: Secondary | ICD-10-CM | POA: Diagnosis not present

## 2018-04-15 NOTE — Telephone Encounter (Signed)
Left voicemail for the patient to call the office to discuss echo results.

## 2018-04-15 NOTE — Telephone Encounter (Signed)
-----   Message from Garwin Brothers, MD sent at 04/15/2018 12:34 PM EDT ----- The results of the study is unremarkable. Please inform patient.  I would like her to get a CT of the chest to assess the aorta.  Please check with radiology whether contrast is preferred.  Please make sure that she is not pregnant and we may need to do urine test to a certain this before the CT scan.  I will discuss in detail at next appointment. Cc  primary care/referring physician Garwin Brothers, MD 04/15/2018 12:33 PM

## 2018-04-15 NOTE — Progress Notes (Signed)
  Echocardiogram 2D Echocardiogram has been performed.  Lezly Rumpf T Kenyatta Keidel 04/15/2018, 10:45 AM

## 2018-04-16 ENCOUNTER — Other Ambulatory Visit: Payer: Self-pay

## 2018-04-16 DIAGNOSIS — R931 Abnormal findings on diagnostic imaging of heart and coronary circulation: Secondary | ICD-10-CM

## 2018-04-19 ENCOUNTER — Telehealth: Payer: Self-pay | Admitting: *Deleted

## 2018-04-19 ENCOUNTER — Ambulatory Visit (HOSPITAL_BASED_OUTPATIENT_CLINIC_OR_DEPARTMENT_OTHER): Admission: RE | Admit: 2018-04-19 | Source: Ambulatory Visit

## 2018-04-19 NOTE — Telephone Encounter (Signed)
Gave pt number of imaging dept so she could reschedule her appt and she thanked me and said she would do that.

## 2018-04-19 NOTE — Telephone Encounter (Signed)
Pt called to let us know is on side of road with flat tire and is unable to make her appt for echo. I let the Imaging dept know and will call pt back at later time to give her the number to reschedule her appt.

## 2018-04-22 ENCOUNTER — Encounter: Payer: Self-pay | Admitting: Cardiology

## 2018-04-29 ENCOUNTER — Ambulatory Visit: Admitting: Cardiovascular Disease

## 2018-05-10 ENCOUNTER — Ambulatory Visit (HOSPITAL_COMMUNITY)
Admission: RE | Admit: 2018-05-10 | Discharge: 2018-05-10 | Disposition: A | Discharge status: Home / Self Care | Source: Ambulatory Visit | Attending: Cardiology | Admitting: Cardiology

## 2018-05-10 ENCOUNTER — Encounter (HOSPITAL_COMMUNITY): Payer: Self-pay

## 2018-05-10 ENCOUNTER — Encounter (HOSPITAL_COMMUNITY): Payer: Self-pay | Admitting: Emergency Medicine

## 2018-05-10 ENCOUNTER — Ambulatory Visit (HOSPITAL_COMMUNITY)
Admission: EM | Admit: 2018-05-10 | Discharge: 2018-05-10 | Disposition: A | Discharge status: Home / Self Care | Attending: Family Medicine | Admitting: Family Medicine

## 2018-05-10 DIAGNOSIS — L0501 Pilonidal cyst with abscess: Secondary | ICD-10-CM

## 2018-05-10 DIAGNOSIS — R931 Abnormal findings on diagnostic imaging of heart and coronary circulation: Secondary | ICD-10-CM

## 2018-05-10 MED ORDER — AMOXICILLIN-POT CLAVULANATE 875-125 MG PO TABS: 1.0000 | ORAL_TABLET | Freq: Two times a day (BID) | ORAL | 0 refills | Status: AC

## 2018-05-10 MED ORDER — HYDROCODONE-ACETAMINOPHEN 5-325 MG PO TABS: 1.0000 | ORAL_TABLET | Freq: Four times a day (QID) | ORAL | 0 refills | Status: DC | PRN

## 2018-05-10 NOTE — ED Triage Notes (Signed)
PT reports large boil on buttocks that is draining. Has been present for 4 days.

## 2018-05-10 NOTE — Discharge Instructions (Signed)
Please begin Augmentin twice daily for 1 week  Apply warm compresses/hot rags to area with massage to express further drainage especially the first 24-48 hours  Use anti-inflammatories for pain/swelling. You may take up to 800 mg Ibuprofen every 8 hours with food. You may supplement Ibuprofen with Tylenol [phone] mg every 8 hours.   Return if symptoms returning or not improving

## 2018-05-10 NOTE — Progress Notes (Signed)
Pt came here today for Cardiac MRI.  When put in the scanner, pt became very claustrophobic, SOB and tachy.  She did not want to continue with the exam.  Told her to make contact with physician to see what the next step is.  We do not recommend sedation for scans because of the need for the patient to cooperate is high on these exams.

## 2018-05-11 DIAGNOSIS — L0501 Pilonidal cyst with abscess: Secondary | ICD-10-CM

## 2018-05-11 NOTE — ED Provider Notes (Signed)
[facility]    CSN: [id number] Arrival date & time: 05/10/18  1207     History   Chief Complaint Chief Complaint  Patient presents with  . Abscess    HPI Elizabeth Baxter is a 42 y.o. female history of lupus, pulmonary embolism, presenting today for evaluation of an abscess.  Patient states that she has had a abscess on her buttocks, it is been present for the past 4 days.  It has begun to drain.  She denies previous issues similar to this.  Having significant pain and difficulty sitting due to location.  She has tried but will ease for this.  Symptoms have continued to worsen. HPI  Past Medical History:  Diagnosis Date  . Chronic pain   . GERD (gastroesophageal reflux disease)   . Lupus (HCC)   . Pulmonary embolism [facility])     Patient Active Problem List   Diagnosis Date Noted  . SVT (supraventricular tachycardia) (HCC) 04/13/2018  . Vasculitis (HCC) 03/27/2015  . Exacerbation of systemic lupus (HCC)   . Hip pain   . Myalgia 03/26/2015  . Infection of index finger 03/26/2015  . Right hip pain 03/26/2015  . Lupus (HCC)   . Pulmonary embolism (HCC)   . Tachycardia   . Systemic lupus erythematosus (HCC) 01/20/2013  . Orthostatic hypotension 08/02/2012  . Normocytic anemia 07/31/2012  . Hypokalemia 07/29/2012  . Lupus erythematosus 07/29/2012  . Pancreatitis, acute 07/29/2012  . Colitis, acute 07/29/2012  . Dehydration 07/25/2012  . Nausea and vomiting 07/25/2012  . Hypotension 07/25/2012  . SLE exacerbation (HCC) 07/25/2012  . Chronic pain 07/25/2012  . GERD (gastroesophageal reflux disease) 07/25/2012  . History of pulmonary embolism 07/25/2012    Past Surgical History:  Procedure Laterality Date  . PERICARDIAL WINDOW     in 2007    OB History   None      Home Medications    Prior to Admission medications   Medication Sig Start Date End Date Taking? Authorizing Provider  amLODipine (NORVASC) 10 MG tablet Take 1 tablet (10 mg total) by  mouth daily. 03/27/15   Zannie Cove, MD  amoxicillin-clavulanate (AUGMENTIN) 875-125 MG tablet Take 1 tablet by mouth every 12 (twelve) hours for 7 days. 05/10/18 05/17/18  Wieters, Hallie C, PA-C  cetirizine (ZYRTEC) 10 MG tablet Take by mouth.    [provider]  FERROUS SULFATE PO Take by mouth.    [provider]  HYDROcodone-acetaminophen (NORCO/VICODIN) 5-325 MG tablet Take 1 tablet by mouth every 6 (six) hours as needed. 05/10/18   Wieters, Hallie C, PA-C  hydroxychloroquine (PLAQUENIL) 200 MG tablet Take 200 mg by mouth 2 (two) times daily.    [provider]  lactose free nutrition (BOOST) LIQD Take 237 mLs by mouth daily.    [provider]  metoprolol succinate (TOPROL-XL) 25 MG 24 hr tablet Take 0.5 tablets (12.5 mg total) by mouth daily. 03/26/18 04/25/18  Gerhard Munch, MD  mycophenolate (CELLCEPT) 500 MG tablet Take 1,500 mg by mouth 2 (two) times daily.     [provider]  pantoprazole (PROTONIX) 40 MG tablet Take by mouth.    [provider]  predniSONE (DELTASONE) 20 MG tablet Take 20 mg by mouth daily with breakfast.    [provider]  SUBOXONE 8-2 MG FILM Place 0.5-1 Film under the tongue See admin instructions. Dissolve 1 film in the mouth once a day and 0.5 film at bedtime 01/26/18   [provider]  Family History Family History  Problem Relation Age of Onset  . Diabetes Mother     Social History Social History   Tobacco Use  . Smoking status: Never Smoker  . Smokeless tobacco: Never Used  Substance Use Topics  . Alcohol use: No  . Drug use: No    Comment: no IV Drug use in past     Allergies   Broccoli [brassica oleracea italica]; Imuran [azathioprine]; Tramadol; Tramadol; and Azathioprine   Review of Systems Review of Systems  Constitutional: Negative for fatigue and fever.  HENT: Negative for mouth sores.   Eyes: Negative for visual disturbance.  Respiratory: Negative for  shortness of breath.   Cardiovascular: Negative for chest pain.  Gastrointestinal: Negative for abdominal pain, anal bleeding, constipation, diarrhea, nausea, rectal pain and vomiting.  Musculoskeletal: Negative for arthralgias and joint swelling.  Skin: Positive for color change. Negative for rash and wound.  Neurological: Negative for dizziness, weakness, light-headedness and headaches.     Physical Exam Triage Vital Signs ED Triage Vitals  Enc Vitals Group     BP 05/10/18 1312 113/67     Pulse Rate 05/10/18 1312 97     Resp 05/10/18 1312 16     Temp 05/10/18 1312 98.4 F (36.9 C)     Temp Source 05/10/18 1312 Oral     SpO2 05/10/18 1312 98 %     Weight --      Height --      Head Circumference --      Peak Flow --      Pain Score 05/10/18 1314 8     Pain Loc --      Pain Edu? --      Excl. in GC? --    No data found.  Updated Vital Signs BP 113/67   Pulse 97   Temp 98.4 F (36.9 C) (Oral)   Resp 16   LMP 04/01/2018   SpO2 98%   Visual Acuity Right Eye Distance:   Left Eye Distance:   Bilateral Distance:    Right Eye Near:   Left Eye Near:    Bilateral Near:     Physical Exam  Constitutional: She is oriented to person, place, and time. She appears well-developed and well-nourished.  No acute distress  HENT:  Head: Normocephalic and atraumatic.  Nose: Nose normal.  Eyes: Conjunctivae are normal.  Neck: Neck supple.  Cardiovascular: Normal rate.  Pulmonary/Chest: Effort normal. No respiratory distress.  Abdominal: She exhibits no distension.  Genitourinary:  Genitourinary Comments: Upper gluteal cleft/pilonidal area with induration, erythema and actively draining area to right buttock, significant tenderness in this area, does not extend towards rectum, small involvement of left buttock  Musculoskeletal: Normal range of motion.  Neurological: She is alert and oriented to person, place, and time.  Skin: Skin is warm and dry.  Psychiatric: She has a  normal mood and affect.  Nursing note and vitals reviewed.    UC Treatments / Results  Labs (all labs ordered are listed, but only abnormal results are displayed) Labs Reviewed - No data to display  EKG None  Radiology No results found.  Procedures Incision and Drainage Date/Time: 05/11/2018 3:22 PM Performed by: Wieters, Junius Creamer, PA-C Authorized by: Mardella Layman, MD   Consent:    Consent obtained:  Verbal   Consent given by:  Patient   Risks discussed:  Bleeding, pain, incomplete drainage and damage to other organs   Alternatives discussed:  No treatment Location:  Type:  Abscess   Size:  4 cm   Location:  Anogenital   Anogenital location:  Pilonidal Pre-procedure details:    Skin preparation:  Betadine Anesthesia (see MAR for exact dosages):    Anesthesia method:  Local infiltration   Local anesthetic:  Lidocaine 2% WITH epi Procedure type:    Complexity:  Simple Procedure details:    Needle aspiration: no     Incision types:  Single straight   Incision depth:  Subcutaneous   Scalpel blade:  11   Wound management:  Probed and deloculated   Drainage:  Purulent and bloody   Drainage amount:  Moderate   Wound treatment:  Wound left open   Packing materials:  None Post-procedure details:    Patient tolerance of procedure:  Tolerated well, no immediate complications   (including critical care time)  Medications Ordered in UC Medications - No data to display  Initial Impression / Assessment and Plan / UC Course  I have reviewed the triage vital signs and the nursing notes.  Pertinent labs & imaging results that were available during my care of the patient were reviewed by me and considered in my medical decision making (see chart for details).     Patient with pilonidal abscess, I&D performed to express further drainage and allow opening to continue to drain, beginning on Augmentin, advised to apply warm compresses and soaks to help soften tissue as well  as express further drainage.  Follow-up if symptoms returning, worsening, not improving with taking antibiotic or pain persisting.  Patient was provided with 2 days worth of hydrocodone as patient was in significant pain and discomfort from this, patient is normally on Suboxone from her lupus, does not have a history of opioid abuse, she has been off the Suboxone for 3 days.  Advised to use for severe pain.  Discussed strict return precautions. Patient verbalized understanding and is agreeable with plan.  Final Clinical Impressions(s) / UC Diagnoses   Final diagnoses:  Pilonidal abscess     Discharge Instructions     Please begin Augmentin twice daily for 1 week  Apply warm compresses/hot rags to area with massage to express further drainage especially the first 24-48 hours  Use anti-inflammatories for pain/swelling. You may take up to 800 mg Ibuprofen every 8 hours with food. You may supplement Ibuprofen with Tylenol [phone] mg every 8 hours.   Return if symptoms returning or not improving   ED Prescriptions    Medication Sig Dispense Auth. Provider   amoxicillin-clavulanate (AUGMENTIN) 875-125 MG tablet Take 1 tablet by mouth every 12 (twelve) hours for 7 days. 14 tablet Wieters, Hallie C, PA-C   HYDROcodone-acetaminophen (NORCO/VICODIN) 5-325 MG tablet Take 1 tablet by mouth every 6 (six) hours as needed. 8 tablet Wieters, [place] C, PA-C     Controlled Substance Prescriptions [place] Controlled Substance Registry consulted? Not Applicable   Lew Dawes, [place] 05/11/18 1525

## 2018-10-21 ENCOUNTER — Ambulatory Visit (HOSPITAL_COMMUNITY)
Admission: EM | Admit: 2018-10-21 | Discharge: 2018-10-21 | Disposition: A | Discharge status: Home / Self Care | Attending: Family Medicine | Admitting: Family Medicine

## 2018-10-21 ENCOUNTER — Other Ambulatory Visit: Payer: Self-pay

## 2018-10-21 ENCOUNTER — Encounter (HOSPITAL_COMMUNITY): Payer: Self-pay | Admitting: Emergency Medicine

## 2018-10-21 DIAGNOSIS — I7300 Raynaud's syndrome without gangrene: Secondary | ICD-10-CM | POA: Diagnosis not present

## 2018-10-21 MED ORDER — AMLODIPINE BESYLATE 10 MG PO TABS: 10.0000 mg | ORAL_TABLET | Freq: Every day | ORAL | 1 refills | Status: AC

## 2018-10-21 NOTE — Discharge Instructions (Signed)
Re start the amlodipine Keep hands warm Follow up with your physicians

## 2018-10-21 NOTE — ED Notes (Signed)
Patient able to ambulate independently  

## 2018-10-21 NOTE — ED Triage Notes (Signed)
Pt presents to [facility] for assessment of raynaud's flare up.  States she was on something for it, but she ran out a week ago.  States it was getting worse even then.  States the white area is spreading further down her hand, and it is becoming more painful.

## 2018-10-21 NOTE — ED Provider Notes (Signed)
[facility]    CSN: [id number] Arrival date & time: 10/21/18  1452     History   Chief Complaint Chief Complaint  Patient presents with  . Hand Pain       HPI Elizabeth Baxter is a 43 y.o. female.   HPI  Patient with history of Raynaud's disease presents with painful, purple L middle finger. She reports that she has a prescription for amlodipine that her rheumatologist prescribes to manage her disease, she ran out of her medication last week but states that she was having pain before she ran out of her meds. She states that her finger became much more painful and began turning purple on Monday. She has an appointment with her rheumatologist next Monday, she contacted them and they advised her to wear gloves and keep her hands warm. She states that pain is only tolerable when she makes a fist.  She is on Suboxone.  She reported to her pain provider that she was having severe pain in her finger.  He prescribe her some oxycodone 10 mg.  She states that this was not strong enough.  I advised her that I would not be able to prescribe her any pain medicine stronger than oxycodone 10.  She needs to follow-up with her pain provider and her rheumatology provider for this condition.  If she gets worse instead of better she may need to go to the emergency room.  Past Medical History:  Diagnosis Date  . Chronic pain   . GERD (gastroesophageal reflux disease)   . Lupus (HCC)   . Pulmonary embolism [facility])     Patient Active Problem List   Diagnosis Date Noted  . SVT (supraventricular tachycardia) (HCC) 04/13/2018  . Vasculitis (HCC) 03/27/2015  . Exacerbation of systemic lupus (HCC)   . Hip pain   . Myalgia 03/26/2015  . Infection of index finger 03/26/2015  . Right hip pain 03/26/2015  . Lupus (HCC)   . Pulmonary embolism (HCC)   . Tachycardia   . Systemic lupus erythematosus (HCC) 01/20/2013  . Orthostatic hypotension 08/02/2012  . Normocytic anemia 07/31/2012  . Hypokalemia  07/29/2012  . Lupus erythematosus 07/29/2012  . Pancreatitis, acute 07/29/2012  . Colitis, acute 07/29/2012  . Dehydration 07/25/2012  . Nausea and vomiting 07/25/2012  . Hypotension 07/25/2012  . SLE exacerbation (HCC) 07/25/2012  . Chronic pain 07/25/2012  . GERD (gastroesophageal reflux disease) 07/25/2012  . History of pulmonary embolism 07/25/2012    Past Surgical History:  Procedure Laterality Date  . PERICARDIAL WINDOW     in 2007    OB History   No obstetric history on file.      Home Medications    Prior to Admission medications   Medication Sig Start Date End Date Taking? Authorizing Provider  amLODipine (NORVASC) 10 MG tablet Take 1 tablet (10 mg total) by mouth daily. 10/21/18   Eustace Moore, MD  cetirizine (ZYRTEC) 10 MG tablet Take by mouth.    [provider]  FERROUS SULFATE PO Take by mouth.    [provider]  hydroxychloroquine (PLAQUENIL) 200 MG tablet Take 200 mg by mouth 2 (two) times daily.    [provider]  lactose free nutrition (BOOST) LIQD Take 237 mLs by mouth daily.    [provider]  metoprolol succinate (TOPROL-XL) 25 MG 24 hr tablet Take 0.5 tablets (12.5 mg total) by mouth daily. 03/26/18 04/25/18  Gerhard Munch, MD  mycophenolate (CELLCEPT) 500 MG tablet Take 1,500  mg by mouth 2 (two) times daily.     [provider]  pantoprazole (PROTONIX) 40 MG tablet Take by mouth.    [provider]  predniSONE (DELTASONE) 20 MG tablet Take 20 mg by mouth daily with breakfast.    [provider]  SUBOXONE 8-2 MG FILM Place 0.5-1 Film under the tongue See admin instructions. Dissolve 1 film in the mouth once a day and 0.5 film at bedtime 01/26/18   [provider]    Family History Family History  Problem Relation Age of Onset  . Diabetes Mother     Social History Social History   Tobacco Use  . Smoking status: Never Smoker  . Smokeless tobacco: Never Used   Substance Use Topics  . Alcohol use: No  . Drug use: No    Comment: no IV Drug use in past     Allergies   Broccoli [brassica oleracea italica]; Imuran [azathioprine]; Tramadol; Tramadol; and Azathioprine   Review of Systems Review of Systems  Constitutional: Negative for chills and fever.  HENT: Negative for ear pain and sore throat.   Eyes: Negative for pain and visual disturbance.  Respiratory: Negative for cough and shortness of breath.   Cardiovascular: Negative for chest pain and palpitations.  Gastrointestinal: Negative for abdominal pain and vomiting.  Genitourinary: Negative for dysuria and hematuria.  Musculoskeletal: Negative for arthralgias and back pain.  Skin: Positive for pallor. Negative for color change and rash.  Neurological: Negative for seizures and syncope.  All other systems reviewed and are negative.    Physical Exam Triage Vital Signs ED Triage Vitals  Enc Vitals Group     BP 10/21/18 1524 112/78     Pulse Rate 10/21/18 1524 (!) 103     Resp 10/21/18 1524 16     Temp 10/21/18 1524 98.1 F (36.7 C)     Temp Source 10/21/18 1524 Oral     SpO2 10/21/18 1524 97 %     Weight --      Height --      Head Circumference --      Peak Flow --      Pain Score 10/21/18 1526 10     Pain Loc --      Pain Edu? --      Excl. in GC? --    No data found.  Updated Vital Signs BP 112/78 (BP Location: Right Arm)   Pulse (!) 103   Temp 98.1 F (36.7 C) (Oral)   Resp 16   SpO2 97%       Physical Exam Constitutional:      General: She is not in acute distress.    Appearance: She is well-developed.  HENT:     Head: Normocephalic and atraumatic.  Eyes:     Conjunctiva/sclera: Conjunctivae normal.     Pupils: Pupils are equal, round, and reactive to light.  Neck:     Musculoskeletal: Normal range of motion.  Cardiovascular:     Rate and Rhythm: Normal rate.  Pulmonary:     Effort: Pulmonary effort is normal. No respiratory distress.  Abdominal:      General: There is no distension.     Palpations: Abdomen is soft.  Musculoskeletal: Normal range of motion.  Skin:    General: Skin is warm and dry.     Comments: See photos.  Long finger is ischemic and very painful.  Tip is pale.  See picture.  Neurological:     Mental Status: She  is alert.          UC Treatments / Results  Labs (all labs ordered are listed, but only abnormal results are displayed) Labs Reviewed - No data to display  EKG None  Radiology No results found.  Procedures Procedures (including critical care time)  Medications Ordered in UC Medications - No data to display  Initial Impression / Assessment and Plan / UC Course  I have reviewed the triage vital signs and the nursing notes.  Pertinent labs & imaging results that were available during my care of the patient were reviewed by me and considered in my medical decision making (see chart for details).     I explained to the patient that her fingertip was ischemic.  This was a dysplastic phenomenon consistent with ray nodes but was a long-term deficiency in the arterial circulation to her fingertips.  Uncertain whether she will end up with gangrene and amputation like she did her adjacent fingertip.  She is going to call her rheumatologist tomorrow to try to get seen sooner.  Keep her finger warm.  I restarted her on amlodipine,  if she needs pain management she will need to go to the emergency department. Final Clinical Impressions(s) / UC Diagnoses   Final diagnoses:  Raynaud's disease without gangrene     Discharge Instructions     Re start the amlodipine Keep hands warm Follow up with your physicians   ED Prescriptions    Medication Sig Dispense Auth. Provider   amLODipine (NORVASC) 10 MG tablet Take 1 tablet (10 mg total) by mouth daily. 30 tablet Eustace Moore, MD     Controlled Substance Prescriptions [place] Controlled Substance Registry consulted? Not Applicable   Eustace Moore, MD 10/21/18 1625

## 2018-11-24 ENCOUNTER — Emergency Department (HOSPITAL_COMMUNITY)

## 2018-11-24 ENCOUNTER — Encounter (HOSPITAL_COMMUNITY): Payer: Self-pay | Admitting: Emergency Medicine

## 2018-11-24 ENCOUNTER — Other Ambulatory Visit: Payer: Self-pay

## 2018-11-24 ENCOUNTER — Emergency Department (HOSPITAL_COMMUNITY)
Admission: EM | Admit: 2018-11-24 | Discharge: 2018-11-24 | Disposition: A | Discharge status: Home / Self Care | Attending: Emergency Medicine | Admitting: Emergency Medicine

## 2018-11-24 DIAGNOSIS — M25552 Pain in left hip: Secondary | ICD-10-CM | POA: Insufficient documentation

## 2018-11-24 DIAGNOSIS — Z79899 Other long term (current) drug therapy: Secondary | ICD-10-CM | POA: Insufficient documentation

## 2018-11-24 DIAGNOSIS — M3210 Systemic lupus erythematosus, organ or system involvement unspecified: Secondary | ICD-10-CM | POA: Insufficient documentation

## 2018-11-24 MED ORDER — GABAPENTIN 300 MG PO CAPS: 300.0000 mg | ORAL_CAPSULE | Freq: Three times a day (TID) | ORAL | 0 refills | Status: DC

## 2018-11-24 MED ORDER — GABAPENTIN 300 MG PO CAPS: 300.0000 mg | ORAL_CAPSULE | Freq: Three times a day (TID) | ORAL | 0 refills | Status: AC

## 2018-11-24 MED ORDER — PREDNISONE 20 MG PO TABS: 20.0000 mg | ORAL_TABLET | Freq: Every day | ORAL | 30 refills | Status: DC

## 2018-11-24 MED ORDER — DEXAMETHASONE SODIUM PHOSPHATE 10 MG/ML IJ SOLN
10.0000 mg | Freq: Once | INTRAMUSCULAR | Status: AC
  Administered 2018-11-24: 14:00:00 10 mg via INTRAMUSCULAR
  Filled 2018-11-24: qty 1

## 2018-11-24 NOTE — ED Provider Notes (Signed)
[facility] EMERGENCY DEPARTMENT Provider Note   CSN: [id number] Arrival date & time: 11/24/18  1258    History   Chief Complaint Chief Complaint  Patient presents with  . Hip Pain    HPI Elizabeth Baxter is a 43 y.o. female.     HPI  The patient is a 43 year old female, she has a known history of lupus, she has chronic pain, she currently is taking 20 mg of prednisone daily as well as a combination of both oxycodone and Suboxone for her chronic pain.  She states that she is in a pain clinic, that is who prescribes these medications to her.  She reports that she is currently staying with her [place] and has been doing some light farm work, nothing heavy duty, no trauma, no significant lifting and she does not recall any acute pain however several days ago had developed some left-sided hip pain, this is progressively worsened and radiates down her left leg starting from the left hip and buttock going to the anterolateral leg and then posteriorly into the calf and the foot.  It is associated with a tingling sensation.  She has had an antalgic gait compensating for the pain but denies any focal numbness, denies any fevers chills nausea vomiting history of cancer or IV drug use.  This pain has been severe at times, it is definitely relieved by changing position and is not associated with any overlying rashes.  She reports that it does get better when she puts pressure just underneath her iliac wing.  Past Medical History:  Diagnosis Date  . Chronic pain   . GERD (gastroesophageal reflux disease)   . Lupus (HCC)   . Pulmonary embolism [facility])     Patient Active Problem List   Diagnosis Date Noted  . SVT (supraventricular tachycardia) (HCC) 04/13/2018  . Vasculitis (HCC) 03/27/2015  . Exacerbation of systemic lupus (HCC)   . Hip pain   . Myalgia 03/26/2015  . Infection of index finger 03/26/2015  . Right hip pain 03/26/2015  . Lupus (HCC)   . Pulmonary embolism  (HCC)   . Tachycardia   . Systemic lupus erythematosus (HCC) 01/20/2013  . Orthostatic hypotension 08/02/2012  . Normocytic anemia 07/31/2012  . Hypokalemia 07/29/2012  . Lupus erythematosus 07/29/2012  . Pancreatitis, acute 07/29/2012  . Colitis, acute 07/29/2012  . Dehydration 07/25/2012  . Nausea and vomiting 07/25/2012  . Hypotension 07/25/2012  . SLE exacerbation (HCC) 07/25/2012  . Chronic pain 07/25/2012  . GERD (gastroesophageal reflux disease) 07/25/2012  . History of pulmonary embolism 07/25/2012    Past Surgical History:  Procedure Laterality Date  . PERICARDIAL WINDOW     in 2007     OB History    Gravida  2   Para  1   Term  1   Preterm      AB  1   Living        SAB  1   TAB      Ectopic      Multiple      Live Births               Home Medications    Prior to Admission medications   Medication Sig Start Date End Date Taking? Authorizing Provider  amLODipine (NORVASC) 10 MG tablet Take 1 tablet (10 mg total) by mouth daily. 10/21/18  Yes Eustace Moore, MD  cetirizine (ZYRTEC) 10 MG tablet Take by mouth.   Yes  [provider]  hydroxychloroquine (PLAQUENIL) 200 MG tablet Take 200 mg by mouth 2 (two) times daily.   Yes [provider]  mycophenolate (CELLCEPT) 500 MG tablet Take 1,500 mg by mouth 2 (two) times daily.    Yes [provider]  Oxycodone HCl 20 MG TABS Take 1 tablet by mouth every 6 (six) hours as needed. 11/19/18  Yes [provider]  predniSONE (DELTASONE) 20 MG tablet Take 20 mg by mouth daily with breakfast.   Yes [provider]  SUBOXONE 8-2 MG FILM Place 0.5-1 Film under the tongue See admin instructions. Dissolve 1 film in the mouth once a day and 0.5 film at bedtime 01/26/18  Yes [provider]  gabapentin (NEURONTIN) 300 MG capsule Take 1 capsule (300 mg total) by mouth 3 (three) times daily. 11/24/18   Eber HongMiller, Quincie Haroon, MD  metoprolol succinate (TOPROL-XL) 25 MG  24 hr tablet Take 0.5 tablets (12.5 mg total) by mouth daily. 03/26/18 04/25/18  Gerhard MunchLockwood, Robert, MD    Family History Family History  Problem Relation Age of Onset  . Diabetes Mother     Social History Social History   Tobacco Use  . Smoking status: Never Smoker  . Smokeless tobacco: Never Used  Substance Use Topics  . Alcohol use: No  . Drug use: No    Comment: no IV Drug use in past     Allergies   Broccoli [brassica oleracea italica]; Imuran [azathioprine]; Tramadol; Tramadol; and Azathioprine   Review of Systems Review of Systems  All other systems reviewed and are negative.    Physical Exam Updated Vital Signs BP 112/78   Pulse 96   Resp 18   Ht 1.676 m (5\' 6" )   Wt 83 kg   LMP 11/18/2018   SpO2 96%   BMI 29.54 kg/m   Physical Exam Vitals signs and nursing note reviewed.  Constitutional:      General: She is not in acute distress.    Appearance: She is well-developed.  HENT:     Head: Normocephalic and atraumatic.     Mouth/Throat:     Pharynx: No oropharyngeal exudate.  Eyes:     General: No scleral icterus.       Right eye: No discharge.        Left eye: No discharge.     Conjunctiva/sclera: Conjunctivae normal.     Pupils: Pupils are equal, round, and reactive to light.  Neck:     Musculoskeletal: Normal range of motion and neck supple.     Thyroid: No thyromegaly.     Vascular: No JVD.  Cardiovascular:     Rate and Rhythm: Normal rate and regular rhythm.     Heart sounds: Normal heart sounds. No murmur. No friction rub. No gallop.   Pulmonary:     Effort: Pulmonary effort is normal. No respiratory distress.     Breath sounds: Normal breath sounds. No wheezing or rales.  Musculoskeletal: Normal range of motion.        General: Tenderness present.     Comments: There is some tenderness to palpation over the left hip, no significant tenderness over the bursa however there is more tenderness posteriorly into the buttock muscle and down the  lateral thigh.  She has good range of motion to both flexion and extension at the hip, she tolerates internal rotation but on external rotation she does have some tenderness.  She is able to both flex and extend at the knee without any weakness  as well as the foot to dorsiflex and plantarflex without any weakness.  Her sensation is intact except for some paresthesias that she feels on the medial foot, the lateral thigh.  All other extremities appear normal, there is no edema  Lymphadenopathy:     Cervical: No cervical adenopathy.  Skin:    General: Skin is warm and dry.     Findings: No erythema or rash.  Neurological:     Mental Status: She is alert.     Coordination: Coordination normal.     Comments: See above in musculoskeletal section.  Speech is clear, coordination is normal  Psychiatric:        Behavior: Behavior normal.      ED Treatments / Results  Labs (all labs ordered are listed, but only abnormal results are displayed) Labs Reviewed - No data to display  EKG None  Radiology Dg Hip Unilat W Or Wo Pelvis 2-3 Views Left  Result Date: 11/24/2018 CLINICAL DATA:  Left hip pain EXAM: DG HIP (WITH OR WITHOUT PELVIS) 2-3V LEFT COMPARISON:  None. FINDINGS: There is no evidence of hip fracture or dislocation. There is no evidence of arthropathy or other focal bone abnormality. IMPRESSION: Negative. Electronically Signed   By: Elige Ko   On: 11/24/2018 14:25    Procedures Procedures (including critical care time)  Medications Ordered in ED Medications  dexamethasone (DECADRON) injection 10 mg (10 mg Intramuscular Given 11/24/18 1402)     Initial Impression / Assessment and Plan / ED Course  I have reviewed the triage vital signs and the nursing notes.  Pertinent labs & imaging results that were available during my care of the patient were reviewed by me and considered in my medical decision making (see chart for details).        The patient's history of lupus and  her chronic steroid use raises suspicion for possible avascular necrosis for which we will obtain an x-ray to rule out.  I doubt that there is a pathologic fracture given no history of cancer, this could be a bursitis but seems to be more neurologic in origin.  There is no tenderness over the spine at all but tenderness located throughout the buttock down into the hip suggest possible pinched nerve.  The burning and stinging sensation of the pain also suggest a neuropathy.  At this time will give increased dose of steroid at 10 mg of Decadron and consider Neurontin, x-ray to rule out pathologic findings of the hip.  Personally looked at the x-ray of the patient's left hip.  Multiple views including the pelvis, there is no signs of fracture of the pelvis, there is no signs of degeneration of the hip or avascular necrosis or fracture or dislocation.  The patient will be informed, she will be prescribed Neurontin 300 mg 3 times daily to help with the neuropathic pain which is likely causing this and referred to her family doctor if this does not get any better, she is agreeable.  Vitals:   11/24/18 1306 11/24/18 1315  BP:  112/78  Pulse:  96  Resp:  18  SpO2:  96%  Weight: 83 kg   Height: 1.676 m (5\' 6" )      Final Clinical Impressions(s) / ED Diagnoses   Final diagnoses:  Left hip pain    ED Discharge Orders         Ordered    gabapentin (NEURONTIN) 300 MG capsule  3 times daily     11/24/18 1454  Eber Hong, MD 11/24/18 1459

## 2018-11-24 NOTE — ED Notes (Signed)
PT ambulated to bathroom at this time. D/c instructions with prescriptions given to pt at this this time.

## 2018-11-24 NOTE — ED Triage Notes (Signed)
Patient reports L hip pain that started on Friday. No known injury. Patient states she has Lupus. Patient reports taking NSAIDS without relief, difficulty bearing weight.

## 2018-11-24 NOTE — Discharge Instructions (Signed)
Your xray looks normal -  There are no signs of fracture of complications of the steroid therapy. Please take the Gabapentin 300mg  3 times daily - this may help with the pain Like any other new medication this may cause some side effects however most people tolerate it very well.  Be aware that it can cause sleepiness.  You should follow-up with your family doctor in 2 to 3 days for a recheck but come back for the increasing in pain, redness, swelling or fever.

## 2018-12-06 ENCOUNTER — Emergency Department (HOSPITAL_COMMUNITY)
Admission: EM | Admit: 2018-12-06 | Discharge: 2018-12-06 | Disposition: A | Discharge status: Home / Self Care | Attending: Emergency Medicine | Admitting: Emergency Medicine

## 2018-12-06 ENCOUNTER — Encounter (HOSPITAL_COMMUNITY): Payer: Self-pay

## 2018-12-06 ENCOUNTER — Other Ambulatory Visit: Payer: Self-pay

## 2018-12-06 ENCOUNTER — Emergency Department (HOSPITAL_COMMUNITY)

## 2018-12-06 DIAGNOSIS — M3210 Systemic lupus erythematosus, organ or system involvement unspecified: Secondary | ICD-10-CM | POA: Diagnosis not present

## 2018-12-06 DIAGNOSIS — M79645 Pain in left finger(s): Secondary | ICD-10-CM | POA: Diagnosis present

## 2018-12-06 DIAGNOSIS — I7300 Raynaud's syndrome without gangrene: Secondary | ICD-10-CM | POA: Insufficient documentation

## 2018-12-06 DIAGNOSIS — L089 Local infection of the skin and subcutaneous tissue, unspecified: Secondary | ICD-10-CM | POA: Diagnosis not present

## 2018-12-06 DIAGNOSIS — D649 Anemia, unspecified: Secondary | ICD-10-CM | POA: Insufficient documentation

## 2018-12-06 DIAGNOSIS — S60949A Unspecified superficial injury of unspecified finger, initial encounter: Secondary | ICD-10-CM

## 2018-12-06 MED ORDER — DOXYCYCLINE HYCLATE 100 MG PO TABS
100.0000 mg | ORAL_TABLET | Freq: Once | ORAL | Status: AC
  Administered 2018-12-06: 100 mg via ORAL
  Filled 2018-12-06: qty 1

## 2018-12-06 MED ORDER — DOXYCYCLINE HYCLATE 100 MG PO CAPS: 100.0000 mg | ORAL_CAPSULE | Freq: Two times a day (BID) | ORAL | 0 refills | Status: DC

## 2018-12-06 MED ORDER — HYDROMORPHONE HCL 2 MG/ML IJ SOLN
2.0000 mg | Freq: Once | INTRAMUSCULAR | Status: AC
  Administered 2018-12-06: 2 mg via INTRAMUSCULAR
  Filled 2018-12-06: qty 1

## 2018-12-06 NOTE — ED Triage Notes (Signed)
Pt reports infection left middle finger for 2 weeks. Reports area started out as a "knick" and then progressed. Pain increased approx 3 am. Py took oxycodone

## 2018-12-06 NOTE — ED Provider Notes (Signed)
[facility] EMERGENCY DEPARTMENT Provider Note   CSN: [id number] Arrival date & time: 12/06/18  6440    History   Chief Complaint Chief Complaint  Patient presents with  . Hand Pain    HPI Elizabeth Baxter is a 43 y.o. female with a history significant for Lupus, Reynauds disease, anemia, GERD and is under chronic pain management presenting with worsening pain in her left distal long finger.  She reports a month ago having a Reynauds episode in this finger, describing a dusky finger tip which improved once she was back on the amlodipine which she had run out of.  She has continued healing of the fingertip describing gradual sloughing of the outer layer of skin and been applying hydrogen peroxide to this site.  Two weeks ago she cut the cuticle of this finger and developed an infection of the fingertip.  She woke with increased pain last night and when she squeezed the finger a small amount of pus came out from under the cuticle.  She denies fevers or chills.  She last took oxycodone 20 mg at 3 am today with no improvement in her fingertip pain.       The history is provided by the patient.    Past Medical History:  Diagnosis Date  . Chronic pain   . GERD (gastroesophageal reflux disease)   . Lupus (HCC)   . Pulmonary embolism [facility])     Patient Active Problem List   Diagnosis Date Noted  . SVT (supraventricular tachycardia) (HCC) 04/13/2018  . Vasculitis (HCC) 03/27/2015  . Exacerbation of systemic lupus (HCC)   . Hip pain   . Myalgia 03/26/2015  . Infection of index finger 03/26/2015  . Right hip pain 03/26/2015  . Lupus (HCC)   . Pulmonary embolism (HCC)   . Tachycardia   . Systemic lupus erythematosus (HCC) 01/20/2013  . Orthostatic hypotension 08/02/2012  . Normocytic anemia 07/31/2012  . Hypokalemia 07/29/2012  . Lupus erythematosus 07/29/2012  . Pancreatitis, acute 07/29/2012  . Colitis, acute 07/29/2012  . Dehydration 07/25/2012  . Nausea and vomiting 07/25/2012   . Hypotension 07/25/2012  . SLE exacerbation (HCC) 07/25/2012  . Chronic pain 07/25/2012  . GERD (gastroesophageal reflux disease) 07/25/2012  . History of pulmonary embolism 07/25/2012    Past Surgical History:  Procedure Laterality Date  . PERICARDIAL WINDOW     in 2007     OB History    Gravida  2   Para  1   Term  1   Preterm      AB  1   Living        SAB  1   TAB      Ectopic      Multiple      Live Births               Home Medications    Prior to Admission medications   Medication Sig Start Date End Date Taking? Authorizing Provider  amLODipine (NORVASC) 10 MG tablet Take 1 tablet (10 mg total) by mouth daily. 10/21/18   Eustace Moore, MD  cetirizine (ZYRTEC) 10 MG tablet Take by mouth.    [provider]  doxycycline (VIBRAMYCIN) 100 MG capsule Take 1 capsule (100 mg total) by mouth 2 (two) times daily. 12/06/18   Burgess Amor, PA-C  gabapentin (NEURONTIN) 300 MG capsule Take 1 capsule (300 mg total) by mouth 3 (three) times daily. 11/24/18   Eber Hong, MD  hydroxychloroquine (PLAQUENIL) 200 MG  tablet Take 200 mg by mouth 2 (two) times daily.    [provider]  metoprolol succinate (TOPROL-XL) 25 MG 24 hr tablet Take 0.5 tablets (12.5 mg total) by mouth daily. 03/26/18 04/25/18  Gerhard Munch, MD  mycophenolate (CELLCEPT) 500 MG tablet Take 1,500 mg by mouth 2 (two) times daily.     [provider]  Oxycodone HCl 20 MG TABS Take 1 tablet by mouth every 6 (six) hours as needed. 11/19/18   [provider]  predniSONE (DELTASONE) 20 MG tablet Take 1 tablet (20 mg total) by mouth daily with breakfast. 11/24/18   Eber Hong, MD  SUBOXONE 8-2 MG FILM Place 0.5-1 Film under the tongue See admin instructions. Dissolve 1 film in the mouth once a day and 0.5 film at bedtime 01/26/18   [provider]    Family History Family History  Problem Relation Age of Onset  . Diabetes Mother     Social History  Social History   Tobacco Use  . Smoking status: Never Smoker  . Smokeless tobacco: Never Used  Substance Use Topics  . Alcohol use: No  . Drug use: No    Comment: no IV Drug use in past     Allergies   Broccoli [brassica oleracea italica]; Imuran [azathioprine]; Tramadol; Tramadol; and Azathioprine   Review of Systems Review of Systems  Constitutional: Negative for chills and fever.  Musculoskeletal: Positive for arthralgias. Negative for joint swelling and myalgias.  Skin: Positive for color change and wound.  Neurological: Negative for weakness and numbness.     Physical Exam Updated Vital Signs BP (!) 122/94   Pulse 85   Temp 98.1 F (36.7 C) (Oral)   Resp 18   LMP 11/18/2018   SpO2 98%   Physical Exam Constitutional:      Appearance: She is well-developed.  HENT:     Head: Atraumatic.  Neck:     Musculoskeletal: Normal range of motion.  Cardiovascular:     Comments: Pulses equal bilaterally Musculoskeletal:        General: Tenderness present.     Left hand: She exhibits tenderness.     Comments: ttp left long finger distal phalanx.  Small scabbed area along fingertip along nail plate edge.  New desquamated skin noted finger tip pad, pink with less than 2 sec cap refill.  There is a small abrasion with yellow crusted purulence along the dorsal proximal cuticle.  No edema, no abscess/paronychia, no red streaking.   Skin:    General: Skin is warm and dry.  Neurological:     Mental Status: She is alert.     Sensory: No sensory deficit.     Deep Tendon Reflexes: Reflexes normal.      ED Treatments / Results  Labs (all labs ordered are listed, but only abnormal results are displayed) Labs Reviewed - No data to display  EKG None  Radiology Dg Finger Middle Left  Result Date: 12/06/2018 CLINICAL DATA:  Left middle finger infection. EXAM: LEFT MIDDLE FINGER 2+V COMPARISON:  None. FINDINGS: There is no evidence of fracture or dislocation. There is no  evidence of arthropathy or other focal bone abnormality. Soft tissues are unremarkable. No lytic destruction is seen to suggest osteomyelitis. IMPRESSION: Negative. Electronically Signed   By: Lupita Raider M.D.   On: 12/06/2018 09:45    Procedures Procedures (including critical care time)  Medications Ordered in ED Medications  HYDROmorphone (DILAUDID) injection 2 mg (2 mg Intramuscular Given 12/06/18 0928)  doxycycline (VIBRA-TABS) tablet 100 mg (100 mg Oral Given 12/06/18 0959)     Initial Impression / Assessment and Plan / ED Course  I have reviewed the triage vital signs and the nursing notes.  Pertinent labs & imaging results that were available during my care of the patient were reviewed by me and considered in my medical decision making (see chart for details).        Pt with superficial fingertip/cuticle infection with no abscess or paronychia present.  Xray reviewed, no osteomyelitis.  No vascular compromise noted in the finger at this time. Encouraged warm water epsom salt soaks, bid soap and water wash, avoid continued skin exposure to hydrogen peroxide.  Doxycycline. Return precautions discussed.   Final Clinical Impressions(s) / ED Diagnoses   Final diagnoses:  Superficial injury of finger, infected, initial encounter    ED Discharge Orders         Ordered    doxycycline (VIBRAMYCIN) 100 MG capsule  2 times daily,   Status:  Discontinued     12/06/18 0955    doxycycline (VIBRAMYCIN) 100 MG capsule  2 times daily     12/06/18 0959           Burgess Amordol, Lewellyn Fultz, PA-C 12/06/18 1024    Jacalyn LefevreHaviland, Zikeria Keough, MD 12/06/18 1032

## 2018-12-06 NOTE — Discharge Instructions (Addendum)
Your xray is normal today with no sign of infection involving the bone in your fingertip.  Take your next dose of the antibiotic this evening and complete the entire 10 day course.  Stop using the hydrogen peroxide as discussed as this can be delaying the healing of your old injury.  Warm epsom salt soak twice daily will help clear this infection.  Get rechecked for any worsening or spreading infection. It may take 48 hours before the antibiotic starts to significantly improve this infection.

## 2018-12-06 NOTE — ED Notes (Signed)
Middle noted to have dark dried skin around cuticle. No drainage noted

## 2019-07-11 ENCOUNTER — Other Ambulatory Visit: Payer: Self-pay | Admitting: Cardiology

## 2019-07-11 DIAGNOSIS — I471 Supraventricular tachycardia: Secondary | ICD-10-CM

## 2020-02-07 ENCOUNTER — Other Ambulatory Visit: Payer: Self-pay

## 2020-02-07 ENCOUNTER — Ambulatory Visit: Admission: EM | Admit: 2020-02-07 | Discharge: 2020-02-07 | Discharge status: Against Medical Advice

## 2020-06-13 IMAGING — CR DG CHEST 2V
2 series · 2 of 2 positions shown · non-contrast
Comparison: None.

CLINICAL DATA: Chest pain and dizziness.  Tachycardia

EXAM:
CHEST - 2 VIEW

[chest pa]
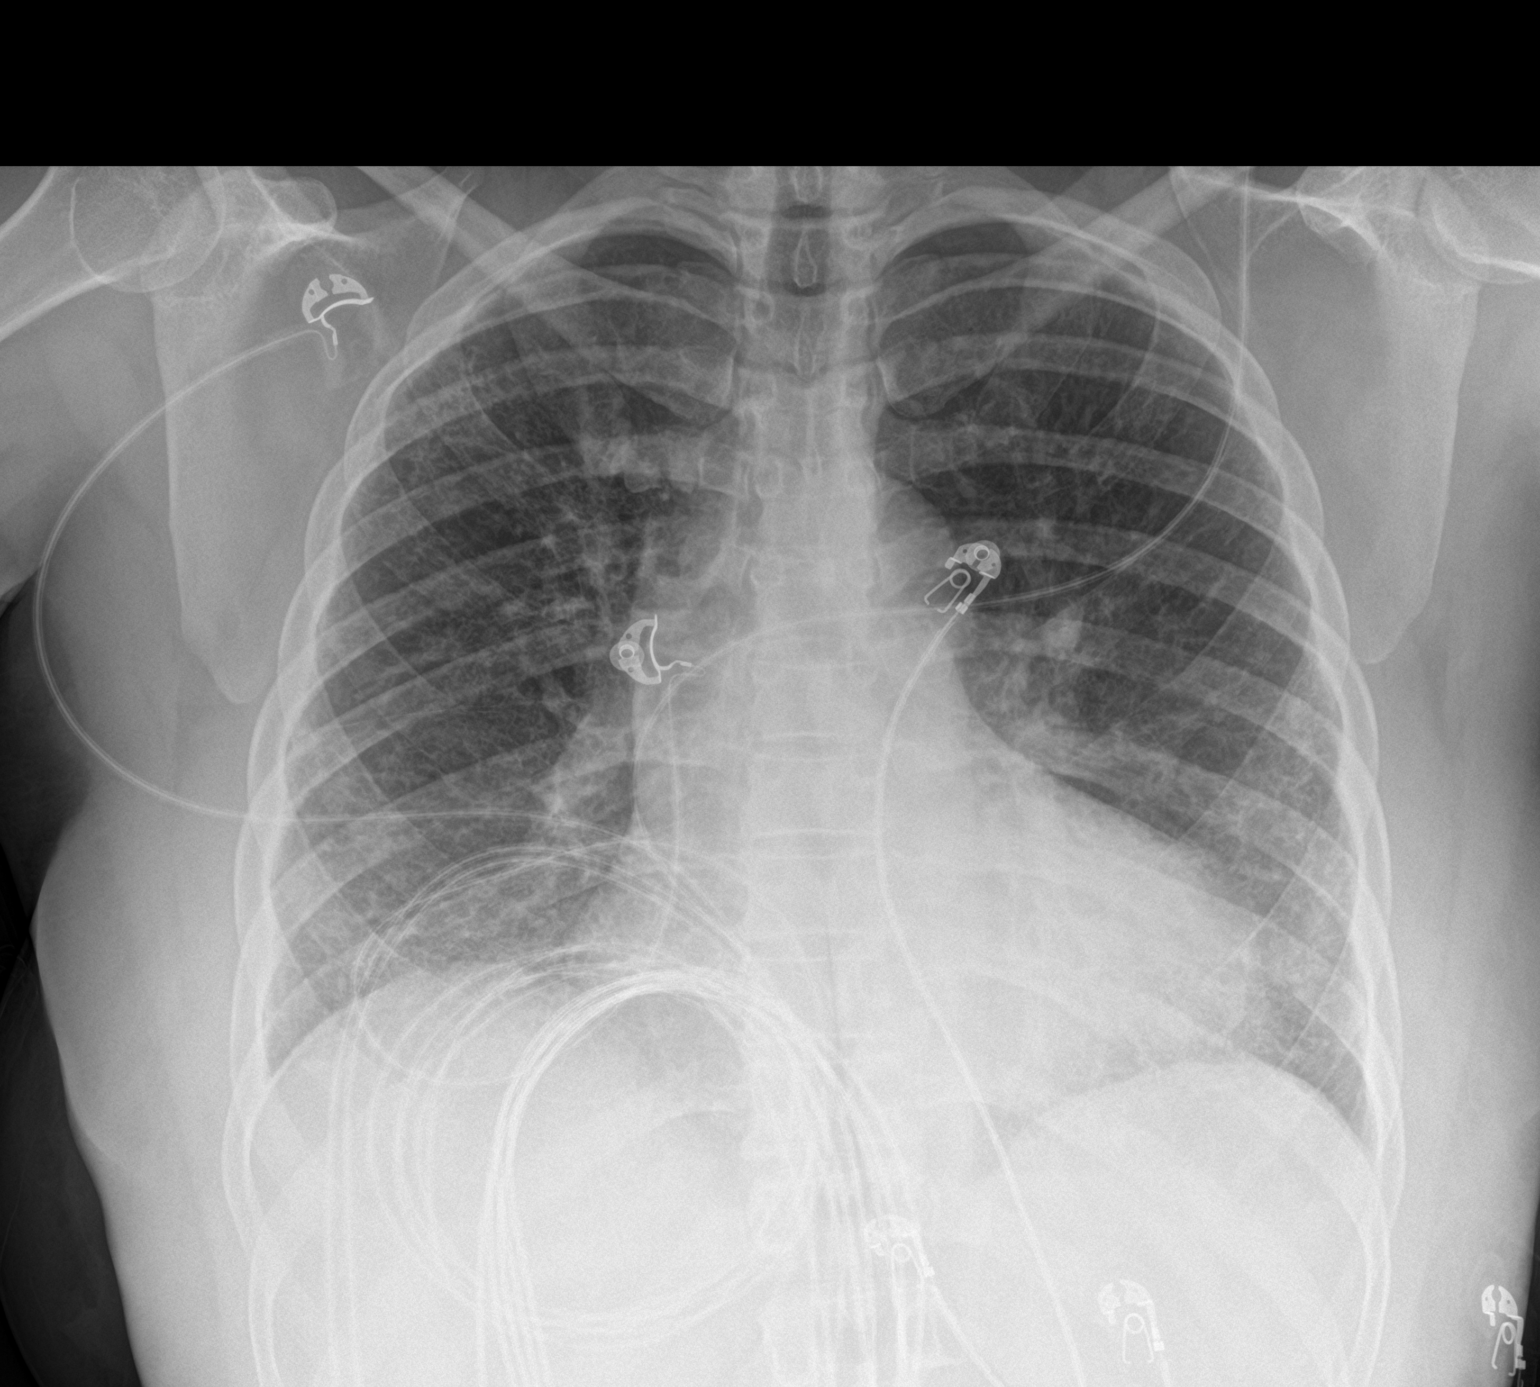

[chest lat]
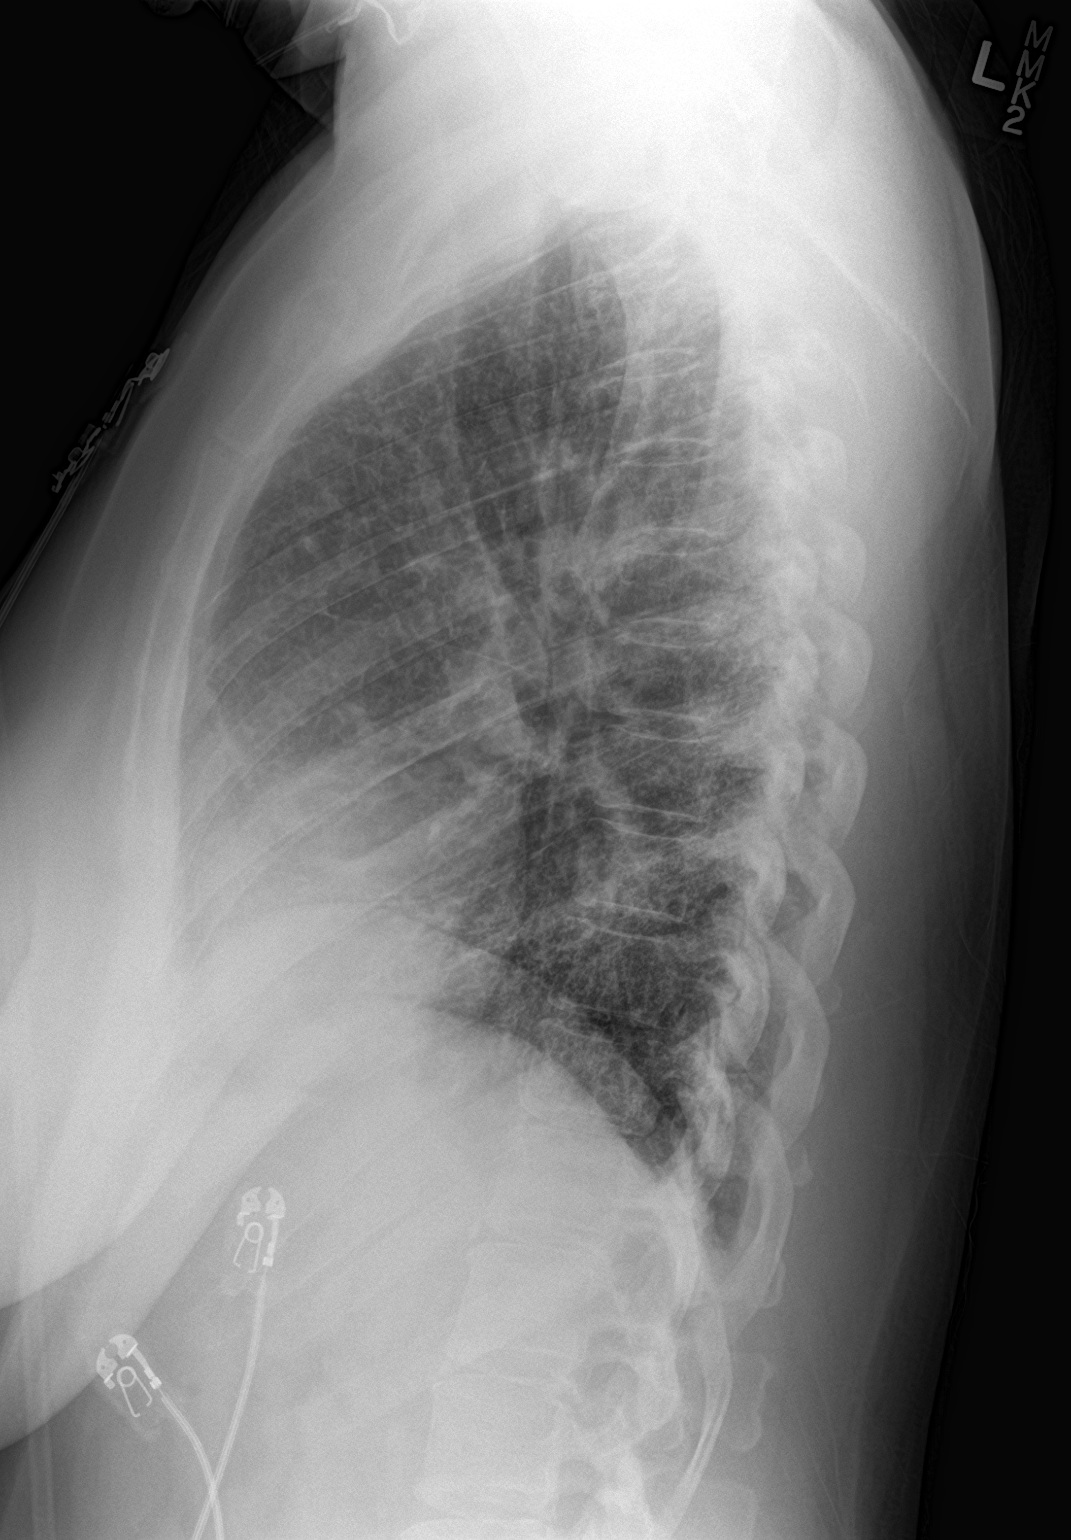

[2 of 2 positions shown; findings below may reference images not displayed]

FINDINGS: There is interstitial prominence in the lungs fairly diffusely,
likely fibrosis. No frank edema or consolidation. Heart is mildly
enlarged with pulmonary vascularity normal. No adenopathy. No bone
lesions.
IMPRESSION: Apparent fibrotic change in the lungs bilaterally. No frank edema or
consolidation. Mild cardiac enlargement. No adenopathy.

## 2021-01-16 ENCOUNTER — Ambulatory Visit (HOSPITAL_COMMUNITY)
Admission: EM | Admit: 2021-01-16 | Discharge: 2021-01-16 | Disposition: A | Discharge status: Home / Self Care | Attending: Physician Assistant | Admitting: Physician Assistant

## 2021-01-16 ENCOUNTER — Encounter (HOSPITAL_COMMUNITY): Payer: Self-pay | Admitting: Emergency Medicine

## 2021-01-16 DIAGNOSIS — R52 Pain, unspecified: Secondary | ICD-10-CM | POA: Diagnosis not present

## 2021-01-16 DIAGNOSIS — L03011 Cellulitis of right finger: Secondary | ICD-10-CM | POA: Diagnosis not present

## 2021-01-16 MED ORDER — MUPIROCIN 2 % EX OINT: 1.0000 "application " | TOPICAL_OINTMENT | Freq: Every day | CUTANEOUS | 0 refills | Status: AC

## 2021-01-16 MED ORDER — KETOROLAC TROMETHAMINE 30 MG/ML IJ SOLN
30.0000 mg | Freq: Once | INTRAMUSCULAR | Status: AC
  Administered 2021-01-16: 30 mg via INTRAMUSCULAR

## 2021-01-16 MED ORDER — KETOROLAC TROMETHAMINE 30 MG/ML IJ SOLN
INTRAMUSCULAR | Status: AC
  Filled 2021-01-16: qty 1

## 2021-01-16 MED ORDER — DOXYCYCLINE HYCLATE 100 MG PO CAPS: 100.0000 mg | ORAL_CAPSULE | Freq: Two times a day (BID) | ORAL | 0 refills | Status: AC

## 2021-01-16 NOTE — ED Triage Notes (Signed)
Pt is present today with an infection in her cuticle with the middle finger cuticle. Pt states that she cuts her own cuticles and thinks she may have cut too deep.  Pt states that this happened x1 month ago. Pt states that she is still experiencing pain and it is radiating up her arm

## 2021-01-16 NOTE — Discharge Instructions (Signed)
Call your pain management doctor to discuss additional pain management options.  You were given Toradol in office so please do not take NSAIDs (aspirin, ibuprofen/Advil, naproxen/Aleve) until tomorrow.  You can use Tylenol overnight.  Take doxycycline twice a day for 10 days.  This can make you sensitive to the sun so stay out of the sun while on this medication.  Use Bactroban at the cuticle.  If you have any worsening symptoms you need to return for reevaluation.

## 2021-01-16 NOTE — ED Provider Notes (Signed)
[facility]    CSN: [id number] Arrival date & time: 01/16/21  1653      History   Chief Complaint No chief complaint on file.   HPI Elizabeth Baxter is a 45 y.o. female.   Patient presents today with a 1 month history of intermittent right middle finger pain.  She has a history of lupus and is on several immune modulating medications and is therefore prone to infections.  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  She was able to express some purulent drainage and began soaking her finger in vinegar which provided temporary relief of symptoms.  Currently, pain is rated 9 on a 0-10 pain scale, localized to right middle finger with radiation into hand and arm, described as throbbing, worse with palpation, no alleviating factors identified.  She has tried over-the-counter medications without improvement of symptoms.  She is prescribed Suboxone but states this has been ineffective in managing her symptoms.  She denies any fevers, nausea, vomiting, numbness, paresthesias, decreased range of motion.  Denies any recent antibiotic use.  Reports she is right-handed.  She is having difficulty with daily activities as she frequently reinjured her hand while at work and is requesting work excuse note today.     Past Medical History:  Diagnosis Date  . Chronic pain   . GERD (gastroesophageal reflux disease)   . Lupus (HCC)   . Pulmonary embolism [facility])     Patient Active Problem List   Diagnosis Date Noted  . SVT (supraventricular tachycardia) (HCC) 04/13/2018  . Vasculitis (HCC) 03/27/2015  . Exacerbation of systemic lupus (HCC)   . Hip pain   . Myalgia 03/26/2015  . Infection of index finger 03/26/2015  . Right hip pain 03/26/2015  . Lupus (HCC)   . Pulmonary embolism (HCC)   . Tachycardia   . Systemic lupus erythematosus (HCC) 01/20/2013  . Orthostatic hypotension 08/02/2012  . Normocytic anemia 07/31/2012   . Hypokalemia 07/29/2012  . Lupus erythematosus 07/29/2012  . Pancreatitis, acute 07/29/2012  . Colitis, acute 07/29/2012  . Dehydration 07/25/2012  . Nausea and vomiting 07/25/2012  . Hypotension 07/25/2012  . SLE exacerbation (HCC) 07/25/2012  . Chronic pain 07/25/2012  . GERD (gastroesophageal reflux disease) 07/25/2012  . History of pulmonary embolism 07/25/2012    Past Surgical History:  Procedure Laterality Date  . PERICARDIAL WINDOW     in 2007    OB History    Gravida  2   Para  1   Term  1   Preterm      AB  1   Living        SAB  1   IAB      Ectopic      Multiple      Live Births               Home Medications    Prior to Admission medications   Medication Sig Start Date End Date Taking? Authorizing Provider  mupirocin ointment (BACTROBAN) 2 % Apply 1 application topically daily. 01/16/21  Yes Brigid Vandekamp K, PA-C  amLODipine (NORVASC) 10 MG tablet Take 1 tablet (10 mg total) by mouth daily. 10/21/18   Eustace Moore, MD  cetirizine (ZYRTEC) 10 MG tablet Take by mouth.    [provider]  doxycycline (VIBRAMYCIN) 100 MG capsule Take 1 capsule (100 mg total) by mouth 2 (two) times daily. 01/16/21  Trivia Heffelfinger K, PA-C  gabapentin (NEURONTIN) 300 MG capsule Take 1 capsule (300 mg total) by mouth 3 (three) times daily. 11/24/18   Eber Hong, MD  hydroxychloroquine (PLAQUENIL) 200 MG tablet Take 200 mg by mouth 2 (two) times daily.    [provider]  metoprolol succinate (TOPROL-XL) 25 MG 24 hr tablet Take 0.5 tablets (12.5 mg total) by mouth daily. 03/26/18 04/25/18  Gerhard Munch, MD  mycophenolate (CELLCEPT) 500 MG tablet Take 1,500 mg by mouth 2 (two) times daily.     [provider]  Oxycodone HCl 20 MG TABS Take 1 tablet by mouth every 6 (six) hours as needed. 11/19/18   [provider]  predniSONE (DELTASONE) 20 MG tablet Take 1 tablet (20 mg total) by mouth daily with breakfast. 11/24/18   Eber Hong, MD  SUBOXONE 8-2 MG FILM Place 0.5-1 Film under the tongue See admin instructions. Dissolve 1 film in the mouth once a day and 0.5 film at bedtime 01/26/18   [provider]    Family History Family History  Problem Relation Age of Onset  . Diabetes Mother     Social History Social History   Tobacco Use  . Smoking status: Never Smoker  . Smokeless tobacco: Never Used  Vaping Use  . Vaping Use: Never used  Substance Use Topics  . Alcohol use: No  . Drug use: No    Comment: no IV Drug use in past     Allergies   Broccoli [brassica oleracea], Imuran [azathioprine], Tramadol, Tramadol, and Azathioprine   Review of Systems Review of Systems  Constitutional: Negative for activity change, appetite change, fatigue and fever.  Respiratory: Negative for cough and shortness of breath.   Cardiovascular: Negative for chest pain.  Gastrointestinal: Negative for abdominal pain, diarrhea, nausea and vomiting.  Musculoskeletal: Negative for arthralgias and myalgias.  Skin: Positive for color change and wound.  Neurological: Negative for dizziness, weakness, light-headedness, numbness and headaches.     Physical Exam Triage Vital Signs ED Triage Vitals  Enc Vitals Group     BP 01/16/21 1731 120/80     Pulse Rate 01/16/21 1731 71     Resp 01/16/21 1731 17     Temp --      Temp src --      SpO2 01/16/21 1731 97 %     Weight --      Height --      Head Circumference --      Peak Flow --      Pain Score 01/16/21 1730 8     Pain Loc --      Pain Edu? --      Excl. in GC? --    No data found.  Updated Vital Signs BP 120/80   Pulse 71   Resp 17   SpO2 97%   Visual Acuity Right Eye Distance:   Left Eye Distance:   Bilateral Distance:    Right Eye Near:   Left Eye Near:    Bilateral Near:     Physical Exam Vitals reviewed.  Constitutional:      General: She is awake. She is not in acute distress.    Appearance: Normal appearance. She is normal  weight. She is not ill-appearing.     Comments: Very pleasant female appears stated age in no acute distress  HENT:     Head: Normocephalic and atraumatic.  Cardiovascular:     Rate and Rhythm: Normal rate and regular rhythm.  Pulses:          Radial pulses are 2+ on the right side and 2+ on the left side.     Heart sounds: Normal heart sounds. No murmur heard.   Pulmonary:     Effort: Pulmonary effort is normal.     Breath sounds: Normal breath sounds. No wheezing, rhonchi or rales.     Comments: Clear to auscultation bilaterally Musculoskeletal:     Right hand: Tenderness present. No bony tenderness. Normal range of motion. Normal strength. Normal sensation. Normal capillary refill.     Comments: Right hand: Normal active range of motion.  No deformity noted.  Tenderness palpation at right middle nailbed with associated erythema and edema.  No obvious purulent collection.  Hand neurovascularly intact.  Psychiatric:        Behavior: Behavior is cooperative.      UC Treatments / Results  Labs (all labs ordered are listed, but only abnormal results are displayed) Labs Reviewed - No data to display  EKG   Radiology No results found.  Procedures Procedures (including critical care time)  Medications Ordered in UC Medications  ketorolac (TORADOL) 30 MG/ML injection 30 mg (has no administration in time range)    Initial Impression / Assessment and Plan / UC Course  I have reviewed the triage vital signs and the nursing notes.  Pertinent labs & imaging results that were available during my care of the patient were reviewed by me and considered in my medical decision making (see chart for details).     Paronychia identified on exam.  Patient was started on doxycycline with instruction to avoid prolonged sun exposure due to photosensitivity associated with this medication.  She was prescribed Bactroban ointment to be applied at the cuticle.  Initially discussed potential  utility of opioid medication given severity of pain, however, patient is currently prescribed Suboxone so encouraged her to follow-up with her pain management provider to determine appropriate pain medication.  She was given Toradol in clinic and encouraged use over-the-counter analgesics for pain relief.  Discussed alarm symptoms that warrant emergent evaluation.  Strict return precautions given to which she expressed understanding.  Final Clinical Impressions(s) / UC Diagnoses   Final diagnoses:  Paronychia of right middle finger  Throbbing pain     Discharge Instructions     Call your pain management doctor to discuss additional pain management options.  You were given Toradol in office so please do not take NSAIDs (aspirin, ibuprofen/Advil, naproxen/Aleve) until tomorrow.  You can use Tylenol overnight.  Take doxycycline twice a day for 10 days.  This can make you sensitive to the sun so stay out of the sun while on this medication.  Use Bactroban at the cuticle.  If you have any worsening symptoms you need to return for reevaluation.    ED Prescriptions    Medication Sig Dispense Auth. Provider   doxycycline (VIBRAMYCIN) 100 MG capsule Take 1 capsule (100 mg total) by mouth 2 (two) times daily. 20 capsule Maureena Dabbs K, PA-C   mupirocin ointment (BACTROBAN) 2 % Apply 1 application topically daily. 22 g Elfego Giammarino K, PA-C     PDMP not reviewed this encounter.   Jeani Hawking, PA-C 01/16/21 1752

## 2022-05-09 ENCOUNTER — Ambulatory Visit
Admission: EM | Admit: 2022-05-09 | Discharge: 2022-05-09 | Disposition: A | Discharge status: Home / Self Care | Attending: Physician Assistant | Admitting: Physician Assistant

## 2022-05-09 DIAGNOSIS — J029 Acute pharyngitis, unspecified: Secondary | ICD-10-CM | POA: Diagnosis not present

## 2022-05-09 LAB — POCT RAPID STREP A (OFFICE): Rapid Strep A Screen: NEGATIVE

## 2022-05-09 MED ORDER — PREDNISONE 20 MG PO TABS: 40.0000 mg | ORAL_TABLET | Freq: Every day | ORAL | 0 refills | Status: AC

## 2022-05-09 NOTE — ED Triage Notes (Signed)
Pt presents with sore throat X 3 days.  

## 2022-05-09 NOTE — ED Provider Notes (Signed)
[facility]    CSN: [id number] Arrival date & time: 05/09/22  1501      History   Chief Complaint Chief Complaint  Patient presents with   Sore Throat    HPI Elizabeth Baxter is a 46 y.o. female.   Patient here today for evaluation of sore throat she had for 3 days.  She reports that she feels as if her throat is closing at times.  She has not had any cough but has had some nasal congestion and drainage.  She denies any fever.  She does not report treatment for symptoms.  The history is provided by the patient.  Sore Throat Pertinent negatives include no abdominal pain and no shortness of breath.    Past Medical History:  Diagnosis Date   Chronic pain    GERD (gastroesophageal reflux disease)    Lupus (HCC)    Pulmonary embolism (HCC)     Patient Active Problem List   Diagnosis Date Noted   SVT (supraventricular tachycardia) (HCC) 04/13/2018   Vasculitis (HCC) 03/27/2015   Exacerbation of systemic lupus (HCC)    Hip pain    Myalgia 03/26/2015   Infection of index finger 03/26/2015   Right hip pain 03/26/2015   Lupus (HCC)    Pulmonary embolism (HCC)    Tachycardia    Systemic lupus erythematosus (HCC) 01/20/2013   Orthostatic hypotension 08/02/2012   Normocytic anemia 07/31/2012   Hypokalemia 07/29/2012   Lupus erythematosus 07/29/2012   Pancreatitis, acute 07/29/2012   Colitis, acute 07/29/2012   Dehydration 07/25/2012   Nausea and vomiting 07/25/2012   Hypotension 07/25/2012   SLE exacerbation (HCC) 07/25/2012   Chronic pain 07/25/2012   GERD (gastroesophageal reflux disease) 07/25/2012   History of pulmonary embolism 07/25/2012    Past Surgical History:  Procedure Laterality Date   PERICARDIAL WINDOW     in 2007    OB History     Gravida  2   Para  1   Term  1   Preterm      AB  1   Living         SAB  1   IAB      Ectopic      Multiple      Live Births               Home Medications    Prior to  Admission medications   Medication Sig Start Date End Date Taking? Authorizing Provider  predniSONE (DELTASONE) 20 MG tablet Take 2 tablets (40 mg total) by mouth daily with breakfast for 5 days. 05/09/22 05/14/22 Yes Tomi Bamberger, PA-C  amLODipine (NORVASC) 10 MG tablet Take 1 tablet (10 mg total) by mouth daily. 10/21/18   Eustace Moore, MD  cetirizine (ZYRTEC) 10 MG tablet Take by mouth.    [provider]  doxycycline (VIBRAMYCIN) 100 MG capsule Take 1 capsule (100 mg total) by mouth 2 (two) times daily. 01/16/21   Raspet, Noberto Retort, PA-C  gabapentin (NEURONTIN) 300 MG capsule Take 1 capsule (300 mg total) by mouth 3 (three) times daily. 11/24/18   Eber Hong, MD  hydroxychloroquine (PLAQUENIL) 200 MG tablet Take 200 mg by mouth 2 (two) times daily.    [provider]  metoprolol succinate (TOPROL-XL) 25 MG 24 hr tablet Take 0.5 tablets (12.5 mg total) by mouth daily. 03/26/18 04/25/18  Gerhard Munch, MD  mupirocin ointment (BACTROBAN) 2 % Apply 1 application topically daily. 01/16/21   Raspet, Denny Peon  K, PA-C  mycophenolate (CELLCEPT) 500 MG tablet Take 1,500 mg by mouth 2 (two) times daily.     [provider]  Oxycodone HCl 20 MG TABS Take 1 tablet by mouth every 6 (six) hours as needed. 11/19/18   [provider]  SUBOXONE 8-2 MG FILM Place 0.5-1 Film under the tongue See admin instructions. Dissolve 1 film in the mouth once a day and 0.5 film at bedtime 01/26/18   [provider]    Family History Family History  Problem Relation Age of Onset   Diabetes Mother     Social History Social History   Tobacco Use   Smoking status: Never   Smokeless tobacco: Never  Vaping Use   Vaping Use: Never used  Substance Use Topics   Alcohol use: No   Drug use: No    Comment: no IV Drug use in past     Allergies   Broccoli [brassica oleracea], Imuran [azathioprine], Tramadol, Tramadol, and Azathioprine   Review of Systems Review of Systems   Constitutional:  Negative for chills and fever.  HENT:  Positive for congestion and sore throat. Negative for ear pain.   Eyes:  Negative for discharge and redness.  Respiratory:  Negative for cough, shortness of breath and wheezing.   Gastrointestinal:  Negative for abdominal pain, diarrhea, nausea and vomiting.     Physical Exam Triage Vital Signs ED Triage Vitals  Enc Vitals Group     BP 05/09/22 1539 117/81     Pulse Rate 05/09/22 1539 82     Resp 05/09/22 1539 17     Temp 05/09/22 1539 98.3 F (36.8 C)     Temp Source 05/09/22 1539 Oral     SpO2 05/09/22 1539 98 %     Weight --      Height --      Head Circumference --      Peak Flow --      Pain Score 05/09/22 1538 7     Pain Loc --      Pain Edu? --      Excl. in [place]? --    No data found.  Updated Vital Signs BP 117/81 (BP Location: Left Arm)   Pulse 82   Temp 98.3 F (36.8 C) (Oral)   Resp 17   LMP 05/06/2022   SpO2 98%      Physical Exam Vitals and nursing note reviewed.  Constitutional:      General: She is not in acute distress.    Appearance: Normal appearance. She is not ill-appearing.  HENT:     Head: Normocephalic and atraumatic.     Nose: Congestion present.     Mouth/Throat:     Mouth: Mucous membranes are moist.     Pharynx: Posterior oropharyngeal erythema present. No oropharyngeal exudate.  Eyes:     Conjunctiva/sclera: Conjunctivae normal.  Cardiovascular:     Rate and Rhythm: Normal rate.  Pulmonary:     Effort: Pulmonary effort is normal. No respiratory distress.  Skin:    General: Skin is warm and dry.  Neurological:     Mental Status: She is alert.  Psychiatric:        Mood and Affect: Mood normal.        Thought Content: Thought content normal.      UC Treatments / Results  Labs (all labs ordered are listed, but only abnormal results are displayed) Labs Reviewed  CULTURE, GROUP A STREP [facility])  POCT RAPID STREP A (  OFFICE)    EKG   Radiology No results  found.  Procedures Procedures (including critical care time)  Medications Ordered in UC Medications - No data to display  Initial Impression / Assessment and Plan / UC Course  I have reviewed the triage vital signs and the nursing notes.  Pertinent labs & imaging results that were available during my care of the patient were reviewed by me and considered in my medical decision making (see chart for details).    Steroid burst prescribed to cover inflammatory causes versus allergies of sore throat.  Negative rapid strep test, throat culture ordered.  Encouraged follow-up with any further concerns or worsening symptoms.  Final Clinical Impressions(s) / UC Diagnoses   Final diagnoses:  Acute pharyngitis, unspecified etiology   Discharge Instructions   None    ED Prescriptions     Medication Sig Dispense Auth. Provider   predniSONE (DELTASONE) 20 MG tablet Take 2 tablets (40 mg total) by mouth daily with breakfast for 5 days. 10 tablet Tomi Bamberger, PA-C      PDMP not reviewed this encounter.   Tomi Bamberger, PA-C 05/09/22 1605

## 2022-05-12 LAB — CULTURE, GROUP A STREP (THRC)

## 2022-06-05 ENCOUNTER — Encounter: Payer: Self-pay | Admitting: Emergency Medicine

## 2022-06-05 ENCOUNTER — Ambulatory Visit
Admission: EM | Admit: 2022-06-05 | Discharge: 2022-06-05 | Disposition: A | Discharge status: Home / Self Care | Attending: Internal Medicine | Admitting: Internal Medicine

## 2022-06-05 DIAGNOSIS — J069 Acute upper respiratory infection, unspecified: Secondary | ICD-10-CM | POA: Diagnosis not present

## 2022-06-05 DIAGNOSIS — U071 COVID-19: Secondary | ICD-10-CM | POA: Diagnosis not present

## 2022-06-05 DIAGNOSIS — J029 Acute pharyngitis, unspecified: Secondary | ICD-10-CM | POA: Diagnosis not present

## 2022-06-05 LAB — RESP PANEL BY RT-PCR (FLU A&B, COVID) ARPGX2
Influenza A by PCR: NEGATIVE
Influenza B by PCR: NEGATIVE
SARS Coronavirus 2 by RT PCR: POSITIVE — AB

## 2022-06-05 LAB — POCT RAPID STREP A (OFFICE): Rapid Strep A Screen: NEGATIVE

## 2022-06-05 MED ORDER — BENZONATATE 100 MG PO CAPS: 100.0000 mg | ORAL_CAPSULE | Freq: Three times a day (TID) | ORAL | 0 refills | Status: AC | PRN

## 2022-06-05 MED ORDER — FLUTICASONE PROPIONATE 50 MCG/ACT NA SUSP: 1.0000 | Freq: Every day | NASAL | 0 refills | Status: AC

## 2022-06-05 NOTE — ED Provider Notes (Signed)
[facility]    CSN: [id number] Arrival date & time: 06/05/22  0903      History   Chief Complaint Chief Complaint  Patient presents with   Nasal Congestion   Cough    HPI Elizabeth Baxter is a 46 y.o. female.   Patient presents with nasal congestion, cough, sore throat, generalized body aches, chills that started about 3 days ago.  Denies any known sick contacts.  Denies any documented fevers with thermometer but does report that she "felt feverish".  Has taken several over-the-counter cold and flu medications with minimal improvement of symptoms.  Denies chest pain, shortness of breath, ear pain, nausea, vomiting, diarrhea, abdominal pain.   Cough   Past Medical History:  Diagnosis Date   Chronic pain    GERD (gastroesophageal reflux disease)    Lupus (HCC)    Pulmonary embolism (HCC)     Patient Active Problem List   Diagnosis Date Noted   SVT (supraventricular tachycardia) 04/13/2018   Vasculitis (HCC) 03/27/2015   Exacerbation of systemic lupus (HCC)    Hip pain    Myalgia 03/26/2015   Infection of index finger 03/26/2015   Right hip pain 03/26/2015   Lupus (HCC)    Pulmonary embolism (HCC)    Tachycardia    Systemic lupus erythematosus (HCC) 01/20/2013   Orthostatic hypotension 08/02/2012   Normocytic anemia 07/31/2012   Hypokalemia 07/29/2012   Lupus erythematosus 07/29/2012   Pancreatitis, acute 07/29/2012   Colitis, acute 07/29/2012   Dehydration 07/25/2012   Nausea and vomiting 07/25/2012   Hypotension 07/25/2012   SLE exacerbation (HCC) 07/25/2012   Chronic pain 07/25/2012   GERD (gastroesophageal reflux disease) 07/25/2012   History of pulmonary embolism 07/25/2012    Past Surgical History:  Procedure Laterality Date   PERICARDIAL WINDOW     in 2007    OB History     Gravida  2   Para  1   Term  1   Preterm      AB  1   Living         SAB  1   IAB      Ectopic      Multiple      Live Births                Home Medications    Prior to Admission medications   Medication Sig Start Date End Date Taking? Authorizing Provider  benzonatate (TESSALON) 100 MG capsule Take 1 capsule (100 mg total) by mouth every 8 (eight) hours as needed for cough. 06/05/22  Yes Dominyck Reser, Rolly Salter E, FNP  fluticasone (FLONASE) 50 MCG/ACT nasal spray Place 1 spray into both nostrils daily. 06/05/22  Yes Lynden Carrithers, Rolly Salter E, FNP  amLODipine (NORVASC) 10 MG tablet Take 1 tablet (10 mg total) by mouth daily. 10/21/18   Eustace Moore, MD  cetirizine (ZYRTEC) 10 MG tablet Take by mouth.    [provider]  doxycycline (VIBRAMYCIN) 100 MG capsule Take 1 capsule (100 mg total) by mouth 2 (two) times daily. 01/16/21   Raspet, Noberto Retort, PA-C  gabapentin (NEURONTIN) 300 MG capsule Take 1 capsule (300 mg total) by mouth 3 (three) times daily. 11/24/18   Eber Hong, MD  hydroxychloroquine (PLAQUENIL) 200 MG tablet Take 200 mg by mouth 2 (two) times daily.    [provider]  metoprolol succinate (TOPROL-XL) 25 MG 24 hr tablet Take 0.5 tablets (12.5 mg total) by mouth daily. 03/26/18 04/25/18  Gerhard Munch, MD  mupirocin ointment (BACTROBAN) 2 % Apply 1 application topically daily. 01/16/21   Raspet, Noberto Retort, PA-C  mycophenolate (CELLCEPT) 500 MG tablet Take 1,500 mg by mouth 2 (two) times daily.     [provider]  Oxycodone HCl 20 MG TABS Take 1 tablet by mouth every 6 (six) hours as needed. 11/19/18   [provider]  SUBOXONE 8-2 MG FILM Place 0.5-1 Film under the tongue See admin instructions. Dissolve 1 film in the mouth once a day and 0.5 film at bedtime 01/26/18   [provider]    Family History Family History  Problem Relation Age of Onset   Diabetes Mother     Social History Social History   Tobacco Use   Smoking status: Never   Smokeless tobacco: Never  Vaping Use   Vaping Use: Never used  Substance Use Topics   Alcohol use: No   Drug use: No    Comment: no IV Drug  use in past     Allergies   Broccoli [brassica oleracea], Imuran [azathioprine], Tramadol, Tramadol, and Azathioprine   Review of Systems Review of Systems Per HPI  Physical Exam Triage Vital Signs ED Triage Vitals  Enc Vitals Group     BP 06/05/22 0955 120/89     Pulse Rate 06/05/22 0955 68     Resp 06/05/22 0955 18     Temp 06/05/22 0955 97.9 F (36.6 C)     Temp src --      SpO2 06/05/22 0955 97 %     Weight --      Height --      Head Circumference --      Peak Flow --      Pain Score 06/05/22 0954 0     Pain Loc --      Pain Edu? --      Excl. in [place]? --    No data found.  Updated Vital Signs BP 120/89   Pulse 68   Temp 97.9 F (36.6 C)   Resp 18   LMP 05/06/2022   SpO2 97%   Visual Acuity Right Eye Distance:   Left Eye Distance:   Bilateral Distance:    Right Eye Near:   Left Eye Near:    Bilateral Near:     Physical Exam Constitutional:      General: She is not in acute distress.    Appearance: Normal appearance. She is not toxic-appearing or diaphoretic.  HENT:     Head: Normocephalic and atraumatic.     Right Ear: Tympanic membrane and ear canal normal.     Left Ear: Tympanic membrane and ear canal normal.     Nose: Congestion present.     Mouth/Throat:     Mouth: Mucous membranes are moist.     Pharynx: Posterior oropharyngeal erythema present.  Eyes:     Extraocular Movements: Extraocular movements intact.     Conjunctiva/sclera: Conjunctivae normal.     Pupils: Pupils are equal, round, and reactive to light.  Cardiovascular:     Rate and Rhythm: Normal rate and regular rhythm.     Pulses: Normal pulses.     Heart sounds: Normal heart sounds.  Pulmonary:     Effort: Pulmonary effort is normal. No respiratory distress.     Breath sounds: Normal breath sounds. No stridor. No wheezing, rhonchi or rales.  Abdominal:     General: Abdomen is flat. Bowel sounds are normal.     Palpations: Abdomen is soft.  Musculoskeletal:         General: Normal range of motion.     Cervical back: Normal range of motion.  Skin:    General: Skin is warm and dry.  Neurological:     General: No focal deficit present.     Mental Status: She is alert and oriented to person, place, and time. Mental status is at baseline.  Psychiatric:        Mood and Affect: Mood normal.        Behavior: Behavior normal.      UC Treatments / Results  Labs (all labs ordered are listed, but only abnormal results are displayed) Labs Reviewed  CULTURE, GROUP A STREP ([place])  RESP PANEL BY RT-PCR (FLU A&B, COVID) [id number]  POCT RAPID STREP A (OFFICE)    EKG   Radiology No results found.  Procedures Procedures (including critical care time)  Medications Ordered in UC Medications - No data to display  Initial Impression / Assessment and Plan / UC Course  I have reviewed the triage vital signs and the nursing notes.  Pertinent labs & imaging results that were available during my care of the patient were reviewed by me and considered in my medical decision making (see chart for details).     Patient presents with symptoms likely from a viral upper respiratory infection. Differential includes bacterial pneumonia, sinusitis, allergic rhinitis, COVID-19, flu, RSV. Do not suspect underlying cardiopulmonary process. Symptoms seem unlikely related to ACS, CHF or COPD exacerbations, pneumonia, pneumothorax. Patient is nontoxic appearing and not in need of emergent medical intervention.  Rapid strep was negative.  Throat culture, COVID-19, flu test pending.  Recommended symptom control with over the counter medications.  Patient sent prescriptions to help alleviate symptoms.  Return if symptoms fail to improve in 1-2 weeks or you develop shortness of breath, chest pain, severe headache. Patient states understanding and is agreeable.  Discharged with PCP followup.  Final Clinical Impressions(s) / UC Diagnoses   Final diagnoses:  Viral upper  respiratory tract infection with cough  Sore throat     Discharge Instructions      Your rapid strep test was negative.  COVID and flu test are pending.  We will call if they are positive.  It appears that you have a viral illness that is causing your symptoms that should self resolve with symptomatic treatment.  I have prescribed you 2 medications to help alleviate symptoms.  Please follow-up if symptoms persist or worsen.     ED Prescriptions     Medication Sig Dispense Auth. Provider   benzonatate (TESSALON) 100 MG capsule Take 1 capsule (100 mg total) by mouth every 8 (eight) hours as needed for cough. 21 capsule [place], [place] E, [place]   fluticasone [facility]) 50 MCG/ACT nasal spray Place 1 spray into both nostrils daily. 16 g Teodora Medici, [place]      PDMP not reviewed this encounter.   Teodora Medici, [place] 06/05/22 1049

## 2022-06-05 NOTE — Discharge Instructions (Signed)
Your rapid strep test was negative.  COVID and flu test are pending.  We will call if they are positive.  It appears that you have a viral illness that is causing your symptoms that should self resolve with symptomatic treatment.  I have prescribed you 2 medications to help alleviate symptoms.  Please follow-up if symptoms persist or worsen.

## 2022-06-05 NOTE — ED Triage Notes (Signed)
Pt is present today with c/o cough and congestion x3 days ago

## 2022-06-08 LAB — CULTURE, GROUP A STREP (THRC)

## 2023-07-04 ENCOUNTER — Emergency Department (HOSPITAL_COMMUNITY)
Admission: EM | Admit: 2023-07-04 | Discharge: 2023-07-04 | Disposition: A | Discharge status: Home / Self Care | Attending: Emergency Medicine | Admitting: Emergency Medicine

## 2023-07-04 ENCOUNTER — Other Ambulatory Visit: Payer: Self-pay

## 2023-07-04 ENCOUNTER — Emergency Department (HOSPITAL_COMMUNITY)

## 2023-07-04 DIAGNOSIS — S299XXA Unspecified injury of thorax, initial encounter: Secondary | ICD-10-CM | POA: Diagnosis present

## 2023-07-04 DIAGNOSIS — Y92410 Unspecified street and highway as the place of occurrence of the external cause: Secondary | ICD-10-CM | POA: Diagnosis not present

## 2023-07-04 DIAGNOSIS — S60222A Contusion of left hand, initial encounter: Secondary | ICD-10-CM | POA: Insufficient documentation

## 2023-07-04 DIAGNOSIS — S20212A Contusion of left front wall of thorax, initial encounter: Secondary | ICD-10-CM | POA: Insufficient documentation

## 2023-07-04 DIAGNOSIS — I10 Essential (primary) hypertension: Secondary | ICD-10-CM | POA: Diagnosis not present

## 2023-07-04 DIAGNOSIS — Z79899 Other long term (current) drug therapy: Secondary | ICD-10-CM | POA: Insufficient documentation

## 2023-07-04 MED ORDER — CYCLOBENZAPRINE HCL 5 MG PO TABS: 5.0000 mg | ORAL_TABLET | Freq: Three times a day (TID) | ORAL | 0 refills | Status: AC | PRN

## 2023-07-04 MED ORDER — HYDROMORPHONE HCL 1 MG/ML IJ SOLN
1.0000 mg | Freq: Once | INTRAMUSCULAR | Status: AC
  Administered 2023-07-04: 1 mg via INTRAMUSCULAR
  Filled 2023-07-04: qty 1

## 2023-07-04 NOTE — ED Notes (Signed)
Pt states if she has to be d/c with pain medicine she doesn't want oxycodone or tramadol.

## 2023-07-04 NOTE — Discharge Instructions (Signed)
As we discussed, your x-rays did not show any fracture.  Take Tylenol or Motrin for pain and Flexeril for muscle spasm  See your doctor for follow-up  Return to ER if you have worse chest pain or arm pain or abdominal pain

## 2023-07-04 NOTE — ED Notes (Signed)
EDP at bedside  
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
## 2023-07-04 NOTE — ED Notes (Signed)
Patient transported to X-ray 
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
